# Patient Record
Sex: Female | Born: 1970
Health system: Southern US, Community
[De-identification: ages and names within clinical notes are randomized; demographics above are authoritative.]

## PROBLEM LIST (undated history)

## (undated) DIAGNOSIS — T4145XA Adverse effect of unspecified anesthetic, initial encounter: Secondary | ICD-10-CM

## (undated) DIAGNOSIS — R112 Nausea with vomiting, unspecified: Secondary | ICD-10-CM

## (undated) DIAGNOSIS — T8859XA Other complications of anesthesia, initial encounter: Secondary | ICD-10-CM

## (undated) DIAGNOSIS — Z9889 Other specified postprocedural states: Secondary | ICD-10-CM

## (undated) DIAGNOSIS — E559 Vitamin D deficiency, unspecified: Secondary | ICD-10-CM

## (undated) DIAGNOSIS — G43909 Migraine, unspecified, not intractable, without status migrainosus: Secondary | ICD-10-CM

## (undated) DIAGNOSIS — D329 Benign neoplasm of meninges, unspecified: Secondary | ICD-10-CM

## (undated) DIAGNOSIS — Z8489 Family history of other specified conditions: Secondary | ICD-10-CM

## (undated) DIAGNOSIS — S83289A Other tear of lateral meniscus, current injury, unspecified knee, initial encounter: Secondary | ICD-10-CM

## (undated) DIAGNOSIS — G35 Multiple sclerosis: Secondary | ICD-10-CM

## (undated) DIAGNOSIS — I493 Ventricular premature depolarization: Secondary | ICD-10-CM

## (undated) DIAGNOSIS — M2242 Chondromalacia patellae, left knee: Secondary | ICD-10-CM

## (undated) HISTORY — PX: WISDOM TOOTH EXTRACTION: SHX21

## (undated) HISTORY — PX: AUGMENTATION MAMMAPLASTY: SUR837

## (undated) HISTORY — DX: Benign neoplasm of meninges, unspecified: D32.9

## (undated) HISTORY — PX: BREAST ENHANCEMENT SURGERY: SHX7

## (undated) HISTORY — DX: Multiple sclerosis: G35

## (undated) HISTORY — DX: Vitamin D deficiency, unspecified: E55.9

## (undated) HISTORY — DX: Ventricular premature depolarization: I49.3

---

## 1997-07-19 ENCOUNTER — Ambulatory Visit (HOSPITAL_COMMUNITY): Admission: RE | Admit: 1997-07-19 | Discharge: 1997-07-19 | Payer: Self-pay

## 1997-11-03 ENCOUNTER — Ambulatory Visit (HOSPITAL_COMMUNITY): Admission: RE | Admit: 1997-11-03 | Discharge: 1997-11-03 | Payer: Self-pay | Admitting: Family Medicine

## 1998-01-05 ENCOUNTER — Ambulatory Visit (HOSPITAL_COMMUNITY): Admission: RE | Admit: 1998-01-05 | Discharge: 1998-01-05 | Payer: Self-pay

## 1999-03-09 ENCOUNTER — Encounter: Payer: Self-pay | Admitting: *Deleted

## 1999-03-09 ENCOUNTER — Ambulatory Visit (HOSPITAL_COMMUNITY): Admission: RE | Admit: 1999-03-09 | Discharge: 1999-03-09 | Payer: Self-pay | Admitting: *Deleted

## 1999-09-03 ENCOUNTER — Ambulatory Visit (HOSPITAL_COMMUNITY): Admission: RE | Admit: 1999-09-03 | Discharge: 1999-09-03 | Payer: Self-pay | Admitting: Neurosurgery

## 1999-09-03 ENCOUNTER — Encounter: Payer: Self-pay | Admitting: Neurosurgery

## 1999-11-08 ENCOUNTER — Ambulatory Visit (HOSPITAL_COMMUNITY): Admission: RE | Admit: 1999-11-08 | Discharge: 1999-11-08 | Payer: Self-pay

## 1999-11-14 ENCOUNTER — Other Ambulatory Visit: Admission: RE | Admit: 1999-11-14 | Discharge: 1999-11-14 | Payer: Self-pay | Admitting: Obstetrics & Gynecology

## 1999-12-01 ENCOUNTER — Ambulatory Visit (HOSPITAL_COMMUNITY): Admission: RE | Admit: 1999-12-01 | Discharge: 1999-12-01 | Payer: Self-pay | Admitting: Obstetrics & Gynecology

## 1999-12-01 ENCOUNTER — Encounter (INDEPENDENT_AMBULATORY_CARE_PROVIDER_SITE_OTHER): Payer: Self-pay

## 2000-02-01 ENCOUNTER — Ambulatory Visit (HOSPITAL_COMMUNITY): Admission: RE | Admit: 2000-02-01 | Discharge: 2000-02-01 | Payer: Self-pay | Admitting: *Deleted

## 2000-05-22 ENCOUNTER — Encounter (INDEPENDENT_AMBULATORY_CARE_PROVIDER_SITE_OTHER): Payer: Self-pay

## 2000-05-22 ENCOUNTER — Inpatient Hospital Stay (HOSPITAL_COMMUNITY): Admission: RE | Admit: 2000-05-22 | Discharge: 2000-05-25 | Payer: Self-pay | Admitting: Obstetrics & Gynecology

## 2000-05-22 HISTORY — PX: TOTAL VAGINAL HYSTERECTOMY: SHX2548

## 2000-06-26 ENCOUNTER — Ambulatory Visit (HOSPITAL_COMMUNITY): Admission: RE | Admit: 2000-06-26 | Discharge: 2000-06-26 | Payer: Self-pay | Admitting: Psychiatry

## 2000-06-26 ENCOUNTER — Encounter: Payer: Self-pay | Admitting: Psychiatry

## 2001-01-15 ENCOUNTER — Encounter: Payer: Self-pay | Admitting: Psychiatry

## 2001-01-15 ENCOUNTER — Ambulatory Visit (HOSPITAL_COMMUNITY): Admission: RE | Admit: 2001-01-15 | Discharge: 2001-01-15 | Payer: Self-pay | Admitting: Psychiatry

## 2002-06-28 ENCOUNTER — Ambulatory Visit (HOSPITAL_COMMUNITY): Admission: RE | Admit: 2002-06-28 | Discharge: 2002-06-28 | Payer: Self-pay | Admitting: Psychiatry

## 2002-06-28 ENCOUNTER — Encounter: Payer: Self-pay | Admitting: Psychiatry

## 2002-09-13 ENCOUNTER — Encounter: Payer: Self-pay | Admitting: Gastroenterology

## 2002-09-13 ENCOUNTER — Ambulatory Visit (HOSPITAL_COMMUNITY): Admission: RE | Admit: 2002-09-13 | Discharge: 2002-09-13 | Payer: Self-pay | Admitting: Gastroenterology

## 2002-09-22 ENCOUNTER — Encounter: Payer: Self-pay | Admitting: Gastroenterology

## 2002-09-22 ENCOUNTER — Ambulatory Visit (HOSPITAL_COMMUNITY): Admission: RE | Admit: 2002-09-22 | Discharge: 2002-09-22 | Payer: Self-pay | Admitting: Gastroenterology

## 2003-07-14 ENCOUNTER — Ambulatory Visit (HOSPITAL_COMMUNITY): Admission: RE | Admit: 2003-07-14 | Discharge: 2003-07-14 | Payer: Self-pay | Admitting: Psychiatry

## 2004-04-11 ENCOUNTER — Ambulatory Visit (HOSPITAL_COMMUNITY): Admission: RE | Admit: 2004-04-11 | Discharge: 2004-04-11 | Payer: Self-pay

## 2004-04-16 ENCOUNTER — Emergency Department (HOSPITAL_COMMUNITY): Admission: EM | Admit: 2004-04-16 | Discharge: 2004-04-16 | Payer: Self-pay | Admitting: Emergency Medicine

## 2004-05-25 ENCOUNTER — Ambulatory Visit (HOSPITAL_COMMUNITY): Admission: RE | Admit: 2004-05-25 | Discharge: 2004-05-25 | Payer: Self-pay | Admitting: Gastroenterology

## 2004-06-07 ENCOUNTER — Ambulatory Visit (HOSPITAL_COMMUNITY): Admission: RE | Admit: 2004-06-07 | Discharge: 2004-06-07 | Payer: Self-pay | Admitting: Internal Medicine

## 2004-10-09 ENCOUNTER — Ambulatory Visit (HOSPITAL_COMMUNITY): Admission: RE | Admit: 2004-10-09 | Discharge: 2004-10-09 | Payer: Self-pay | Admitting: Gastroenterology

## 2004-12-07 ENCOUNTER — Encounter: Payer: Self-pay | Admitting: Family Medicine

## 2005-03-11 ENCOUNTER — Ambulatory Visit (HOSPITAL_COMMUNITY): Admission: RE | Admit: 2005-03-11 | Discharge: 2005-03-11 | Payer: Self-pay | Admitting: Psychiatry

## 2005-03-21 ENCOUNTER — Ambulatory Visit (HOSPITAL_COMMUNITY): Admission: RE | Admit: 2005-03-21 | Discharge: 2005-03-21 | Payer: Self-pay | Admitting: Psychiatry

## 2006-03-24 ENCOUNTER — Ambulatory Visit (HOSPITAL_COMMUNITY): Admission: RE | Admit: 2006-03-24 | Discharge: 2006-03-24 | Payer: Self-pay | Admitting: Psychiatry

## 2006-05-08 ENCOUNTER — Emergency Department (HOSPITAL_COMMUNITY): Admission: EM | Admit: 2006-05-08 | Discharge: 2006-05-08 | Payer: Self-pay | Admitting: Emergency Medicine

## 2006-06-04 ENCOUNTER — Encounter: Admission: RE | Admit: 2006-06-04 | Discharge: 2006-06-17 | Payer: Self-pay | Admitting: Orthopedic Surgery

## 2006-12-01 ENCOUNTER — Ambulatory Visit (HOSPITAL_COMMUNITY): Admission: RE | Admit: 2006-12-01 | Discharge: 2006-12-01 | Payer: Self-pay

## 2007-01-06 ENCOUNTER — Ambulatory Visit (HOSPITAL_COMMUNITY): Admission: RE | Admit: 2007-01-06 | Discharge: 2007-01-06 | Payer: Self-pay | Admitting: Pediatrics

## 2007-01-08 ENCOUNTER — Ambulatory Visit (HOSPITAL_COMMUNITY): Admission: RE | Admit: 2007-01-08 | Discharge: 2007-01-08 | Payer: Self-pay | Admitting: Internal Medicine

## 2007-02-05 ENCOUNTER — Ambulatory Visit (HOSPITAL_COMMUNITY): Admission: RE | Admit: 2007-02-05 | Discharge: 2007-02-05 | Payer: Self-pay | Admitting: Internal Medicine

## 2007-03-25 ENCOUNTER — Ambulatory Visit (HOSPITAL_COMMUNITY): Admission: RE | Admit: 2007-03-25 | Discharge: 2007-03-25 | Payer: Self-pay | Admitting: Internal Medicine

## 2008-12-12 ENCOUNTER — Encounter: Admission: RE | Admit: 2008-12-12 | Discharge: 2008-12-12 | Payer: Self-pay | Admitting: Internal Medicine

## 2009-05-25 ENCOUNTER — Ambulatory Visit (HOSPITAL_COMMUNITY): Admission: RE | Admit: 2009-05-25 | Discharge: 2009-05-25 | Payer: Self-pay | Admitting: Gastroenterology

## 2009-05-30 ENCOUNTER — Ambulatory Visit (HOSPITAL_COMMUNITY): Admission: RE | Admit: 2009-05-30 | Discharge: 2009-05-30 | Payer: Self-pay | Admitting: Orthopedic Surgery

## 2009-06-01 ENCOUNTER — Ambulatory Visit (HOSPITAL_COMMUNITY): Admission: RE | Admit: 2009-06-01 | Discharge: 2009-06-02 | Payer: Self-pay | Admitting: Orthopedic Surgery

## 2009-06-01 HISTORY — PX: ORIF PROXIMAL TIBIAL PLATEAU FRACTURE: SUR953

## 2009-06-22 ENCOUNTER — Encounter: Admission: RE | Admit: 2009-06-22 | Discharge: 2009-09-13 | Payer: Self-pay | Admitting: Orthopedic Surgery

## 2009-09-19 ENCOUNTER — Encounter: Admission: RE | Admit: 2009-09-19 | Discharge: 2009-09-19 | Payer: Self-pay | Admitting: Obstetrics and Gynecology

## 2009-11-30 ENCOUNTER — Ambulatory Visit (HOSPITAL_BASED_OUTPATIENT_CLINIC_OR_DEPARTMENT_OTHER): Admission: RE | Admit: 2009-11-30 | Discharge: 2009-11-30 | Payer: Self-pay | Admitting: Orthopedic Surgery

## 2009-11-30 HISTORY — PX: KNEE HARDWARE REMOVAL: SUR1128

## 2010-02-13 ENCOUNTER — Ambulatory Visit: Payer: Self-pay | Admitting: Cardiology

## 2010-02-13 ENCOUNTER — Emergency Department (HOSPITAL_COMMUNITY): Admission: EM | Admit: 2010-02-13 | Discharge: 2010-02-13 | Payer: Self-pay | Admitting: Emergency Medicine

## 2010-02-14 ENCOUNTER — Encounter: Payer: Self-pay | Admitting: Cardiology

## 2010-02-14 ENCOUNTER — Telehealth: Payer: Self-pay | Admitting: Cardiology

## 2010-02-14 ENCOUNTER — Ambulatory Visit: Admission: RE | Admit: 2010-02-14 | Discharge: 2010-02-14 | Payer: Self-pay | Admitting: Cardiology

## 2010-02-20 DIAGNOSIS — D329 Benign neoplasm of meninges, unspecified: Secondary | ICD-10-CM

## 2010-02-20 DIAGNOSIS — R002 Palpitations: Secondary | ICD-10-CM | POA: Insufficient documentation

## 2010-02-20 DIAGNOSIS — Z8679 Personal history of other diseases of the circulatory system: Secondary | ICD-10-CM | POA: Insufficient documentation

## 2010-02-20 DIAGNOSIS — G35 Multiple sclerosis: Secondary | ICD-10-CM

## 2010-02-21 ENCOUNTER — Encounter: Payer: Self-pay | Admitting: Cardiology

## 2010-02-21 ENCOUNTER — Ambulatory Visit: Payer: Self-pay | Admitting: Cardiology

## 2010-02-21 DIAGNOSIS — E876 Hypokalemia: Secondary | ICD-10-CM

## 2010-02-23 ENCOUNTER — Ambulatory Visit: Payer: Self-pay | Admitting: Cardiology

## 2010-02-26 ENCOUNTER — Encounter: Payer: Self-pay | Admitting: Cardiology

## 2010-03-01 ENCOUNTER — Telehealth: Payer: Self-pay | Admitting: Cardiology

## 2010-03-01 LAB — CONVERTED CEMR LAB
BUN: 12 mg/dL
CO2: 32 meq/L
Calcium: 9.5 mg/dL
Chloride: 101 meq/L
Creatinine, Ser: 0.6 mg/dL
GFR calc non Af Amer: 109.73 mL/min
Glucose, Bld: 90 mg/dL
Potassium: 3.9 meq/L
Sodium: 140 meq/L

## 2010-04-14 ENCOUNTER — Encounter: Payer: Self-pay | Admitting: Diagnostic Radiology

## 2010-04-15 ENCOUNTER — Encounter: Payer: Self-pay | Admitting: Gastroenterology

## 2010-04-16 ENCOUNTER — Ambulatory Visit (HOSPITAL_COMMUNITY)
Admission: RE | Admit: 2010-04-16 | Discharge: 2010-04-16 | Payer: Self-pay | Source: Home / Self Care | Attending: Orthopedic Surgery | Admitting: Orthopedic Surgery

## 2010-04-24 NOTE — Progress Notes (Signed)
Summary: needs echo order faxed to [facility] appt today at 10a  Phone Note Call from Patient   Caller: Patient Reason for Call: Talk to Nurse Summary of Call: pt cxl echo here, will be out of town, works at [employer] and scheduled it there, can we fax an order to [phone] att :rich appt today at 10a Initial call taken by: Glynda Jaeger,  February 14, 2010 9:08 AM  Follow-up for Phone Call        Faxed to [facility]. Mylo Red RN

## 2010-04-24 NOTE — Progress Notes (Signed)
Summary: returning your call re lab  Phone Note Call from Patient Call back at Home Phone [phone]   Caller: Patient Reason for Call: Talk to Nurse, Lab or Test Results Summary of Call: pt returning you call re lab Initial call taken by: Roe Coombs,  March 01, 2010 9:11 AM  Follow-up for Phone Call        Pt aware of lab results.  She will await monitor results and is aware Dr. Daleen Squibb is out of the office.  Reassurance given regarding monitor. Mylo Red RN

## 2010-04-24 NOTE — Assessment & Plan Note (Signed)
Summary: eph/presyncope   Visit Type:  EPH Primary Provider:  [facility]   CC:  presyncope episode w/heart pounding rate about 120.Marland KitchenMarland KitchenMarland Kitchenpt states when she stands up she feels her heart rate increasing.....more headaches in the last month....denies any other complaints today.  History of Present Illness: Mrs Kwiecinski comes in today for followup after being seen in the emergency room for dizziness and palpitations.  At that time was felt that she was dehydrated and her monitor showed some PVCs. Her potassium was low at 3.3. Her history was consistent with poor eating habits trying to lose weight. She was also drinking 2 caffeinated beverages a day.  She was orthostatic as well.  Since discharge, she has focused on drinking more good fluids, cut her caffeine down to one a day and her palpitations have improved. She notices them when she stands up too quickly or if she is lying still particularly at night. She is not start an exercise program.  An echocardiogram as an outpatient was essentially normal. She had been called with these results.  She denies any chest discomfort, presyncope or syncope.  Current Medications (verified): 1)  Zofran 8 Mg Tabs (Ondansetron Hcl) .Marland Kitchen.. 1 Tab Q 6 Hour As Needed  Allergies (verified): 1)  ! Compazine 2)  ! Morphine  Past History:  Past Medical History: Last updated: 02/20/2010 SUPRAVENTRICULAR TACHYCARDIA, HX OF (ICD-V12.59) PALPITATIONS (ICD-785.1) BRAIN CANCER (ICD-191.9) MULTIPLE SCLEROSIS (ICD-340)  Past Surgical History: Last updated: 02/20/2010  1. Removal of deep implant right proximal tibia.  A 6.5-mm DePuy       cannulated screw with debridement of scar tissue.   2. Incisional debridement scar tissue and tendinous tissue, right       knee.  SURGEON:  Madlyn Frankel. Charlann Boxer, M.D.     Total vaginal hysterectomy. SURGEON: Freddy Finner, M.D.. Hysteroscopy; breast cyst removal Knee Arthroscopy  Family History: Last  updated: 02/20/2010 Her mother is 33.  Her father is 18.  Her father has a   heart murmur but neither her mother nor her father nor any siblings have any other cardiac issues.     Social History: Last updated: 02/20/2010 [place]   [place].  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  She has no   history of alcohol, tobacco or drug abuse.  She has recently gone off   caffeine and does not drink energy drinks.  She frequently uses meal   bars for breakfast or lunch.  She feels that she eats fairly healthy but   admits to trying to lose weight and also admits that she does not drink   very much water.  Since an injury in March of this year requiring two   different surgeries, she has not been exercising but is now trying to   increase her activity.      Review of Systems       negative other than history of present illness  Vital Signs:  Patient profile:   40 year old female Height:      69 inches Weight:      147.50 pounds BMI:     21.86 Pulse rate:   83 / minute Pulse (ortho):   90 / minute Pulse rhythm:   irregular BP sitting:   114 / 62  (left arm) BP standing:   110 / 78 Cuff size:   large  Vitals Entered By: Danielle Rankin, CMA (February 21, 2010 12:30 PM)  Serial Vital Signs/Assessments:  Time      Position  BP       Pulse  Resp  Temp     By 12:40 PM  Lying [place]  110/74   70                    Danielle Rankin, CMA 12:41 PM  Sitting   115/81   [address] [place] 12:42 PM  Standing  110/78   90                    Danielle Rankin, [place] 12:44 PM  Standing  115/82   [address], [place] 12:45 PM  Standing  11/81    94                    Danielle Rankin, [place]  Comments: 12:40 PM no sxms By: Danielle Rankin, CMA  12:41 PM no sxms By: Danielle Rankin, CMA  12:42 PM little dizzy/heart pounding By: Danielle Rankin, CMA  12:44 PM little dizzy/heart pounding By: Danielle Rankin, CMA  12:45 PM little dizzy/heart  pounding By: Danielle Rankin, CMA    Physical Exam  General:  Well developed, well nourished, in no acute distress. Head:  normocephalic and atraumatic Eyes:  PERRLA/EOM intact; conjunctiva and lids normal. Neck:  Neck supple, no JVD. No masses, thyromegaly or abnormal cervical nodes. Chest Wall:  no deformities or breast masses noted Lungs:  Clear bilaterally to auscultation and percussion. Heart:  MI nondisplaced, normal S1-S2, no murmur or gallop or click, carotid strokes equal bilaterally without bruits Abdomen:  Bowel sounds positive; abdomen soft and non-tender without masses, organomegaly, or hernias noted. No hepatosplenomegaly. Msk:  Back normal, normal gait. Muscle strength and tone normal. Pulses:  pulses normal in all 4 extremities Extremities:  No clubbing or cyanosis. Neurologic:  Alert and oriented x 3. Skin:  Intact without lesions or rashes. Psych:  Normal affect.   Impression & Recommendations:  Problem # 1:  HYPOKALEMIA (ICD-276.8) Will recheck her potassium today. She's been eating more balanced diet and eating some potassium rich food each day. Orders: TLB-BMP (Basic Metabolic Panel-BMET) (80048-METABOL)  Problem # 2:  PALPITATIONS (ICD-785.1) Will obtain Holter monitor to correlate symptoms with rhythm. Orders: EKG w/ Interpretation (93000) Holter Monitor (Holter Monitor)  Problem # 3:  SUPRAVENTRICULAR TACHYCARDIA, HX OF (ICD-V12.59) Assessment: Unchanged  Patient Instructions: 1)  Your physician recommends that you schedule a follow-up appointment in:  2)  Your physician recommends that you continue on your current medications as directed. Please refer to the Current Medication list given to you today. 3)  Your physician has recommended that you wear a holter monitor.  Holter monitors are medical devices that record the heart's electrical activity. Doctors most often use these monitors to diagnose arrhythmias. Arrhythmias are problems with the speed or  rhythm of the heartbeat. The monitor is a small, portable device. You can wear one while you do your normal daily activities. This is usually used to diagnose what is causing palpitations/syncope (passing out). 4)  Your physician recommends that you have  lab work today [employer]

## 2010-04-26 NOTE — Procedures (Signed)
Summary: summary report  summary report   Imported By: Mirna Mires 03/09/2010 09:45:49  _____________________________________________________________________  External Attachment:    Type:   Image     Comment:   External Document

## 2010-05-07 ENCOUNTER — Inpatient Hospital Stay (INDEPENDENT_AMBULATORY_CARE_PROVIDER_SITE_OTHER)
Admission: RE | Admit: 2010-05-07 | Discharge: 2010-05-07 | Disposition: A | Discharge status: Home / Self Care | Payer: Commercial Managed Care - PPO | Source: Ambulatory Visit | Attending: Family Medicine | Admitting: Family Medicine

## 2010-05-07 DIAGNOSIS — R05 Cough: Secondary | ICD-10-CM

## 2010-05-07 DIAGNOSIS — J019 Acute sinusitis, unspecified: Secondary | ICD-10-CM

## 2010-05-07 DIAGNOSIS — R059 Cough, unspecified: Secondary | ICD-10-CM

## 2010-06-05 LAB — POCT I-STAT, CHEM 8
BUN: 13 mg/dL (ref 6–23)
Chloride: 105 mEq/L (ref 96–112)
Creatinine, Ser: 0.6 mg/dL (ref 0.4–1.2)
HCT: 44 % (ref 36.0–46.0)
Hemoglobin: 15 g/dL (ref 12.0–15.0)
Sodium: 142 mEq/L (ref 135–145)

## 2010-06-05 LAB — DIFFERENTIAL
Basophils Relative: 0 % (ref 0–1)
Eosinophils Absolute: 0.1 10*3/uL (ref 0.0–0.7)
Lymphs Abs: 2 10*3/uL (ref 0.7–4.0)
Monocytes Absolute: 0.7 10*3/uL (ref 0.1–1.0)
Neutro Abs: 5.4 10*3/uL (ref 1.7–7.7)
Neutrophils Relative %: 66 % (ref 43–77)

## 2010-06-05 LAB — URINALYSIS, ROUTINE W REFLEX MICROSCOPIC
Bilirubin Urine: NEGATIVE
Nitrite: NEGATIVE
Protein, ur: NEGATIVE mg/dL
Urobilinogen, UA: 0.2 mg/dL (ref 0.0–1.0)

## 2010-06-05 LAB — CBC
Hemoglobin: 14 g/dL (ref 12.0–15.0)
MCHC: 34.2 g/dL (ref 30.0–36.0)

## 2010-06-05 LAB — POCT CARDIAC MARKERS

## 2010-06-17 LAB — URINALYSIS, ROUTINE W REFLEX MICROSCOPIC
Bilirubin Urine: NEGATIVE
Hgb urine dipstick: NEGATIVE
Ketones, ur: NEGATIVE mg/dL
Nitrite: NEGATIVE
Urobilinogen, UA: 0.2 mg/dL (ref 0.0–1.0)
pH: 5.5 (ref 5.0–8.0)

## 2010-06-17 LAB — DIFFERENTIAL
Basophils Absolute: 0 10*3/uL (ref 0.0–0.1)
Basophils Relative: 0 % (ref 0–1)
Neutro Abs: 5.5 10*3/uL (ref 1.7–7.7)
Neutrophils Relative %: 67 % (ref 43–77)

## 2010-06-17 LAB — BASIC METABOLIC PANEL
Calcium: 9.2 mg/dL (ref 8.4–10.5)
Chloride: 107 mEq/L (ref 96–112)
GFR calc non Af Amer: >60 mL/min (ref >60)
Glucose, Bld: 72 mg/dL (ref 70–99)
Potassium: 4.3 mEq/L (ref 3.5–5.1)
Sodium: 141 mEq/L (ref 135–145)

## 2010-06-17 LAB — APTT: aPTT: 31 seconds (ref 24–37)

## 2010-06-17 LAB — PROTIME-INR: INR: 1.03 (ref 0.00–1.49)

## 2010-06-17 LAB — CBC
HCT: 40.6 % (ref 36.0–46.0)
Platelets: 342 10*3/uL (ref 150–400)
RDW: 12.7 % (ref 11.5–15.5)

## 2010-07-31 ENCOUNTER — Other Ambulatory Visit: Payer: Self-pay | Admitting: Radiation Oncology

## 2010-07-31 DIAGNOSIS — R52 Pain, unspecified: Secondary | ICD-10-CM

## 2010-08-01 ENCOUNTER — Other Ambulatory Visit (HOSPITAL_COMMUNITY): Payer: Commercial Managed Care - PPO

## 2010-08-10 NOTE — Discharge Summary (Signed)
[facility]  Patient:    Ashley May, Ashley May                     MRN: [id number] Adm. Date:  [id number] Disc. Date: [id number] Attending:  Minette Headland                           Discharge Summary  ADMISSION DIAGNOSIS:          Suspected adenomyosis because of persistent menorrhea, dysfunction uterine bleeding, postcoital bleeding, and abdominal and pelvic pain, not confirmed histologically.  SECONDARY DIAGNOSIS:          Multiple sclerosis.  OPERATIONS:                   Total vaginal hysterectomy.  POSTOPERATIVE COMPLICATIONS:  Dizziness of uncertain etiology, entirely possibly secondary to MS since her Betaseron injection was delayed.  Other postoperative complications:  None.  CONDITION ON DISCHARGE:       At the time of her discharge she was in satisfactory and improved condition.  Her vertigo-like symptoms had improved on Transderm scopolamine and antihistamine p.o.  DISCHARGE INSTRUCTIONS:       She was to have progressively increasing physical activity.  To restart her Betaseron.  She was to take a regular diet.  MEDICATIONS:                  She was given Percocet to be taken as needed for pain.  FOLLOW-UP:                    She is to return to the office in 7-10 days for her first postoperative visit.  Details of the present illness, past history, family history, review of systems, and physical exam are recorded in the admission note and in the operative summary.  The patient had clinical symptoms which had not improved with conservative treatment.  She had requested definitive intervention and was admitted at this time for a hysterectomy.  LABORATORY DATA:              Preoperative hemoglobin of 13.4, postoperative hemoglobin of 12.7.  HOSPITAL COURSE:              The patient was admitted on the morning of surgery.  She was placed in ______ hose.  She was taken to the operating room after receiving a 1-gram bolus of Cefotan  IV, and the above-described procedure was accomplished without difficulty.  Her postoperative course was uncomplicated except for the dizziness noted above.  This did respond slowly to antihistamines by mouth and to Transderm scopolamine skin patches.  I was uncertain of whether this was a medication reaction, a problem with her MS due to her delayed routine Betaseron, or a simple vertigo kind of problem.  She did, however, respond to fairly conservative measures and, by the second postoperative day, was in satisfactory condition for discharge. DD:  06/12/00 TD:  06/13/00 Job: [id number]

## 2010-08-10 NOTE — Op Note (Signed)
[facility]  Patient:    Ashley May, Ashley May                     MRN: [id number] Proc. Date: 05/22/00 Adm. Date:  [id number] Attending:  Minette Headland                           Operative Report  PREOPERATIVE DIAGNOSIS:       Uterine enlargement, probable adenomyosis. Clinical symptoms of dysfunctional uterine bleeding, plus postcoital bleeding and abdominal and pelvic pain.  POSTOPERATIVE DIAGNOSIS:      Uterine enlargement, probable adenomyosis. Clinical symptoms of dysfunctional uterine bleeding, plus postcoital bleeding and abdominal and pelvic pain.  OPERATION:                    Total vaginal hysterectomy.  SURGEON:                      Freddy Finner, M.D..  ASSISTANT:                    ______ .  ANESTHESIA:                   General.  INTRAOPERATIVE BLOOD LOSS:    Less than or equal to 100 cc.  INTRAOPERATIVE COMPLICATIONS: None.  DESCRIPTION OF PROCEDURE:     Details of the present illness recorded in the admission note.  The patient was admitted on the morning of surgery.  She was given an antibiotic bolus preoperatively.  She was taken to the operating room after being placed in compression hose.  She was placed under adequate general endotracheal anesthesia, placed in the dorsolithotomy position, and prep of the perineum, vagina, and lower abdomen was carried out in the usual fashion with scrub-in solution.  The sterile drapes were applied.  Posterior weighted vaginal retractor was placed.  The cervix was grasped with a Christella Hartigan tenaculum. Colpotomy incision was made posterior to the cervix while tilting the mucosa with an Allis.  This entered the cul-de-sac.  There was a small amount of clear fluid in that location.  The cervix was circumscribed with a scalpel to free the mucosa.  Using the ligature sutureless Haney clamp the following pedicles were developed and sealed the uterosacrals: the bladder pillars, the cardinal  ligament pedicles, one on each side.  Anterior peritoneum was entered.  Vessel pedicles were taken with the sutureless clamp and divided.  Additional pack was taken by the vessels on each side.  This, too, was sharply divided after sealing the pedicle with the ligature system.  The uterus was delivered through the vaginal introitus.  The left utero-ovarian ligament was cross-clamped with the ligature, sealed, and then divided.  Free tie was placed around this pedicle before releasing it. The right side was then done in an identical fashion and the uterus completely removed.  Some bleeding was encountered on the left infundibulopelvic just inferior to the utero-ovarian pedicle, and this was controlled with the Bovie and/or the ligature system.  Angles of vagina were then anchored to uterosacrals.  Uterosacrals were plicated with a 0-Monocryl suture, and the posterior perineum closed with this also.  Please note that 0-Monocryl was used for all the suturing.  The cuff was then closed vertically with ______ of 0-Monocryl.  Foley catheter was placed.  The patient was awakened, taken to recovery in good condition. DD:  05/22/00 TD:  05/22/00 Job: [id number]

## 2010-08-10 NOTE — H&P (Signed)
[facility]  Patient:    Ashley May, Ashley May                       MRN: [id number] Adm. Date:  05/22/00 Attending:  Freddy Finner, M.D.                         History and Physical  ADMITTING DIAGNOSES:          Uterine enlargement most consistent with adenomyosis, persistent menorrhagia, dysfunctional uterine bleeding, postcoital bleeding, and abdominal and pelvic pain.  HISTORY OF PRESENT ILLNESS:   The patient is a 40 year old, gravida 4, para 2, white marred female who has had a history of dysfunctional bleeding and had a pelvic MRI with unusual cystic pelvic mass which was not confirmed by ultrasound or by laparoscopy in the very recent past. She does, however, have a history of dysfunctional uterine bleeding, postcoital bleeding and dysmenorrhea and has slight enlargement of the uterus on bimanual exam with slight irregularity of the uterus and uterine tenderness. Based on her continued clinical symptoms and the fact that her husband has had a vasectomy and she does not plan any further pregnancies, she has requested definitive surgical intervention and is admitted now for a vaginal hysterectomy. She has reviewed the video in the office describing the operative procedure including the potential risks of the procedure and is prepared to proceed.  REVIEW OF SYSTEMS:            Her current review of systems is otherwise negative. she has no other coronary, pulmonary, GI, or GU complaints.  PAST MEDICAL HISTORY:         The patient is noted to have MS with diagnosis made in 1994. She had a brain tumor with diagnosis in December of 2000.  She has no other known significant medical illnesses. She has had a therapeutic abortion in 1995. She has given birth to two children. She has had the laparoscopy noted above. She has had no other surgical procedures.   CURRENT MEDICATIONS:          Prozac 20 mg a day. Betaseron 8,000,000 units every other day and  Provigil 200 mg a day.  FAMILY HISTORY:               Noncontributory.  PHYSICAL EXAMINATION:  HEENT:                        Grossly within normal limits.  NECK:                         Thyroid gland is not palpably enlarged.  VITAL SIGNS:                  Blood pressure in the office was 110/64.  BREASTS:                      Normal. No palpable masses, no nipple discharge.  CHEST:                        Clear to auscultation.  HEART:                        Normal sinus rhythm without murmurs, rubs or gallops.  ABDOMEN:  Soft and nontender without appreciable organomegaly or palpable masses.  EXTREMITIES:                  Without cyanosis, clubbing or edema.  PELVIC:                       External genitalia, vagina, and cervix are normal. Bimanual reveals the uterus to be slightly enlarged, slightly irregular and mildly tender.  Adnexa are not palpably enlarged. There are no palpable masses. The rectum and rectovaginal exam confirm these findings.  ASSESSMENT:                   Uterine enlargement and suspected uterine adenomyosis.  PLAN:                         Total vaginal hysterectomy. DD:  05/21/00 TD:  05/21/00 Job: [id number]

## 2010-09-24 ENCOUNTER — Inpatient Hospital Stay (INDEPENDENT_AMBULATORY_CARE_PROVIDER_SITE_OTHER)
Admission: RE | Admit: 2010-09-24 | Discharge: 2010-09-24 | Disposition: A | Discharge status: Home / Self Care | Payer: Commercial Managed Care - PPO | Source: Ambulatory Visit | Attending: Family Medicine | Admitting: Family Medicine

## 2010-09-24 DIAGNOSIS — R05 Cough: Secondary | ICD-10-CM

## 2010-09-24 DIAGNOSIS — J309 Allergic rhinitis, unspecified: Secondary | ICD-10-CM

## 2010-10-08 ENCOUNTER — Inpatient Hospital Stay (INDEPENDENT_AMBULATORY_CARE_PROVIDER_SITE_OTHER)
Admission: RE | Admit: 2010-10-08 | Discharge: 2010-10-08 | Disposition: A | Discharge status: Home / Self Care | Payer: Commercial Managed Care - PPO | Source: Ambulatory Visit | Attending: Emergency Medicine | Admitting: Emergency Medicine

## 2010-10-08 ENCOUNTER — Ambulatory Visit (INDEPENDENT_AMBULATORY_CARE_PROVIDER_SITE_OTHER): Payer: Commercial Managed Care - PPO

## 2010-10-08 DIAGNOSIS — R05 Cough: Secondary | ICD-10-CM

## 2010-10-08 DIAGNOSIS — J3489 Other specified disorders of nose and nasal sinuses: Secondary | ICD-10-CM

## 2010-11-05 ENCOUNTER — Encounter: Payer: Self-pay | Admitting: Pulmonary Disease

## 2010-11-05 ENCOUNTER — Ambulatory Visit (INDEPENDENT_AMBULATORY_CARE_PROVIDER_SITE_OTHER): Payer: Commercial Managed Care - PPO | Admitting: Pulmonary Disease

## 2010-11-05 VITALS — BP 122/74 | HR 86 | Temp 98.0°F | Ht 69.0 in | Wt 143.4 lb

## 2010-11-05 DIAGNOSIS — R053 Chronic cough: Secondary | ICD-10-CM

## 2010-11-05 DIAGNOSIS — R05 Cough: Secondary | ICD-10-CM

## 2010-11-05 MED ORDER — BENZONATATE 100 MG PO CAPS: 200.0000 mg | ORAL_CAPSULE | Freq: Four times a day (QID) | ORAL | Status: AC | PRN

## 2010-11-05 MED ORDER — HYDROCOD POLST-CPM POLST ER 10-8 MG PO CP12: 1.0000 | ORAL_CAPSULE | Freq: Two times a day (BID) | ORAL | Status: DC | PRN

## 2010-11-05 NOTE — Assessment & Plan Note (Addendum)
The patient has a chronic cough of 2 months duration that is more upper airway than lower in origin.  The patient has no history of asthma, has clear lung fields today, has had a clear chest x-ray last month.  There is nothing to suggest overtly cough variant asthma.  At this point, I would like to treat this as an upper airway cough that I suspect is due to a cyclical cough mechanism.  We'll also treat aggressively for postnasal drip and also possible laryngopharyngeal reflux as contributors.  The patient has had an exam by ENT that showed significant upper airway inflammation.  I think she would benefit from aggressive cough suppression, as well as the behavioral therapies that will limit throat clearing and a cyclical cough.

## 2010-11-05 NOTE — Progress Notes (Signed)
  Subjective:    Patient ID: Ashley May, female    DOB: 1970-09-11, 40 y.o.   MRN: [id number]  HPI The patient is a 40 year old female who comes in today as a self-referral for evaluation of chronic cough.  Patient states her cough started in mid June, and because of sinus pressure thought it was related to possible sinusitis.  She took amoxicillin that was on hand from other family members, but ultimately went to urgent care where she was treated with Tussionex, Allegra, and another round of amoxicillin.  She did not have any change in her cough, and in fact she states it worsened.  She had a chest x-ray which was unremarkable.  She was ultimately seen by ENT, where she tells me that an upper airway exam showed inflammatory changes in her larynx with vocal cord edema.  She states he also mentioned a possible cellulitis in her nasal cavity.  She was treated with Septra, prednisone, as well as dulera.  She was also placed on omeprazole at 20 mg a day.  The patient states with this that she is about 20% better, and then started losing her voice, developing hoarseness, and had worsening of her cough that was primarily dry with occasional nonpurulent mucus.  She describes a classic globus sensation which leads to continuous throat clearing.  She tells me that her cough is currently a 6-7 out of a possible 10.  She denies any significant shortness of breath, and has no history of prior asthma.   Review of Systems  Constitutional: Positive for appetite change and unexpected weight change. Negative for fever.  HENT: Positive for sore throat and trouble swallowing. Negative for ear pain, nosebleeds, congestion, rhinorrhea, sneezing, dental problem, postnasal drip and sinus pressure.   Eyes: Negative for redness and itching.  Respiratory: Positive for cough and shortness of breath. Negative for chest tightness and wheezing.   Cardiovascular: Positive for palpitations. Negative for leg swelling.    Gastrointestinal: Negative for nausea and vomiting.  Genitourinary: Negative for dysuria.  Musculoskeletal: Negative for joint swelling.  Skin: Negative for rash.  Neurological: Negative for headaches.  Hematological: Does not bruise/bleed easily.  Psychiatric/Behavioral: Negative for dysphoric mood. The patient is not nervous/anxious.        Objective:   Physical Exam Constitutional:  Well developed, no acute distress  HENT:  Nares patent without discharge  Oropharynx without exudate, palate and uvula are normal  Eyes:  Perrla, eomi, no scleral icterus  Neck:  No JVD, no TMG  Cardiovascular:  Normal rate, regular rhythm, no rubs or gallops.  No murmurs        Intact distal pulses  Pulmonary :  Normal breath sounds, no stridor or respiratory distress   No rales, rhonchi, or wheezing  Abdominal:  Soft, nondistended, bowel sounds present.  No tenderness noted.   Musculoskeletal:  No lower extremity edema noted.  Lymph Nodes:  No cervical lymphadenopathy noted  Skin:  No cyanosis noted  Neurologic:  Alert, appropriate, moves all 4 extremities without obvious deficit.         Assessment & Plan:

## 2010-11-05 NOTE — Patient Instructions (Signed)
Stop omeprazole, and in its place take dexilant 60mg  one each am before breakfast Start cyclical cough protocol when you have 3 days to do the completed protocol.  In the meantime, work on the behavioral therapies we discussed:  No throat clearing, limit voice use, hard candy all day, tessalon pearls as needed. Please call me one week after finishing protocol to let me know how things are going.

## 2010-11-06 ENCOUNTER — Encounter: Payer: Self-pay | Admitting: Pulmonary Disease

## 2011-12-23 ENCOUNTER — Other Ambulatory Visit: Payer: Self-pay | Admitting: Internal Medicine

## 2011-12-23 DIAGNOSIS — Z1231 Encounter for screening mammogram for malignant neoplasm of breast: Secondary | ICD-10-CM

## 2012-01-17 ENCOUNTER — Ambulatory Visit
Admission: RE | Admit: 2012-01-17 | Discharge: 2012-01-17 | Disposition: A | Discharge status: Home / Self Care | Payer: 59 | Source: Ambulatory Visit | Attending: Internal Medicine | Admitting: Internal Medicine

## 2012-01-17 DIAGNOSIS — Z1231 Encounter for screening mammogram for malignant neoplasm of breast: Secondary | ICD-10-CM

## 2012-01-20 ENCOUNTER — Encounter: Payer: Self-pay | Admitting: *Deleted

## 2012-05-09 ENCOUNTER — Other Ambulatory Visit: Payer: Self-pay

## 2013-01-04 ENCOUNTER — Ambulatory Visit: Payer: 59 | Admitting: Internal Medicine

## 2013-01-14 ENCOUNTER — Ambulatory Visit: Payer: 59 | Admitting: Internal Medicine

## 2013-01-28 ENCOUNTER — Other Ambulatory Visit: Payer: Self-pay

## 2013-11-17 ENCOUNTER — Telehealth: Payer: Self-pay | Admitting: *Deleted

## 2013-11-17 ENCOUNTER — Ambulatory Visit (INDEPENDENT_AMBULATORY_CARE_PROVIDER_SITE_OTHER): Payer: Commercial Managed Care - PPO | Admitting: Family Medicine

## 2013-11-17 VITALS — BP 112/80 | HR 83 | Temp 98.3°F | Resp 16 | Ht 67.5 in | Wt 135.2 lb

## 2013-11-17 DIAGNOSIS — R05 Cough: Secondary | ICD-10-CM

## 2013-11-17 DIAGNOSIS — R059 Cough, unspecified: Secondary | ICD-10-CM

## 2013-11-17 DIAGNOSIS — H9203 Otalgia, bilateral: Secondary | ICD-10-CM

## 2013-11-17 DIAGNOSIS — R0789 Other chest pain: Secondary | ICD-10-CM

## 2013-11-17 DIAGNOSIS — R5381 Other malaise: Secondary | ICD-10-CM

## 2013-11-17 DIAGNOSIS — R5383 Other fatigue: Secondary | ICD-10-CM

## 2013-11-17 DIAGNOSIS — H9209 Otalgia, unspecified ear: Secondary | ICD-10-CM

## 2013-11-17 MED ORDER — AZITHROMYCIN 250 MG PO TABS: ORAL_TABLET | ORAL | Status: DC

## 2013-11-17 MED ORDER — HYDROCODONE-HOMATROPINE 5-1.5 MG/5ML PO SYRP: 5.0000 mL | ORAL_SOLUTION | Freq: Three times a day (TID) | ORAL | Status: DC | PRN

## 2013-11-17 NOTE — Telephone Encounter (Signed)
Spoke with Dr Nicki Reaper.  Ok to schedule as New Patient.

## 2013-11-17 NOTE — Progress Notes (Signed)
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 It began last Friday with sore throat and progressive involving sinus congestion and now a deep cough which is keeping her awake. She's had a mild low-grade temperature during this time. She's not been vomiting.  Patient has a history of multiple sclerosis for 21 years that she's done every well with this. She's had some headaches in the past for which he occasionally takes Topamax. Patient currently has an occipital headache for which she's taking Excedrin migraine and seemed to be helping somewhat. He's had no nausea or vomiting.  Objective: Attractive young adult in no acute distress with intermittent coughing. HEENT: Mild erythema of the throat, normal TMs, swollen nasal passages which are erythematous Neck: Supple no adenopathy, no thyromegaly Chest: Few rales at the bases otherwise no wheezes or rhonchi Heart: Regular no murmur Skin: Warm and dry without rash  Assessment: Ear pain, bilateral - Plan: azithromycin (ZITHROMAX Z-PAK) 250 MG tablet, HYDROcodone-homatropine (HYCODAN) 5-1.5 MG/5ML syrup  Cough - Plan: azithromycin (ZITHROMAX Z-PAK) 250 MG tablet, HYDROcodone-homatropine (HYCODAN) 5-1.5 MG/5ML syrup  Other malaise and fatigue  Other chest pain  Signed, Robyn Haber, MD    .

## 2013-11-17 NOTE — Telephone Encounter (Signed)
Pt called requesting ACUTE appoint.  Review of chart it appears pt has not been seen since 2008 by Dr Nicki Reaper.  Pt advised she is now considered a new patient.  She requests to be scheduled with Dr Nicki Reaper as a new patient again.  Please advise

## 2013-11-17 NOTE — Patient Instructions (Signed)
Sinusitis Sinusitis is redness, soreness, and inflammation of the paranasal sinuses. Paranasal sinuses are air pockets within the bones of your face (beneath the eyes, the middle of the forehead, or above the eyes). In healthy paranasal sinuses, mucus is able to drain out, and air is able to circulate through them by way of your nose. However, when your paranasal sinuses are inflamed, mucus and air can become trapped. This can allow bacteria and other germs to grow and cause infection. Sinusitis can develop quickly and last only a short time (acute) or continue over a long period (chronic). Sinusitis that lasts for more than 12 weeks is considered chronic.  CAUSES  Causes of sinusitis include:  Allergies.  Structural abnormalities, such as displacement of the cartilage that separates your nostrils (deviated septum), which can decrease the air flow through your nose and sinuses and affect sinus drainage.  Functional abnormalities, such as when the small hairs (cilia) that line your sinuses and help remove mucus do not work properly or are not present. SIGNS AND SYMPTOMS  Symptoms of acute and chronic sinusitis are the same. The primary symptoms are pain and pressure around the affected sinuses. Other symptoms include:  Upper toothache.  Earache.  Headache.  Bad breath.  Decreased sense of smell and taste.  A cough, which worsens when you are lying flat.  Fatigue.  Fever.  Thick drainage from your nose, which often is green and may contain pus (purulent).  Swelling and warmth over the affected sinuses. DIAGNOSIS  Your health care provider will perform a physical exam. During the exam, your health care provider may:  Look in your nose for signs of abnormal growths in your nostrils (nasal polyps).  Tap over the affected sinus to check for signs of infection.  View the inside of your sinuses (endoscopy) using an imaging device that has a light attached (endoscope). If your health  care provider suspects that you have chronic sinusitis, one or more of the following tests may be recommended:  Allergy tests.  Nasal culture. A sample of mucus is taken from your nose, sent to a lab, and screened for bacteria.  Nasal cytology. A sample of mucus is taken from your nose and examined by your health care provider to determine if your sinusitis is related to an allergy. TREATMENT  Most cases of acute sinusitis are related to a viral infection and will resolve on their own within 10 days. Sometimes medicines are prescribed to help relieve symptoms (pain medicine, decongestants, nasal steroid sprays, or saline sprays).  However, for sinusitis related to a bacterial infection, your health care provider will prescribe antibiotic medicines. These are medicines that will help kill the bacteria causing the infection.  Rarely, sinusitis is caused by a fungal infection. In theses cases, your health care provider will prescribe antifungal medicine. For some cases of chronic sinusitis, surgery is needed. Generally, these are cases in which sinusitis recurs more than 3 times per year, despite other treatments. HOME CARE INSTRUCTIONS   Drink plenty of water. Water helps thin the mucus so your sinuses can drain more easily.  Use a humidifier.  Inhale steam 3 to 4 times a day (for example, sit in the bathroom with the shower running).  Apply a warm, moist washcloth to your face 3 to 4 times a day, or as directed by your health care provider.  Use saline nasal sprays to help moisten and clean your sinuses.  Take medicines only as directed by your health care provider.    If you were prescribed either an antibiotic or antifungal medicine, finish it all even if you start to feel better. °SEEK IMMEDIATE MEDICAL CARE IF: °· You have increasing pain or severe headaches. °· You have nausea, vomiting, or drowsiness. °· You have swelling around your face. °· You have vision problems. °· You have a stiff  neck. °· You have difficulty breathing. °MAKE SURE YOU:  °· Understand these instructions. °· Will watch your condition. °· Will get help right away if you are not doing well or get worse. °Document Released: 03/11/2005 Document Revised: 07/26/2013 Document Reviewed: 03/26/2011 °ExitCare® Patient Information ©2015 [employer]. This information is not intended to replace advice given to you by your health care provider. Make sure you discuss any questions you have with your health care provider. °Acute Bronchitis °Bronchitis is inflammation of the airways that extend from the windpipe into the lungs (bronchi). The inflammation often causes mucus to develop. This leads to a cough, which is the most common symptom of bronchitis.  °In acute bronchitis, the condition usually develops suddenly and goes away over time, usually in a couple weeks. Smoking, allergies, and asthma can make bronchitis worse. Repeated episodes of bronchitis may cause further lung problems.  °CAUSES °Acute bronchitis is most often caused by the same virus that causes a cold. The virus can spread from person to person (contagious) through coughing, sneezing, and touching contaminated objects. °SIGNS AND SYMPTOMS  °· Cough.   °· Fever.   °· Coughing up mucus.   °· Body aches.   °· Chest congestion.   °· Chills.   °· Shortness of breath.   °· Sore throat.   °DIAGNOSIS  °Acute bronchitis is usually diagnosed through a physical exam. Your health care provider will also ask you questions about your medical history. Tests, such as chest X-rays, are sometimes done to rule out other conditions.  °TREATMENT  °Acute bronchitis usually goes away in a couple weeks. Oftentimes, no medical treatment is necessary. Medicines are sometimes given for relief of fever or cough. Antibiotic medicines are usually not needed but may be prescribed in certain situations. In some cases, an inhaler may be recommended to help reduce shortness of breath and control the cough.  A cool mist vaporizer may also be used to help thin bronchial secretions and make it easier to clear the chest.  °HOME CARE INSTRUCTIONS °· Get plenty of rest.   °· Drink enough fluids to keep your urine clear or pale yellow (unless you have a medical condition that requires fluid restriction). Increasing fluids may help thin your respiratory secretions (sputum) and reduce chest congestion, and it will prevent dehydration.   °· Take medicines only as directed by your health care provider. °· If you were prescribed an antibiotic medicine, finish it all even if you start to feel better. °· Avoid smoking and secondhand smoke. Exposure to cigarette smoke or irritating chemicals will make bronchitis worse. If you are a smoker, consider using nicotine gum or skin patches to help control withdrawal symptoms. Quitting smoking will help your lungs heal faster.   °· Reduce the chances of another bout of acute bronchitis by washing your hands frequently, avoiding people with cold symptoms, and trying not to touch your hands to your mouth, nose, or eyes.   °· Keep all follow-up visits as directed by your health care provider.   °SEEK MEDICAL CARE IF: °Your symptoms do not improve after 1 week of treatment.  °SEEK IMMEDIATE MEDICAL CARE IF: °· You develop an increased fever or chills.   °· You have chest pain.   °·   You have severe shortness of breath. °· You have bloody sputum.   °· You develop dehydration. °· You faint or repeatedly feel like you are going to pass out. °· You develop repeated vomiting. °· You develop a severe headache. °MAKE SURE YOU:  °· Understand these instructions. °· Will watch your condition. °· Will get help right away if you are not doing well or get worse. °Document Released: 04/18/2004 Document Revised: 07/26/2013 Document Reviewed: 09/01/2012 °ExitCare® Patient Information ©2015 [employer]. This information is not intended to replace advice given to you by your health care provider. Make sure you  discuss any questions you have with your health care provider. ° °

## 2013-11-17 NOTE — Telephone Encounter (Signed)
Who has she been seeing (who is her pcp now).  If having acute issue, will need to be seen by current physician or acute care, etc.  If wanting to establish care, this will be a good way out - in scheduling.  Need more information.

## 2013-11-20 ENCOUNTER — Telehealth: Payer: Self-pay

## 2013-11-20 DIAGNOSIS — R05 Cough: Secondary | ICD-10-CM

## 2013-11-20 DIAGNOSIS — R059 Cough, unspecified: Secondary | ICD-10-CM

## 2013-11-20 NOTE — Telephone Encounter (Signed)
Pt is not feeling any better and would like to talk with someone about what else should be done with out coming back in

## 2013-11-21 MED ORDER — PREDNISONE 20 MG PO TABS: 40.0000 mg | ORAL_TABLET | Freq: Every day | ORAL | Status: DC

## 2013-11-21 NOTE — Telephone Encounter (Signed)
Pt has taken all of the zpak. Still feels bad. Cough syrup not helping, still coughing and still has a HA. Please advise.

## 2013-11-21 NOTE — Telephone Encounter (Signed)
Prednisone sent in by Dr. Carlean Jews. Pt notified.

## 2013-11-25 ENCOUNTER — Telehealth: Payer: Self-pay

## 2013-11-25 ENCOUNTER — Ambulatory Visit (INDEPENDENT_AMBULATORY_CARE_PROVIDER_SITE_OTHER): Payer: Commercial Managed Care - PPO

## 2013-11-25 ENCOUNTER — Ambulatory Visit (INDEPENDENT_AMBULATORY_CARE_PROVIDER_SITE_OTHER): Payer: Commercial Managed Care - PPO | Admitting: Emergency Medicine

## 2013-11-25 VITALS — BP 118/60 | HR 82 | Temp 97.5°F | Resp 16 | Ht 67.25 in | Wt 135.0 lb

## 2013-11-25 DIAGNOSIS — J209 Acute bronchitis, unspecified: Secondary | ICD-10-CM

## 2013-11-25 DIAGNOSIS — R05 Cough: Secondary | ICD-10-CM

## 2013-11-25 DIAGNOSIS — R059 Cough, unspecified: Secondary | ICD-10-CM

## 2013-11-25 MED ORDER — PSEUDOEPHEDRINE-GUAIFENESIN ER 60-600 MG PO TB12: 1.0000 | ORAL_TABLET | Freq: Two times a day (BID) | ORAL | Status: DC

## 2013-11-25 MED ORDER — LEVOFLOXACIN 500 MG PO TABS: 500.0000 mg | ORAL_TABLET | Freq: Every day | ORAL | Status: AC

## 2013-11-25 MED ORDER — HYDROCOD POLST-CHLORPHEN POLST 10-8 MG/5ML PO LQCR: 5.0000 mL | Freq: Two times a day (BID) | ORAL | Status: DC | PRN

## 2013-11-25 NOTE — Patient Instructions (Signed)

## 2013-11-25 NOTE — Telephone Encounter (Signed)
Patient seen recently by Dr. Joseph Art. She has taken all of her steroid medication (Prednisone?) and she is still coughing, has drainage and she has upper back pain. Does she need more medication or a CXR? She is back at work but is not feeling 100%. Please call back at [phone] (work # at [employer]) or cell at [phone]. Will  Be at work until [employer] today.

## 2013-11-25 NOTE — Progress Notes (Signed)
[facility] [address], [place] [address] [phone]  Date:  11/25/2013   Name:  Ashley May   DOB:  31-Jul-1970   MRN:  [id number]  PCP:  Mendel Ryder, MD    Chief Complaint: Follow-up   History of Present Illness:  Ashley May is a 43 y.o. very pleasant female patient who presents with the following:  Ill for several weeks with a cough and post nasal drainage.  Cough is persistent.  No wheezing or shortness of breath Has been treated with zpak and prednisone. No fever or chills. Now has pain in left posterior shoulder. No nausea or vomiting.   No stool change No improvement with over the counter medications or other home remedies. Denies other complaint or health concern today.   Patient Active Problem List   Diagnosis Date Noted  . Chronic cough 11/05/2010  . HYPOKALEMIA 02/21/2010  . Meningioma 02/20/2010  . MULTIPLE SCLEROSIS 02/20/2010  . PALPITATIONS 02/20/2010  . SUPRAVENTRICULAR TACHYCARDIA, HX OF 02/20/2010    Past Medical History  Diagnosis Date  . Chronic headache   . Multiple sclerosis   . Allergic rhinitis     Past Surgical History  Procedure Laterality Date  . Total abdominal hysterectomy  2001  . Breast surgery  1994  . Meningioma/pineal cyst    . Orif proximal tibial plateau fracture    . Knee arthroscopy w/ meniscal repair      bilateral    History  Substance Use Topics  . Smoking status: Never Smoker   . Smokeless tobacco: Not on file  . Alcohol Use: Not on file    Family History  Problem Relation Age of Onset  . Heart disease Paternal Grandfather   . Stomach cancer Paternal Grandmother   . Colon cancer Paternal Grandmother     Allergies  Allergen Reactions  . Morphine     REACTION: itching  . Prochlorperazine Edisylate     Compazine REACTION: anaphylactic shock    Medication list has been reviewed and updated.  Current Outpatient Prescriptions on File Prior to Visit  Medication Sig  Dispense Refill  . HYDROcodone-homatropine (HYCODAN) 5-1.5 MG/5ML syrup Take 5 mLs by mouth every 8 (eight) hours as needed for cough.  120 mL  0  . topiramate (TOPAMAX) 100 MG tablet Take 50 mg by mouth 2 (two) times daily.        Marland Kitchen azithromycin (ZITHROMAX Z-PAK) 250 MG tablet Take as directed on pack  6 tablet  0  . omeprazole (PRILOSEC) 20 MG capsule Take 20 mg by mouth at bedtime.        . predniSONE (DELTASONE) 20 MG tablet Take 2 tablets (40 mg total) by mouth daily.  10 tablet  0   No current facility-administered medications on file prior to visit.    Review of Systems:  As per HPI, otherwise negative.    Physical Examination: Filed Vitals:   11/25/13 1536  BP: 118/60  Pulse: 82  Temp: 97.5 F (36.4 C)  Resp: 16   Filed Vitals:   11/25/13 1536  Height: 5' 7.25" (1.708 m)  Weight: 135 lb (61.236 kg)   Body mass index is 20.99 kg/(m^2). Ideal Body Weight: Weight in (lb) to have BMI = 25: 160.5  GEN: WDWN, NAD, Non-toxic, A & O x 3 HEENT: Atraumatic, Normocephalic. Neck supple. No masses, No LAD. Ears and Nose: No external deformity. CV: RRR, No M/G/R. No JVD. No thrill. No extra heart sounds.  PULM: CTA B, no wheezes, crackles, rhonchi. No retractions. No resp. distress. No accessory muscle use. ABD: S, NT, ND, +BS. No rebound. No HSM. EXTR: No c/c/e NEURO Normal gait.  PSYCH: Normally interactive. Conversant. Not depressed or anxious appearing.  Calm demeanor.    Assessment and Plan: Bronchitis levaquin mucinex d tussionex   Signed,  Ellison Carwin, MD   [facility] reading (PRIMARY) by  Dr. Ouida Sills.  negative.

## 2013-11-25 NOTE — Telephone Encounter (Signed)
Spoke to pt- she is going to [facility].

## 2014-01-11 ENCOUNTER — Ambulatory Visit (INDEPENDENT_AMBULATORY_CARE_PROVIDER_SITE_OTHER): Payer: 59 | Admitting: Internal Medicine

## 2014-01-11 ENCOUNTER — Encounter: Payer: Self-pay | Admitting: Internal Medicine

## 2014-01-11 VITALS — BP 90/50 | HR 73 | Temp 98.1°F | Ht 67.25 in | Wt 138.5 lb

## 2014-01-11 DIAGNOSIS — D329 Benign neoplasm of meninges, unspecified: Secondary | ICD-10-CM

## 2014-01-11 DIAGNOSIS — I493 Ventricular premature depolarization: Secondary | ICD-10-CM

## 2014-01-11 DIAGNOSIS — Z13220 Encounter for screening for lipoid disorders: Secondary | ICD-10-CM

## 2014-01-11 DIAGNOSIS — G35 Multiple sclerosis: Secondary | ICD-10-CM

## 2014-01-11 DIAGNOSIS — R002 Palpitations: Secondary | ICD-10-CM

## 2014-01-11 DIAGNOSIS — R42 Dizziness and giddiness: Secondary | ICD-10-CM

## 2014-01-11 NOTE — Progress Notes (Signed)
Pre visit review using our clinic review tool, if applicable. No additional management support is needed unless otherwise documented below in the visit note. 

## 2014-01-16 ENCOUNTER — Encounter: Payer: Self-pay | Admitting: Internal Medicine

## 2014-01-16 NOTE — Assessment & Plan Note (Signed)
Stable.  Has been seeing Dr Dellis Filbert.  On no medication.  Wants to get established with a neurologist in [place].

## 2014-01-16 NOTE — Progress Notes (Signed)
Subjective:    Patient ID: Ashley May, female    DOB: 01-24-1971, 43 y.o.   MRN: [id number]  HPI 43 year old female with past history of multiple sclerosis, meningioma and PVC's.  She comes in today to follow up on these issues as well as to establish care here at [facility].  Has been seeing Dr Ronita Hipps for gyn care.  Is s/p hysterectomy - adenomyosis.  Her MS is stable.  Has been seeing Dr Dellis Filbert.  On no medications.  No relapse in 13 years.  Has a meningioma (1.2 cm per pt report).  Has been stable and has been followed yearly.  Stays active.  Still has an issue with PVCs.   May occur 3-4x/day.  Occasionally notices some associated light headedness.  No chest pain or tightness.  No sob.  Taking topamax.  Controls migraines.     Past Medical History  Diagnosis Date  . Chronic headache   . Multiple sclerosis   . Allergic rhinitis   . Meningioma   . PVC's (premature ventricular contractions)     Outpatient Encounter Prescriptions as of 01/11/2014  Medication Sig  . topiramate (TOPAMAX) 100 MG tablet Take 50 mg by mouth 2 (two) times daily.    . [DISCONTINUED] azithromycin (ZITHROMAX Z-PAK) 250 MG tablet Take as directed on pack  . [DISCONTINUED] chlorpheniramine-HYDROcodone (TUSSIONEX PENNKINETIC ER) 10-8 MG/5ML LQCR Take 5 mLs by mouth every 12 (twelve) hours as needed.  . [DISCONTINUED] HYDROcodone-homatropine (HYCODAN) 5-1.5 MG/5ML syrup Take 5 mLs by mouth every 8 (eight) hours as needed for cough.  . [DISCONTINUED] omeprazole (PRILOSEC) 20 MG capsule Take 20 mg by mouth at bedtime.    . [DISCONTINUED] predniSONE (DELTASONE) 20 MG tablet Take 2 tablets (40 mg total) by mouth daily.  . [DISCONTINUED] pseudoephedrine-guaifenesin (MUCINEX D) 60-600 MG per tablet Take 1 tablet by mouth every 12 (twelve) hours.    Review of Systems Headaches controlled on topamax.  Light headedness with PVCs   No chest pain, tightness or palpatations.  No increased shortness of breath, cough or  congestion.  No nausea or vomiting.  No abdominal pain or cramping.  No bowel change, such as diarrhea, constipation, BRBPR or melana.  No urine change.        Objective:   Physical Exam Filed Vitals:   01/11/14 1352  BP: 90/50  Pulse: 73  Temp: 98.1 F (36.7 C)   Blood pressure recheck:  15/83  43 year old female in no acute distress.   HEENT:  Nares- clear.  Oropharynx - without lesions. NECK:  Supple.  Nontender.  No audible bruit.  HEART:  Appears to be regular. LUNGS:  No crackles or wheezing audible.  Respirations even and unlabored.  RADIAL PULSE:  Equal bilaterally.  ABDOMEN:  Soft, nontender.  Bowel sounds present and normal.  No audible abdominal bruit. EXTREMITIES:  No increased edema present.  DP pulses palpable and equal bilaterally.          Assessment & Plan:  HEALTH MAINTENANCE.  Schedule her for a physical.  She will schedule her mammogram.  Check routine labs.    Problem List Items Addressed This Visit   Meningioma     States has been stable.  Has been followed yearly by neurology.      MULTIPLE SCLEROSIS     Stable.  Has been seeing Dr Dellis Filbert.  On no medication.  Wants to get established with a neurologist in [place].       PALPITATIONS  Increased palpitations, etc.  Has been told had PVCs previously.  Increased recently with some associated light headedness.  Refer to cardiology for further evaluation and question of need for further w/up - including holter, etc.        Other Visit Diagnoses   PVC's (premature ventricular contractions)    -  Primary    Relevant Orders       EKG 12-Lead (Completed)    Lightheadedness        Relevant Orders       EKG 12-Lead (Completed)

## 2014-01-16 NOTE — Assessment & Plan Note (Signed)
States has been stable.  Has been followed yearly by neurology.

## 2014-01-16 NOTE — Assessment & Plan Note (Signed)
Increased palpitations, etc.  Has been told had PVCs previously.  Increased recently with some associated light headedness.  Refer to cardiology for further evaluation and question of need for further w/up - including holter, etc.

## 2014-01-18 ENCOUNTER — Other Ambulatory Visit (INDEPENDENT_AMBULATORY_CARE_PROVIDER_SITE_OTHER): Payer: 59

## 2014-01-18 DIAGNOSIS — I493 Ventricular premature depolarization: Secondary | ICD-10-CM

## 2014-01-18 DIAGNOSIS — Z13220 Encounter for screening for lipoid disorders: Secondary | ICD-10-CM

## 2014-01-18 LAB — LIPID PANEL
CHOL/HDL RATIO: 3
Cholesterol: 125 mg/dL (ref 0–200)
HDL: 37.3 mg/dL — ABNORMAL LOW (ref >39.00)
LDL Cholesterol: 80 mg/dL (ref 0–99)
NONHDL: 87.7
Triglycerides: 39 mg/dL (ref 0.0–149.0)
VLDL: 7.8 mg/dL (ref 0.0–40.0)

## 2014-01-18 LAB — CBC WITH DIFFERENTIAL/PLATELET
BASOS ABS: 0.1 10*3/uL (ref 0.0–0.1)
Basophils Relative: 0.7 % (ref 0.0–3.0)
EOS PCT: 6.3 % — AB (ref 0.0–5.0)
Eosinophils Absolute: 0.6 10*3/uL (ref 0.0–0.7)
HCT: 40.6 % (ref 36.0–46.0)
HEMOGLOBIN: 13.6 g/dL (ref 12.0–15.0)
LYMPHS PCT: 27.9 % (ref 12.0–46.0)
Lymphs Abs: 2.5 10*3/uL (ref 0.7–4.0)
MCHC: 33.6 g/dL (ref 30.0–36.0)
MCV: 87.1 fl (ref 78.0–100.0)
MONOS PCT: 7.3 % (ref 3.0–12.0)
Monocytes Absolute: 0.6 10*3/uL (ref 0.1–1.0)
NEUTROS ABS: 5.1 10*3/uL (ref 1.4–7.7)
Neutrophils Relative %: 57.8 % (ref 43.0–77.0)
PLATELETS: 330 10*3/uL (ref 150.0–400.0)
RBC: 4.66 Mil/uL (ref 3.87–5.11)
RDW: 13.9 % (ref 11.5–15.5)
WBC: 8.8 10*3/uL (ref 4.0–10.5)

## 2014-01-18 LAB — COMPREHENSIVE METABOLIC PANEL
ALT: 10 U/L (ref 0–35)
AST: 13 U/L (ref 0–37)
Albumin: 3.5 g/dL (ref 3.5–5.2)
Alkaline Phosphatase: 45 U/L (ref 39–117)
BUN: 18 mg/dL (ref 6–23)
CALCIUM: 8.9 mg/dL (ref 8.4–10.5)
CHLORIDE: 111 meq/L (ref 96–112)
CO2: 21 meq/L (ref 19–32)
Creatinine, Ser: 0.8 mg/dL (ref 0.4–1.2)
GFR: 83.19 mL/min (ref >60.00)
Glucose, Bld: 100 mg/dL — ABNORMAL HIGH (ref 70–99)
Potassium: 4.1 mEq/L (ref 3.5–5.1)
SODIUM: 138 meq/L (ref 135–145)
TOTAL PROTEIN: 7.3 g/dL (ref 6.0–8.3)
Total Bilirubin: 0.6 mg/dL (ref 0.2–1.2)

## 2014-01-18 LAB — TSH: TSH: 1.52 u[IU]/mL (ref 0.35–4.50)

## 2014-01-19 ENCOUNTER — Encounter: Payer: Self-pay | Admitting: Internal Medicine

## 2014-01-24 ENCOUNTER — Ambulatory Visit (INDEPENDENT_AMBULATORY_CARE_PROVIDER_SITE_OTHER): Payer: 59 | Admitting: Cardiovascular Disease

## 2014-01-24 ENCOUNTER — Encounter: Payer: Self-pay | Admitting: Cardiovascular Disease

## 2014-01-24 VITALS — BP 106/74 | HR 70 | Ht 68.0 in | Wt 138.0 lb

## 2014-01-24 DIAGNOSIS — I493 Ventricular premature depolarization: Secondary | ICD-10-CM

## 2014-01-24 NOTE — Telephone Encounter (Signed)
Unread mychart message mailed to patient 

## 2014-01-24 NOTE — Progress Notes (Signed)
Primary care physician: Dr. Einar Pheasant  HPI  This is a 43 year old female with past history of multiple sclerosis, meningioma and PVC's. She was referred for evaluation of increased palpitations. Her MS is stable. Has been seeing Dr Dellis Filbert. On no medications. No relapse in 13 years. Has a meningioma (1.2 cm per pt report). Has been stable and has been followed yearly. Stays active and exercises in a regular basis. she denies any chest pain or shortness of breath. She does have overall mild palpitations which have increased recently. She does not consume any caffeinated products and has no thyroid disease. She reports having PVCs for years.    Allergies  Allergen Reactions  . Morphine     REACTION: itching  . Prochlorperazine Edisylate     Compazine REACTION: anaphylactic shock     Current Outpatient Prescriptions on File Prior to Visit  Medication Sig Dispense Refill  . topiramate (TOPAMAX) 100 MG tablet Take 50 mg by mouth 2 (two) times daily.       No current facility-administered medications on file prior to visit.     Past Medical History  Diagnosis Date  . Chronic headache   . Multiple sclerosis   . Allergic rhinitis   . Meningioma   . PVC's (premature ventricular contractions)      Past Surgical History  Procedure Laterality Date  . Total abdominal hysterectomy  2001  . Breast surgery  1994  . Meningioma/pineal cyst    . Orif proximal tibial plateau fracture    . Knee arthroscopy w/ meniscal repair      bilateral     Family History  Problem Relation Age of Onset  . Heart disease Paternal Grandfather   . Stomach cancer Paternal Grandmother   . Colon cancer Paternal Grandmother   . Heart murmur Father   . Heart disease Father   . Heart failure Father      History   Social History  . Marital Status: Married    Spouse Name: N/A    Number of Children: 2  . Years of Education: N/A   Occupational History  . 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 Smoking status: Never Smoker   . Smokeless tobacco: Never Used  . Alcohol Use: No  . Drug Use: No  . Sexual Activity: Not on file   Other Topics Concern  . Not on file   Social History Narrative     ROS A 10 point review of system was performed. It is negative other than that mentioned in the history of present illness.   PHYSICAL EXAM   BP 106/74 mmHg  Pulse 70  Ht 5\' 8"  (1.727 m)  Wt 138 lb (62.596 kg)  BMI 20.99 kg/m2 Constitutional: She is oriented to person, place, and time. She appears well-developed and well-nourished. No distress.  HENT: No nasal discharge.  Head: Normocephalic and atraumatic.  Eyes: Pupils are equal and round. No discharge.  Neck: Normal range of motion. Neck supple. No JVD present. No thyromegaly present.  Cardiovascular: Normal rate, regular rhythm, normal heart sounds. Exam reveals no gallop and no friction rub. No murmur heard.  Pulmonary/Chest: Effort normal and breath sounds normal. No stridor. No respiratory distress. She has no wheezes. She has no rales. She exhibits no tenderness.  Abdominal: Soft. Bowel sounds are normal. She exhibits no distension. There is no tenderness. There is no rebound and no guarding.  Musculoskeletal: Normal range of motion. She  exhibits no edema and no tenderness.  Neurological: She is alert and oriented to person, place, and time. Coordination normal.  Skin: Skin is warm and dry. No rash noted. She is not diaphoretic. No erythema. No pallor.  Psychiatric: She has a normal mood and affect. Her behavior is normal. Judgment and thought content normal.     UVJ:DYNXGZ sinus rhythm with no significant ST or T wave changes.   ASSESSMENT AND PLAN

## 2014-01-24 NOTE — Patient Instructions (Signed)
Your physician has requested that you have an echocardiogram. Echocardiography is a painless test that uses sound waves to create images of your heart. It provides your doctor with information about the size and shape of your heart and how well your heart's chambers and valves are working. This procedure takes approximately one hour. There are no restrictions for this procedure.   Your physician has recommended that you wear a 24 holter monitor. Holter monitors are medical devices that record the heart's electrical activity. Doctors most often use these monitors to diagnose arrhythmias. Arrhythmias are problems with the speed or rhythm of the heartbeat. The monitor is a small, portable device. You can wear one while you do your normal daily activities. This is usually used to diagnose what is causing palpitations/syncope (passing out).  Your physician recommends that you schedule a follow-up appointment in:  As needed   Your next appointment will be scheduled in our new office located at :  [place]  [address], [place]  [place], [place] [address]

## 2014-01-25 ENCOUNTER — Other Ambulatory Visit: Payer: 59

## 2014-01-26 DIAGNOSIS — I493 Ventricular premature depolarization: Secondary | ICD-10-CM | POA: Insufficient documentation

## 2014-01-26 NOTE — Assessment & Plan Note (Signed)
The patient reports chronic history of PVCs with recent worsening of symptoms. She does not consume caffeinated products. I think we need to quantify the PVCs and insure no evidence of cardiomyopathy or structural heart disease. Thus, I requested a Holter monitor and an echocardiogram. If the PVC burden is low with no significant abnormalities on echocardiogram, then no treatment would be indicated given that symptoms are overall mild.

## 2014-02-10 ENCOUNTER — Other Ambulatory Visit (INDEPENDENT_AMBULATORY_CARE_PROVIDER_SITE_OTHER): Payer: 59

## 2014-02-10 ENCOUNTER — Other Ambulatory Visit: Payer: Self-pay

## 2014-02-10 DIAGNOSIS — I493 Ventricular premature depolarization: Secondary | ICD-10-CM

## 2014-02-10 DIAGNOSIS — R002 Palpitations: Secondary | ICD-10-CM

## 2014-02-10 DIAGNOSIS — R42 Dizziness and giddiness: Secondary | ICD-10-CM

## 2014-04-18 ENCOUNTER — Encounter: Payer: 59 | Admitting: Internal Medicine

## 2014-04-27 ENCOUNTER — Encounter: Payer: Self-pay | Admitting: *Deleted

## 2014-05-02 ENCOUNTER — Telehealth: Payer: Self-pay | Admitting: Internal Medicine

## 2014-05-02 NOTE — Telephone Encounter (Signed)
Left message for pt to call the office to reschedule missed physical appt/msn

## 2014-05-11 ENCOUNTER — Telehealth: Payer: Self-pay | Admitting: Internal Medicine

## 2014-05-11 DIAGNOSIS — G35 Multiple sclerosis: Secondary | ICD-10-CM

## 2014-05-11 NOTE — Telephone Encounter (Signed)
I had seen pt previously and we discussed her history of multiple sclerosis.  I informed her that I would like to get her set up with a neurologist to follow her MS.  She works in [employer].  I thought she was going to call me back and let me know if there was a specific neurologist she preferred to see.  I have not heard back.  I can refer her to Dr Jannifer Franklin with [facility] if agreeable.  Just let me know.   Thanks.

## 2014-05-13 NOTE — Telephone Encounter (Signed)
Left message for pt to return my call.

## 2014-05-17 ENCOUNTER — Encounter: Payer: Self-pay | Admitting: *Deleted

## 2014-05-17 NOTE — Telephone Encounter (Signed)
Letter mailed

## 2014-07-26 NOTE — Telephone Encounter (Signed)
Order placed for referral to neurology - Dr Felecia Shelling

## 2014-07-26 NOTE — Telephone Encounter (Signed)
Pt called today.  She is wanting to see Dr Felecia Shelling at [phone] phone.  Please advise

## 2014-07-26 NOTE — Telephone Encounter (Signed)
I have placed the order for the referral.  Someone should be contacting her with an appt date and time.  Thanks.

## 2014-07-27 NOTE — Telephone Encounter (Signed)
Pt aware.

## 2014-08-04 ENCOUNTER — Ambulatory Visit (INDEPENDENT_AMBULATORY_CARE_PROVIDER_SITE_OTHER): Payer: 59 | Admitting: Neurology

## 2014-08-04 ENCOUNTER — Encounter: Payer: Self-pay | Admitting: Neurology

## 2014-08-04 VITALS — BP 104/58 | HR 74 | Resp 14 | Ht 68.0 in | Wt 139.6 lb

## 2014-08-04 DIAGNOSIS — G43009 Migraine without aura, not intractable, without status migrainosus: Secondary | ICD-10-CM | POA: Insufficient documentation

## 2014-08-04 DIAGNOSIS — R202 Paresthesia of skin: Secondary | ICD-10-CM | POA: Insufficient documentation

## 2014-08-04 DIAGNOSIS — G35 Multiple sclerosis: Secondary | ICD-10-CM | POA: Diagnosis not present

## 2014-08-04 DIAGNOSIS — D329 Benign neoplasm of meninges, unspecified: Secondary | ICD-10-CM | POA: Diagnosis not present

## 2014-08-04 DIAGNOSIS — J329 Chronic sinusitis, unspecified: Secondary | ICD-10-CM | POA: Insufficient documentation

## 2014-08-04 MED ORDER — TOPIRAMATE 25 MG PO TABS: ORAL_TABLET | ORAL | Status: DC

## 2014-08-04 MED ORDER — LORAZEPAM 1 MG PO TABS: ORAL_TABLET | ORAL | Status: DC

## 2014-08-04 NOTE — Progress Notes (Signed)
[facility]  PATIENT: Ashley May DOB: 1971-03-03  REFERRING DOCTOR OR PCP:  Einar Pheasant SOURCE: patient, records in EMR and MRI images on PACS/CD  _________________________________   HISTORICAL  CHIEF COMPLAINT:  Chief Complaint  Patient presents with  . Multiple Sclerosis    Sts. first dx. in 2004.  Presenting sx. was numbness in hands/fingers.  Dx. confirmed with mri and lp.  She iniitially saw Dr. Erling Cruz and Dr. Ellene Route, and most recently saw Dr. Dellis Filbert.  She  first tried Betaseron, but stopped this in 2000 due to flu-like sx.  She has not been on any MS med since then and denies progression of sx.  Last mri brain/c-spine was in 2007 and she has that cd with her.  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 Numbness    was never read by anyone./fim  . Migraines    She has been on Topamax $RemoveBe'150mg'ldsEfyuUn$  bid for h/a's--has been out of med for several days and would like refill.  Sts. on the Topamax she had 1-2 h/a's weekly/fim    HISTORY OF PRESENT ILLNESS:  Ashley May is a 44 year old woman with multiple sclerosis who was diagnosed in 1994 after presenting with a letter might sign. She also had tingling in her hands. An MRI of the cervical spine revealed a cervical plaque. She also had an MRI of the brain and a lumbar puncture at that time. The studies were consistent with multiple sclerosis.   She was placed on Betaseron but had a lot of difficulty tolerating the medication. Specifically she would get severe myalgias and fatigue. 4, she used the medication intermittently over the next 7 years or so. In 2000, she delivered a healthy daughter. 44 A few months later, she became pregnant again but this pregnancy was complicated by adenomyosis and she required a hysterectomy. Afterwards, she had an exacerbation. Her symptoms at that time included a poor gait with a Romberg sign. She was placed on several  days of IV steroids and improved. Around 2001, she stopped Betaseron and has not been on any therapy since. Most of this time, she was seeing Dr. Jacqulynn Cadet. She last saw him about 2 or 3 years ago.  I personally reviewed the MRI of the brain dated 02/13/2010. It shows white matter lesions in a pattern and configuration consistent with multiple sclerosis. They are all located in the hemispheres and there are no brainstem or cerebellar lesions. There are no acute findings. Additionally, there is a 1.2 cm right frontal meningioma.   MRI of the cervical spine12/31/2007 shows midline posterior plaque at C2 and left posterior plaque at C3.    She had another MRI of the brain 06/08/2012 that I personally reviewed and compared to the previous MRI. It shows a similar pattern and configuration of white matter foci. In the interval, there has only been the development of only 1 new small plaque in the right cerebral hemisphere. As before, there were no posterior fossa lesions. That study was done without contrast.  The meningioma looks about the same size. Chronic left maxillary and ethmoid sinusitis is noted.  Gait/strength/sensation: She feels that her gait is normal. She is able to walk long distances without difficulty. She can take steps with her eyes closed and not fall. The significant weakness in her arms or legs. She has no fixed numbness but will get some tingling in the  right arm and the left face when she gets hot.  Bladder: She denies any significant problems with her bladder. She has no urgency, frequency or hesitancy. She has no nocturia.  Vision:   She denies any MS related vision problem.  Fatigue/sleep: She does not note too much difficulty with fatigue. However, she can tell when the temperature is harder and she will try to stay call. She falls asleep and stays asleep well most nights. She'll have nocturia once some nights.  Mood/cognition: She denies any depression or anxiety. She denies any  difficulty with cognitive function.  Headache: She gets headaches that are located around the nose and eye region bilaterally. These usually occur while she is at work and are much more rare at her house. On rare occasion she will wake up with a headache but usually they occur during the day. The quality of the pain is pressure-like. Moving her head will make her dizzy. This will happen whether she has a headache or not. She denies nausea or vomiting with the headache.  Her brother has primary progressive MS.  REVIEW OF SYSTEMS: Constitutional: No fevers, chills, sweats, or change in appetite Eyes: No visual changes, double vision, eye pain Ear, nose and throat: No hearing loss, ear pain, nasal congestion, sore throat Cardiovascular: No chest pain, palpitations Respiratory: No shortness of breath at rest or with exertion.   No wheezes GastrointestinaI: No nausea, vomiting, diarrhea, abdominal pain, fecal incontinence Genitourinary: No dysuria, urinary retention or frequency.  No nocturia. Musculoskeletal: No neck pain, back pain Integumentary: No rash, pruritus, skin lesions Neurological: as above Psychiatric: No depression at this time.  No anxiety Endocrine: No palpitations, diaphoresis, change in appetite, change in weigh or increased thirst Hematologic/Lymphatic: No anemia, purpura, petechiae. Allergic/Immunologic: No itchy/runny eyes, nasal congestion, recent allergic reactions, rashes  ALLERGIES: Allergies  Allergen Reactions  . Morphine     REACTION: itching  . Prochlorperazine Edisylate     Compazine REACTION: anaphylactic shock    HOME MEDICATIONS:  Current outpatient prescriptions:  Marland Kitchen  Multiple Vitamin (MULTIVITAMIN) tablet, Take 1 tablet by mouth daily., Disp: , Rfl:  .  topiramate (TOPAMAX) 100 MG tablet, Take 50 mg by mouth 2 (two) times daily.  , Disp: , Rfl:   PAST MEDICAL HISTORY: Past Medical History  Diagnosis Date  . Chronic headache   . Multiple  sclerosis   . Allergic rhinitis   . Meningioma   . PVC's (premature ventricular contractions)     PAST SURGICAL HISTORY: Past Surgical History  Procedure Laterality Date  . Total abdominal hysterectomy  2001  . Breast surgery  1994  . Meningioma/pineal cyst    . Orif proximal tibial plateau fracture    . Knee arthroscopy w/ meniscal repair      bilateral    FAMILY HISTORY: Family History  Problem Relation Age of Onset  . Heart disease Paternal Grandfather   . Stomach cancer Paternal Grandmother   . Colon cancer Paternal Grandmother   . Heart murmur Father   . Heart disease Father   . Heart failure Father     SOCIAL HISTORY:  History   Social History  . Marital Status: Married    Spouse Name: N/A  . Number of Children: 2  . Years of Education: N/A   Occupational History  . 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 Smoking status: Never Smoker   . Smokeless tobacco: Never Used  . Alcohol Use: No  .  Drug Use: No  . Sexual Activity: Not on file   Other Topics Concern  . Not on file   Social History Narrative     PHYSICAL EXAM  Filed Vitals:   08/04/14 0838  BP: 104/58  Pulse: 74  Resp: 14  Height: $Remove'5\' 8"'XgrgwSp$  (1.727 m)  Weight: 139 lb 9.6 oz (63.322 kg)    Body mass index is 21.23 kg/(m^2).   General: The patient is well-developed and well-nourished and in no acute distress  Eyes:  Funduscopic exam shows normal optic discs and retinal vessels.  Neck: The neck is supple, no carotid bruits are noted.  The neck is nontender.  Cardiovascular: The heart has a regular rate and rhythm with a normal S1 and S2. There were no murmurs, gallops or rubs. Lungs are clear to auscultation.  Skin: Extremities are without significant edema.  Musculoskeletal:  Back is nontender  Neurologic Exam  Mental status: The patient is alert and oriented x 3 at the time of the examination. The patient has apparent normal recent and remote memory, with  an apparently normal attention span and concentration ability.   Speech is normal.  Cranial nerves: Extraocular movements are full. Pupils are equal, round, and reactive to light and accomodation.  Color vision is symmetric.  Visual fields are full.  Facial symmetry is present. There is good facial sensation to soft touch bilaterally.Facial strength is normal.  Trapezius and sternocleidomastoid strength is normal. No dysarthria is noted.  The tongue is midline, and the patient has symmetric elevation of the soft palate. No obvious hearing deficits are noted.  Motor:  Muscle bulk is normal.   Tone is normal. Strength is  5 / 5 in all 4 extremities.   Sensory: Sensory testing is intact to pinprick, soft touch and vibration sensation in all 4 extremities.  Coordination: Cerebellar testing reveals good finger-nose-finger and heel-to-shin bilaterally.  Gait and station: Station is normal.   Gait is normal. Tandem gait is very minimally wide. Romberg is negative.   Reflexes: Deep tendon reflexes are symmetric and normal bilaterally.       DIAGNOSTIC DATA (LABS, IMAGING, TESTING) - I reviewed patient records, labs, notes, testing and imaging myself where available.  Lab Results  Component Value Date   WBC 8.8 01/18/2014   HGB 13.6 01/18/2014   HCT 40.6 01/18/2014   MCV 87.1 01/18/2014   PLT 330.0 01/18/2014      Component Value Date/Time   NA 138 01/18/2014 0802   K 4.1 01/18/2014 0802   CL 111 01/18/2014 0802   CO2 21 01/18/2014 0802   GLUCOSE 100* 01/18/2014 0802   BUN 18 01/18/2014 0802   CREATININE 0.8 01/18/2014 0802   CALCIUM 8.9 01/18/2014 0802   PROT 7.3 01/18/2014 0802   ALBUMIN 3.5 01/18/2014 0802   AST 13 01/18/2014 0802   ALT 10 01/18/2014 0802   ALKPHOS 45 01/18/2014 0802   BILITOT 0.6 01/18/2014 0802   GFRNONAA 109.73 02/21/2010 1302   GFRAA  05/31/2009 1000    >60        The eGFR has been calculated using the MDRD equation. This calculation has not  been validated in all clinical situations. eGFR's persistently <60 mL/min signify possible Chronic Kidney Disease.   Lab Results  Component Value Date   CHOL 125 01/18/2014   HDL 37.30* 01/18/2014   LDLCALC 80 01/18/2014   TRIG 39.0 01/18/2014   CHOLHDL 3 01/18/2014   No results found for: HGBA1C No results found for: [id number]  Lab Results  Component Value Date   TSH 1.52 01/18/2014       ASSESSMENT AND PLAN  Multiple sclerosis - Plan: MR Brain W Wo Contrast  Meningioma - Plan: MR Brain W Wo Contrast  Paresthesia - Plan: MR Brain W Wo Contrast  Common migraine without intractability   In summary, Jhoana Upham is a 44 year old woman with relapsing remitting multiple sclerosis who has done very well off of any medication for the past 5 years. Her MRI in 2014 showed no significant progression with at most one new lesion compared to the previous MRI. She also has a small meningioma that appears chronic and some chronic sinusitis. She just gets some paresthesias now and then and no medical treatment for these are needed. She does have headaches that are somewhat controlled by Topamax. I started her to take over-the-counter NSAIDs when one occurs to try to knock him out sooner. We will set up an MRI of the brain with and without contrast to make sure that she is not having subclinical progression of the MS. If this recurs, I will strongly recommend that she reconsider going back onto a disease modifying medication for the MS. Her meningioma appears to be stable and has been so for many years. This will be followed as she will be requiring MRIs to rule out breakthrough MS disease every year or 2.  She will return to see me in one year or call sooner if he has new or worsening neurologic symptoms.  Dezirae Service A. Felecia Shelling, MD, PhD 9/54/2481, 4:43 AM Certified in Neurology, Clinical Neurophysiology, Sleep Medicine, Pain Medicine and Neuroimaging  [facility] [address], [place], [place] [address] [phone]

## 2014-08-18 ENCOUNTER — Telehealth: Payer: Self-pay

## 2014-08-18 ENCOUNTER — Ambulatory Visit (HOSPITAL_COMMUNITY)
Admission: RE | Admit: 2014-08-18 | Discharge: 2014-08-18 | Disposition: A | Discharge status: Home / Self Care | Payer: 59 | Source: Ambulatory Visit | Attending: Neurology | Admitting: Neurology

## 2014-08-18 DIAGNOSIS — G35 Multiple sclerosis: Secondary | ICD-10-CM | POA: Diagnosis not present

## 2014-08-18 DIAGNOSIS — R51 Headache: Secondary | ICD-10-CM | POA: Insufficient documentation

## 2014-08-18 DIAGNOSIS — D329 Benign neoplasm of meninges, unspecified: Secondary | ICD-10-CM | POA: Diagnosis not present

## 2014-08-18 DIAGNOSIS — R202 Paresthesia of skin: Secondary | ICD-10-CM

## 2014-08-18 MED ORDER — GADOBENATE DIMEGLUMINE 529 MG/ML IV SOLN
15.0000 mL | Freq: Once | INTRAVENOUS | Status: AC | PRN
  Administered 2014-08-18: 12 mL via INTRAVENOUS

## 2014-08-18 NOTE — Telephone Encounter (Signed)
Patient was informed that  the MRI of the brain looks good. Compared to 2011, there is only one spot and it is small. Only one new plaque in 5 years is excellent and we can continue therapy per physician

## 2014-10-12 ENCOUNTER — Telehealth: Payer: Self-pay | Admitting: Internal Medicine

## 2014-12-21 ENCOUNTER — Encounter: Payer: Self-pay | Admitting: *Deleted

## 2014-12-21 ENCOUNTER — Telehealth: Payer: Self-pay | Admitting: Neurology

## 2014-12-21 NOTE — Telephone Encounter (Signed)
Per RAS, ok for DTAP vaccine.  I have printed letters stating ok for vaccine, and letter stating exercise is med. nec. for maintenance and improvement of function due to MS. and will fax them once RAS has signed them.  I have spoken with Morayo and advised letters will be faxed, and ok to get vaccine.  She verbalized understanding of same/fim

## 2014-12-21 NOTE — Telephone Encounter (Signed)
Pt called stating she works at [employer] in [employer] and should get a DTAP shot. Employee health is apprehensive about her getting it as she has MS. She needs a letter stating she can or can not have the shot. Please fax to pt at [phone]. Also insurance will pay for her to be on exercise program because she has MS. She needs RX for this. Please mail this RX. If you have any questions pt can be reached at [phone].

## 2014-12-22 NOTE — Telephone Encounter (Signed)
Both letters faxed to [facility] at fax # [phone], with fax confirmation received/fim

## 2015-01-10 ENCOUNTER — Other Ambulatory Visit: Payer: Self-pay

## 2015-01-10 DIAGNOSIS — Z1231 Encounter for screening mammogram for malignant neoplasm of breast: Secondary | ICD-10-CM

## 2015-02-02 ENCOUNTER — Ambulatory Visit
Admission: RE | Admit: 2015-02-02 | Discharge: 2015-02-02 | Disposition: A | Discharge status: Home / Self Care | Payer: 59 | Source: Ambulatory Visit

## 2015-02-02 DIAGNOSIS — Z1231 Encounter for screening mammogram for malignant neoplasm of breast: Secondary | ICD-10-CM

## 2015-02-03 ENCOUNTER — Encounter: Payer: Self-pay | Admitting: Internal Medicine

## 2015-02-03 DIAGNOSIS — Z0000 Encounter for general adult medical examination without abnormal findings: Secondary | ICD-10-CM | POA: Insufficient documentation

## 2015-03-28 ENCOUNTER — Encounter: Payer: Self-pay | Admitting: Internal Medicine

## 2015-03-28 ENCOUNTER — Ambulatory Visit (INDEPENDENT_AMBULATORY_CARE_PROVIDER_SITE_OTHER): Payer: 59 | Admitting: Internal Medicine

## 2015-03-28 ENCOUNTER — Other Ambulatory Visit (HOSPITAL_COMMUNITY)
Admission: RE | Admit: 2015-03-28 | Discharge: 2015-03-28 | Disposition: A | Discharge status: Home / Self Care | Payer: 59 | Source: Ambulatory Visit | Attending: Internal Medicine | Admitting: Internal Medicine

## 2015-03-28 VITALS — BP 100/60 | HR 65 | Temp 98.0°F | Resp 18 | Ht 67.0 in | Wt 138.5 lb

## 2015-03-28 DIAGNOSIS — G43009 Migraine without aura, not intractable, without status migrainosus: Secondary | ICD-10-CM

## 2015-03-28 DIAGNOSIS — Z124 Encounter for screening for malignant neoplasm of cervix: Secondary | ICD-10-CM

## 2015-03-28 DIAGNOSIS — G35 Multiple sclerosis: Secondary | ICD-10-CM

## 2015-03-28 DIAGNOSIS — Z13220 Encounter for screening for lipoid disorders: Secondary | ICD-10-CM

## 2015-03-28 DIAGNOSIS — Z0000 Encounter for general adult medical examination without abnormal findings: Secondary | ICD-10-CM

## 2015-03-28 DIAGNOSIS — I493 Ventricular premature depolarization: Secondary | ICD-10-CM | POA: Diagnosis not present

## 2015-03-28 DIAGNOSIS — D329 Benign neoplasm of meninges, unspecified: Secondary | ICD-10-CM

## 2015-03-28 DIAGNOSIS — Z01419 Encounter for gynecological examination (general) (routine) without abnormal findings: Secondary | ICD-10-CM | POA: Diagnosis not present

## 2015-03-28 DIAGNOSIS — Z1151 Encounter for screening for human papillomavirus (HPV): Secondary | ICD-10-CM | POA: Insufficient documentation

## 2015-03-28 LAB — CBC WITH DIFFERENTIAL/PLATELET
Basophils Absolute: 0.1 10*3/uL (ref 0.0–0.1)
Basophils Relative: 1.3 % (ref 0.0–3.0)
EOS PCT: 2.8 % (ref 0.0–5.0)
Eosinophils Absolute: 0.2 10*3/uL (ref 0.0–0.7)
HEMATOCRIT: 41.5 % (ref 36.0–46.0)
HEMOGLOBIN: 13.7 g/dL (ref 12.0–15.0)
Lymphocytes Relative: 26.1 % (ref 12.0–46.0)
Lymphs Abs: 2.3 10*3/uL (ref 0.7–4.0)
MCHC: 32.9 g/dL (ref 30.0–36.0)
MCV: 87.7 fl (ref 78.0–100.0)
MONOS PCT: 8.2 % (ref 3.0–12.0)
Monocytes Absolute: 0.7 10*3/uL (ref 0.1–1.0)
Neutro Abs: 5.3 10*3/uL (ref 1.4–7.7)
Neutrophils Relative %: 61.6 % (ref 43.0–77.0)
Platelets: 389 10*3/uL (ref 150.0–400.0)
RBC: 4.74 Mil/uL (ref 3.87–5.11)
RDW: 13.1 % (ref 11.5–15.5)
WBC: 8.7 10*3/uL (ref 4.0–10.5)

## 2015-03-28 LAB — COMPREHENSIVE METABOLIC PANEL
ALBUMIN: 4 g/dL (ref 3.5–5.2)
ALT: 9 U/L (ref 0–35)
AST: 13 U/L (ref 0–37)
Alkaline Phosphatase: 65 U/L (ref 39–117)
BUN: 21 mg/dL (ref 6–23)
CO2: 27 mEq/L (ref 19–32)
Calcium: 9.3 mg/dL (ref 8.4–10.5)
Chloride: 107 mEq/L (ref 96–112)
Creatinine, Ser: 0.7 mg/dL (ref 0.40–1.20)
GFR: 96.52 mL/min (ref >60.00)
Glucose, Bld: 90 mg/dL (ref 70–99)
POTASSIUM: 4 meq/L (ref 3.5–5.1)
Sodium: 140 mEq/L (ref 135–145)
TOTAL PROTEIN: 7.3 g/dL (ref 6.0–8.3)
Total Bilirubin: 0.4 mg/dL (ref 0.2–1.2)

## 2015-03-28 LAB — LIPID PANEL
CHOLESTEROL: 126 mg/dL (ref 0–200)
HDL: 34.2 mg/dL — AB (ref >39.00)
LDL Cholesterol: 82 mg/dL (ref 0–99)
NONHDL: 91.67
Total CHOL/HDL Ratio: 4
Triglycerides: 48 mg/dL (ref 0.0–149.0)
VLDL: 9.6 mg/dL (ref 0.0–40.0)

## 2015-03-28 LAB — TSH: TSH: 1.2 u[IU]/mL (ref 0.35–4.50)

## 2015-03-28 NOTE — Progress Notes (Signed)
Pre-visit discussion using our clinic review tool. No additional management support is needed unless otherwise documented below in the visit note.  

## 2015-03-28 NOTE — Assessment & Plan Note (Signed)
Physical today 03/28/15.  Mammogram 02/03/15 - birads I.  Given family history and bowel issues, refer to GI for question of need for colonoscopy.

## 2015-03-28 NOTE — Assessment & Plan Note (Signed)
Has been followed by neurology.  Seeing Dr Felecia Shelling now.

## 2015-03-28 NOTE — Assessment & Plan Note (Signed)
On no medication.  Saw Dr Felecia Shelling.  MRI stable.  No recent flares.  Follow.

## 2015-03-28 NOTE — Progress Notes (Signed)
Patient ID: Ashley May, female   DOB: [birth year], 45 y.o.   MRN: [id number]   Subjective:    Patient ID: Ashley May, female    DOB: 27-Dec-1970, 45 y.o.   MRN: [id number]  HPI  Patient with past history of MS and chronic headaches.  She comes in today to follow up on these issues as well as for a complete physical exam.  She saw dr Felecia Shelling for evaluation of her MS.  Had MRI brain.  Stable.  See his note for details.  No recent flares.  She does report having headaches.  Has a history of headaches.  Is more intense and more frequent now.  She is not taking her topamax bid.  No other triggers.  We discussed taking the topamax regularly.  She is eating and drinking well.  No abdominal pain or cramping.  She reports that at times she feels she has to strain to have a bowel movement.  Reports feels like something (mass) "pushes out when strains".  No bleeding.  Father has a history of UC.  Grandmother - colon cancer.     Past Medical History  Diagnosis Date  . Chronic headache   . Multiple sclerosis ([place])   . Allergic rhinitis   . Meningioma ([place])   . PVC's (premature ventricular contractions)    Past Surgical History  Procedure Laterality Date  . Total abdominal hysterectomy  2001  . Breast surgery  1994  . Meningioma/pineal cyst    . Orif proximal tibial plateau fracture    . Knee arthroscopy w/ meniscal repair      bilateral   Family History  Problem Relation Age of Onset  . Heart disease Paternal Grandfather   . Stomach cancer Paternal Grandmother   . Colon cancer Paternal Grandmother   . Heart murmur Father   . Heart disease Father   . Heart failure Father    Social History   Social History  . Marital Status: Married    Spouse Name: N/A  . Number of Children: 2  . Years of Education: N/A   Occupational History  . 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 Smoking status: Never Smoker   . Smokeless tobacco: Never Used  . Alcohol Use: No    . Drug Use: No  . Sexual Activity: Not Asked   Other Topics Concern  . None   Social History Narrative    Outpatient Encounter Prescriptions as of 03/28/2015  Medication Sig  . Multiple Vitamin (MULTIVITAMIN) tablet Take 1 tablet by mouth daily.  Marland Kitchen topiramate (TOPAMAX) 25 MG tablet Take 3 po bid  . [DISCONTINUED] LORazepam (ATIVAN) 1 MG tablet Take as needed before MRI (Patient not taking: Reported on 03/28/2015)   No facility-administered encounter medications on file as of 03/28/2015.    Review of Systems  Constitutional: Negative for appetite change and unexpected weight change.  HENT: Negative for congestion and sinus pressure.   Eyes: Negative for discharge and redness.  Respiratory: Negative for cough, chest tightness and shortness of breath.   Cardiovascular: Negative for chest pain, palpitations and leg swelling.  Gastrointestinal: Negative for nausea, vomiting, abdominal pain and diarrhea.  Genitourinary: Negative for dysuria and difficulty urinating.  Musculoskeletal: Negative for back pain and joint swelling.  Skin: Negative for color change and rash.  Neurological: Positive for headaches. Negative for dizziness and light-headedness.  Hematological: Negative for adenopathy. Does not bruise/bleed easily.  Psychiatric/Behavioral: Negative for dysphoric mood and  agitation.       Objective:    Physical Exam  Constitutional: She is oriented to person, place, and time. She appears well-developed and well-nourished. No distress.  HENT:  Nose: Nose normal.  Mouth/Throat: Oropharynx is clear and moist.  Eyes: Right eye exhibits no discharge. Left eye exhibits no discharge. No scleral icterus.  Neck: Neck supple. No thyromegaly present.  Cardiovascular: Normal rate and regular rhythm.   Pulmonary/Chest: Breath sounds normal. No accessory muscle usage. No tachypnea. No respiratory distress. She has no decreased breath sounds. She has no wheezes. She has no rhonchi. Right  breast exhibits no inverted nipple, no mass, no nipple discharge and no tenderness (no axillary adenopathy). Left breast exhibits no inverted nipple, no mass, no nipple discharge and no tenderness (no axilarry adenopathy).  Abdominal: Soft. Bowel sounds are normal. There is no tenderness.  Genitourinary:  Normal external genitalia.  Vaginal vault without lesions.  Cervix identified.  Pap smear performed.  Could not appreciate any adnexal masses or tenderness.   Rectal exam - no masses palpated.  Heme negative.    Musculoskeletal: She exhibits no edema or tenderness.  Lymphadenopathy:    She has no cervical adenopathy.  Neurological: She is alert and oriented to person, place, and time.  Skin: Skin is warm. No rash noted. No erythema.  Psychiatric: She has a normal mood and affect. Her behavior is normal.    BP 100/60 mmHg  Pulse 65  Temp(Src) 98 F (36.7 C) (Oral)  Resp 18  Ht 5\' 7"  (1.702 m)  Wt 138 lb 8 oz (62.823 kg)  BMI 21.69 kg/m2  SpO2 99% Wt Readings from Last 3 Encounters:  03/28/15 138 lb 8 oz (62.823 kg)  08/04/14 139 lb 9.6 oz (63.322 kg)  01/24/14 138 lb (62.596 kg)     Lab Results  Component Value Date   WBC 8.7 03/28/2015   HGB 13.7 03/28/2015   HCT 41.5 03/28/2015   PLT 389.0 03/28/2015   GLUCOSE 90 03/28/2015   CHOL 126 03/28/2015   TRIG 48.0 03/28/2015   HDL 34.20* 03/28/2015   LDLCALC 82 03/28/2015   ALT 9 03/28/2015   AST 13 03/28/2015   NA 140 03/28/2015   K 4.0 03/28/2015   CL 107 03/28/2015   CREATININE 0.70 03/28/2015   BUN 21 03/28/2015   CO2 27 03/28/2015   TSH 1.20 03/28/2015   INR 1.03 05/31/2009    Mm Screening Breast W/implant Tomo Bilateral  02/03/2015  CLINICAL DATA:  Screening. EXAM: DIGITAL SCREENING BILATERAL MAMMOGRAM WITH IMPLANTS, 3D TOMO WITH CAD The patient has retropectoral implants. Standard and implant displaced views were performed. COMPARISON:  Previous exam(s). ACR Breast Density Category c: The breast tissue is  heterogeneously dense, which may obscure small masses. FINDINGS: There are no findings suspicious for malignancy. Images were processed with CAD. IMPRESSION: No mammographic evidence of malignancy. A result letter of this screening mammogram will be mailed directly to the patient. RECOMMENDATION: Screening mammogram in one year. (Code:SM-B-01Y) BI-RADS CATEGORY  1:  Negative. Electronically Signed   By: Curlene Dolphin M.D.   On: 02/03/2015 11:47       Assessment & Plan:   Problem List Items Addressed This Visit    Common migraine without intractability    Headaches have increased some in intensity and frequency.  Discussed at length with her today.  She saw neurology.  Had MRI. Stable.  Not taking topamax bid as prescribed.  Discussed taking on a regular basis.  Call with  update.  If persistent symptoms, will get neurology's input.        Health care maintenance    Physical today 03/28/15.  Mammogram 02/03/15 - birads I.  Given family history and bowel issues, refer to GI for question of need for colonoscopy.       Meningioma [facility])    Has been followed by neurology.  Seeing Dr Felecia Shelling now.        MULTIPLE SCLEROSIS    On no medication.  Saw Dr Felecia Shelling.  MRI stable.  No recent flares.  Follow.       Relevant Orders   CBC with Differential/Platelet (Completed)   Comprehensive metabolic panel (Completed)   PVCs (premature ventricular contractions)    Saw Dr Fletcher Anon.  Refer to his note for details.  Avoids decongestants and stimulants.  Stable.  Follow.        Relevant Orders   TSH (Completed)    Other Visit Diagnoses    Pap smear for cervical cancer screening    -  Primary    Relevant Orders    Cytology - PAP    Screening cholesterol level        Relevant Orders    Lipid panel (Completed)        Einar Pheasant, MD

## 2015-03-28 NOTE — Assessment & Plan Note (Signed)
Saw Dr Fletcher Anon.  Refer to his note for details.  Avoids decongestants and stimulants.  Stable.  Follow.

## 2015-03-28 NOTE — Assessment & Plan Note (Signed)
Headaches have increased some in intensity and frequency.  Discussed at length with her today.  She saw neurology.  Had MRI. Stable.  Not taking topamax bid as prescribed.  Discussed taking on a regular basis.  Call with update.  If persistent symptoms, will get neurology's input.

## 2015-03-30 ENCOUNTER — Other Ambulatory Visit: Payer: Self-pay | Admitting: Internal Medicine

## 2015-03-30 DIAGNOSIS — R198 Other specified symptoms and signs involving the digestive system and abdomen: Secondary | ICD-10-CM

## 2015-03-30 NOTE — Progress Notes (Signed)
Order placed for GI referral to Dr Arta Silence.

## 2015-03-31 LAB — CYTOLOGY - PAP

## 2015-04-03 ENCOUNTER — Encounter: Payer: Self-pay | Admitting: *Deleted

## 2015-04-05 MED FILL — TOPIRAMATE 25 MG TABLET: 25 | 30 days supply | Qty: 180 | Fill #5

## 2015-04-06 DIAGNOSIS — Z8371 Family history of colonic polyps: Secondary | ICD-10-CM | POA: Diagnosis not present

## 2015-04-06 DIAGNOSIS — K5900 Constipation, unspecified: Secondary | ICD-10-CM | POA: Diagnosis not present

## 2015-04-06 DIAGNOSIS — R110 Nausea: Secondary | ICD-10-CM | POA: Diagnosis not present

## 2015-05-19 MED FILL — TOPIRAMATE 25 MG TABLET: 25 | 30 days supply | Qty: 180 | Fill #6

## 2015-06-19 MED FILL — TOPIRAMATE 25 MG TABLET: 25 | 30 days supply | Qty: 180 | Fill #7

## 2015-06-29 ENCOUNTER — Other Ambulatory Visit: Payer: Self-pay | Admitting: Gastroenterology

## 2015-06-29 DIAGNOSIS — Z8371 Family history of colonic polyps: Secondary | ICD-10-CM | POA: Diagnosis not present

## 2015-06-29 DIAGNOSIS — R110 Nausea: Secondary | ICD-10-CM | POA: Diagnosis not present

## 2015-06-29 DIAGNOSIS — R1013 Epigastric pain: Secondary | ICD-10-CM | POA: Diagnosis not present

## 2015-06-30 ENCOUNTER — Encounter (HOSPITAL_COMMUNITY): Payer: Self-pay | Admitting: *Deleted

## 2015-07-04 ENCOUNTER — Other Ambulatory Visit: Payer: Self-pay | Admitting: Gastroenterology

## 2015-07-05 ENCOUNTER — Ambulatory Visit (HOSPITAL_COMMUNITY): Payer: 59 | Admitting: Anesthesiology

## 2015-07-05 ENCOUNTER — Ambulatory Visit (HOSPITAL_COMMUNITY)
Admission: RE | Admit: 2015-07-05 | Discharge: 2015-07-05 | Disposition: A | Discharge status: Home / Self Care | Payer: 59 | Source: Ambulatory Visit | Attending: Gastroenterology | Admitting: Gastroenterology

## 2015-07-05 ENCOUNTER — Encounter (HOSPITAL_COMMUNITY): Payer: Self-pay

## 2015-07-05 ENCOUNTER — Encounter (HOSPITAL_COMMUNITY)
Admission: RE | Disposition: A | Discharge status: Home / Self Care | Payer: Self-pay | Source: Ambulatory Visit | Attending: Gastroenterology

## 2015-07-05 DIAGNOSIS — R110 Nausea: Secondary | ICD-10-CM | POA: Diagnosis not present

## 2015-07-05 DIAGNOSIS — K449 Diaphragmatic hernia without obstruction or gangrene: Secondary | ICD-10-CM | POA: Insufficient documentation

## 2015-07-05 DIAGNOSIS — R1013 Epigastric pain: Secondary | ICD-10-CM | POA: Insufficient documentation

## 2015-07-05 DIAGNOSIS — G35 Multiple sclerosis: Secondary | ICD-10-CM | POA: Insufficient documentation

## 2015-07-05 HISTORY — DX: Other complications of anesthesia, initial encounter: T88.59XA

## 2015-07-05 HISTORY — DX: Adverse effect of unspecified anesthetic, initial encounter: T41.45XA

## 2015-07-05 HISTORY — PX: ESOPHAGOGASTRODUODENOSCOPY (EGD) WITH PROPOFOL: SHX5813

## 2015-07-05 SURGERY — ESOPHAGOGASTRODUODENOSCOPY (EGD) WITH PROPOFOL
Anesthesia: Monitor Anesthesia Care

## 2015-07-05 MED ORDER — OMEPRAZOLE-SODIUM BICARBONATE 40-1100 MG PO CAPS: 1.0000 | ORAL_CAPSULE | Freq: Two times a day (BID) | ORAL | Status: DC

## 2015-07-05 MED ORDER — PROPOFOL 500 MG/50ML IV EMUL
INTRAVENOUS | Status: DC | PRN
  Administered 2015-07-05: 120 ug/kg/min via INTRAVENOUS

## 2015-07-05 MED ORDER — PROPOFOL 10 MG/ML IV BOLUS
INTRAVENOUS | Status: DC | PRN
  Administered 2015-07-05: 20 mg via INTRAVENOUS
  Administered 2015-07-05: 30 mg via INTRAVENOUS

## 2015-07-05 MED ORDER — SODIUM CHLORIDE 0.9 % IV SOLN
INTRAVENOUS | Status: DC
Start: 2015-07-05 — End: 2015-07-05

## 2015-07-05 MED ORDER — LIDOCAINE HCL (CARDIAC) 20 MG/ML IV SOLN
INTRAVENOUS | Status: DC | PRN
  Administered 2015-07-05: 20 mg via INTRATRACHEAL
  Administered 2015-07-05: 30 mg via INTRATRACHEAL

## 2015-07-05 MED ORDER — LACTATED RINGERS IV SOLN
INTRAVENOUS | Status: DC
  Administered 2015-07-05: 10:00:00 via INTRAVENOUS

## 2015-07-05 MED ORDER — PROPOFOL 10 MG/ML IV BOLUS
INTRAVENOUS | Status: AC
  Filled 2015-07-05: qty 60

## 2015-07-05 MED ORDER — ONDANSETRON HCL 4 MG/2ML IJ SOLN
INTRAMUSCULAR | Status: DC | PRN
  Administered 2015-07-05: 4 mg via INTRAVENOUS

## 2015-07-05 MED ORDER — ONDANSETRON HCL 4 MG/2ML IJ SOLN
INTRAMUSCULAR | Status: AC
  Filled 2015-07-05: qty 2

## 2015-07-05 SURGICAL SUPPLY — 15 items

## 2015-07-05 NOTE — Discharge Instructions (Signed)
Endoscopy °Care After °Please read the instructions outlined below and refer to this sheet in the next few weeks. These discharge instructions provide you with general information on caring for yourself after you leave the hospital. Your doctor may also give you specific instructions. While your treatment has been planned according to the most current medical practices available, unavoidable complications occasionally occur. If you have any problems or questions after discharge, please call Dr. Devanta Daniel ([facility]) at [phone]. ° °HOME CARE INSTRUCTIONS °Activity °· You may resume your regular activity but move at a slower pace for the next 24 hours.  °· Take frequent rest periods for the next 24 hours.  °· Walking will help expel (get rid of) the air and reduce the bloated feeling in your abdomen.  °· No driving for 24 hours (because of the anesthesia (medicine) used during the test).  °· You may shower.  °· Do not sign any important legal documents or operate any machinery for 24 hours (because of the anesthesia used during the test).  °Nutrition °· Drink plenty of fluids.  °· You may resume your normal diet.  °· Begin with a light meal and progress to your normal diet.  °· Avoid alcoholic beverages for 24 hours or as instructed by your caregiver.  °Medications °You may resume your normal medications unless your caregiver tells you otherwise. °What you can expect today °· You may experience abdominal discomfort such as a feeling of fullness or "gas" pains.  °· You may experience a sore throat for 2 to 3 days. This is normal. Gargling with salt water may help this.  °·  °SEEK IMMEDIATE MEDICAL CARE IF: °· You have excessive nausea (feeling sick to your stomach) and/or vomiting.  °· You have severe abdominal pain and distention (swelling).  °· You have trouble swallowing.  °· You have a temperature over 100° F (37.8° C).  °· You have rectal bleeding or vomiting of blood.  °Document Released:  10/24/2003 Document Revised: 11/21/2010 Document Reviewed: 05/06/2007 °Exit[employer]® Patient Information ©2012 [employer]. °

## 2015-07-05 NOTE — H&P (Signed)
Patient interval history reviewed.  Patient examined again.  There has been no change from documented H/P dated 06/29/15 (scanned into chart from our office) except as documented above.  Assessment:  1.  Nausea. 2.  Epigastric discomfort.  Plan:  1.  Endoscopy. 2.  Risks (bleeding, infection, bowel perforation that could require surgery, sedation-related changes in cardiopulmonary systems), benefits (identification and possible treatment of source of symptoms, exclusion of certain causes of symptoms), and alternatives (watchful waiting, radiographic imaging studies, empiric medical treatment) of upper endoscopy (EGD) were explained to patient/family in detail and patient wishes to proceed.

## 2015-07-05 NOTE — Anesthesia Preprocedure Evaluation (Addendum)
Anesthesia Evaluation  Patient identified by MRN, date of birth, ID band Patient awake    Reviewed: Allergy & Precautions, NPO status , Patient's Chart, lab work & pertinent test results  History of Anesthesia Complications (+) PROLONGED EMERGENCE and history of anesthetic complications (prolonged emergence with wisdom teeth extraction)  Airway Mallampati: I  TM Distance: >3 FB Neck ROM: Full    Dental  (+) Teeth Intact, Dental Advisory Given   Pulmonary neg pulmonary ROS,    Pulmonary exam normal breath sounds clear to auscultation       Cardiovascular Exercise Tolerance: Good Normal cardiovascular exam+ dysrhythmias (PVCs)  Rhythm:Regular Rate:Normal     Neuro/Psych  Headaches, Multiple sclerosis--last flare 16 yrs ago; denies any weakness currently  Right frontal meningioma negative psych ROS   GI/Hepatic Neg liver ROS, Nausea; denies vomiting   Endo/Other  negative endocrine ROS  Renal/GU negative Renal ROS     Musculoskeletal negative musculoskeletal ROS (+)   Abdominal   Peds  Hematology negative hematology ROS (+)   Anesthesia Other Findings Day of surgery medications reviewed with the patient.  Reproductive/Obstetrics negative OB ROS                            Anesthesia Physical Anesthesia Plan  ASA: III  Anesthesia Plan: MAC   Post-op Pain Management:    Induction: Intravenous  Airway Management Planned: Nasal Cannula  Additional Equipment:   Intra-op Plan:   Post-operative Plan:   Informed Consent: I have reviewed the patients History and Physical, chart, labs and discussed the procedure including the risks, benefits and alternatives for the proposed anesthesia with the patient or authorized representative who has indicated his/her understanding and acceptance.   Dental advisory given  Plan Discussed with: CRNA and Anesthesiologist  Anesthesia Plan Comments:  (Discussed risks/benefits/alternatives to MAC sedation including need for ventilatory support, hypotension, need for conversion to general anesthesia.  All patient questions answered.  Patient/guardian wishes to proceed.)        Anesthesia Quick Evaluation

## 2015-07-05 NOTE — Transfer of Care (Signed)
Immediate Anesthesia Transfer of Care Note  Patient: Ashley May  Procedure(s) Performed: Procedure(s): ESOPHAGOGASTRODUODENOSCOPY (EGD) WITH PROPOFOL (N/A)  Patient Location: PACU and Endoscopy Unit  Anesthesia Type:MAC  Level of Consciousness: awake, alert , oriented and patient cooperative  Airway & Oxygen Therapy: Patient Spontanous Breathing and Patient connected to nasal cannula oxygen  Post-op Assessment: Report given to RN and Post -op Vital signs reviewed and stable  Post vital signs: Reviewed and stable  Last Vitals:  Filed Vitals:   07/05/15 1019 07/05/15 1157  BP: 119/78 119/92  Pulse: 67 78  Temp: 36.4 C   Resp: 10 15    Complications: No apparent anesthesia complications

## 2015-07-05 NOTE — Anesthesia Postprocedure Evaluation (Signed)
Anesthesia Post Note  Patient: Ashley May  Procedure(s) Performed: Procedure(s) (LRB): ESOPHAGOGASTRODUODENOSCOPY (EGD) WITH PROPOFOL (N/A)  Patient location during evaluation: Endoscopy Anesthesia Type: MAC Level of consciousness: awake and alert Pain management: pain level controlled Vital Signs Assessment: post-procedure vital signs reviewed and stable Respiratory status: spontaneous breathing, nonlabored ventilation, respiratory function stable and patient connected to nasal cannula oxygen Cardiovascular status: stable and blood pressure returned to baseline Anesthetic complications: no    Last Vitals:  Filed Vitals:   07/05/15 1220 07/05/15 1225  BP: 108/66   Pulse: 66 62  Temp:    Resp: 15 12    Last Pain: There were no vitals filed for this visit.               Catalina Gravel

## 2015-07-05 NOTE — Op Note (Signed)
[facility] Patient Name: Ashley May Procedure Date: 07/05/2015 MRN: [id number] Attending MD: Arta Silence , MD Date of Birth: Nov 22, 1970 CSN:  Age: 45 Admit Type: Outpatient Procedure:                Upper GI endoscopy Indications:              Epigastric abdominal pain, Nausea Providers:                Arta Silence, MD, Cleda Daub, RN, Alfonso Patten,                            Technician, [facility], CRNA Referring MD:              Medicines:                Propofol per Anesthesia, Ondansetron 4 mg IV Complications:            No immediate complications. Estimated Blood Loss:     Estimated blood loss: none. Procedure:                Pre-Anesthesia Assessment:                           - Prior to the procedure, a History and Physical                            was performed, and patient medications and                            allergies were reviewed. The patient's tolerance of                            previous anesthesia was also reviewed. The risks                            and benefits of the procedure and the sedation                            options and risks were discussed with the patient.                            All questions were answered, and informed consent                            was obtained. Prior Anticoagulants: The patient has                            taken no previous anticoagulant or antiplatelet                            agents. ASA Grade Assessment: III - A patient with                            severe systemic disease. After reviewing the risks  and benefits, the patient was deemed in                            satisfactory condition to undergo the procedure.                           After obtaining informed consent, the endoscope was                            passed under direct vision. Throughout the                            procedure, the patient's blood pressure, pulse, and                           oxygen saturations were monitored continuously. The                            [id number]) scope was introduced through the                            mouth, and advanced to the second part of duodenum.                            The upper GI endoscopy was accomplished without                            difficulty. The patient tolerated the procedure                            well. Scope In: Scope Out: Findings:      A small hiatal hernia was present.      The exam of the esophagus was otherwise normal.      The entire examined stomach was normal.      The duodenal bulb, first portion of the duodenum and second portion of       the duodenum were normal. Impression:               - Small hiatal hernia.                           - Normal stomach.                           - Normal duodenal bulb, first portion of the                            duodenum and second portion of the duodenum.                           - The examination was otherwise normal. Moderate Sedation:      None Recommendation:           - Patient has a contact number available for  emergencies. The signs and symptoms of potential                            delayed complications were discussed with the                            patient. Return to normal activities tomorrow.                            Written discharge instructions were provided to the                            patient.                           - Discharge patient to home (ambulatory).                           - Resume previous diet today.                           - Follow an antireflux regimen until further notice.                           - Stop Ranitidine.                           - Use Zegerid (omeprazole+bicarb) 20 mg tab by                            mouth BID until further notice.                           - If symptoms persist after a 7-day trial of                             Zegerid, would obtain CT scan abdomen/pelvis with                            contrast as next step in management.                           - Return to GI clinic at appointment to be                            scheduled.                           - Return to referring physician as previously                            scheduled.                           - Continue present medications. Procedure Code(s):        --- Professional ---  [id number], Esophagogastroduodenoscopy, flexible,                            transoral; diagnostic, including collection of                            specimen(s) by brushing or washing, when performed                            (separate procedure) Diagnosis Code(s):        --- Professional ---                           K44.9, Diaphragmatic hernia without obstruction or                            gangrene                           R10.13, Epigastric pain                           R11.0, Nausea CPT copyright 2016 American Medical Association. All rights reserved. The codes documented in this report are preliminary and upon coder review may  be revised to meet current compliance requirements. Arta Silence, MD Arta Silence, MD 07/05/2015 12:02:12 PM This report has been signed electronically. Number of Addenda: 0

## 2015-07-06 ENCOUNTER — Encounter (HOSPITAL_COMMUNITY): Payer: Self-pay | Admitting: Gastroenterology

## 2015-07-26 DIAGNOSIS — M25562 Pain in left knee: Secondary | ICD-10-CM | POA: Diagnosis not present

## 2015-08-04 ENCOUNTER — Ambulatory Visit: Payer: 59 | Admitting: Neurology

## 2015-08-09 ENCOUNTER — Other Ambulatory Visit: Payer: Self-pay | Admitting: Neurology

## 2015-08-09 MED FILL — TOPIRAMATE 25 MG TABLET: 25 | 30 days supply | Qty: 180 | Fill #0

## 2015-08-14 MED FILL — ONDANSETRON ODT 8 MG TABLET: 8 | 15 days supply | Qty: 45 | Fill #0

## 2015-08-16 ENCOUNTER — Other Ambulatory Visit (HOSPITAL_COMMUNITY): Payer: Self-pay | Admitting: Gastroenterology

## 2015-08-16 DIAGNOSIS — R1013 Epigastric pain: Secondary | ICD-10-CM

## 2015-08-16 DIAGNOSIS — R110 Nausea: Secondary | ICD-10-CM

## 2015-10-02 ENCOUNTER — Ambulatory Visit: Payer: 59 | Admitting: Internal Medicine

## 2015-10-02 ENCOUNTER — Encounter: Payer: Self-pay | Admitting: *Deleted

## 2015-10-02 DIAGNOSIS — Z0289 Encounter for other administrative examinations: Secondary | ICD-10-CM

## 2015-10-05 ENCOUNTER — Encounter: Payer: Self-pay | Admitting: Neurology

## 2015-10-05 ENCOUNTER — Ambulatory Visit (INDEPENDENT_AMBULATORY_CARE_PROVIDER_SITE_OTHER): Payer: 59 | Admitting: Neurology

## 2015-10-05 VITALS — BP 114/66 | HR 68 | Resp 14 | Ht 67.0 in | Wt 140.0 lb

## 2015-10-05 DIAGNOSIS — G35 Multiple sclerosis: Secondary | ICD-10-CM

## 2015-10-05 DIAGNOSIS — G43009 Migraine without aura, not intractable, without status migrainosus: Secondary | ICD-10-CM | POA: Diagnosis not present

## 2015-10-05 DIAGNOSIS — R202 Paresthesia of skin: Secondary | ICD-10-CM | POA: Diagnosis not present

## 2015-10-05 MED ORDER — TOPIRAMATE 50 MG PO TABS: ORAL_TABLET | ORAL | Status: DC

## 2015-10-05 MED FILL — TOPIRAMATE 50 MG TABLET: 50 | 30 days supply | Qty: 60 | Fill #0

## 2015-10-05 NOTE — Progress Notes (Signed)
[facility]  PATIENT: Ashley May DOB: [birth year]  REFERRING DOCTOR OR PCP:  Einar Pheasant SOURCE: patient, records in EMR and MRI images on PACS/CD  _________________________________   HISTORICAL  CHIEF COMPLAINT:  Chief Complaint  Patient presents with  . Multiple Sclerosis    She continues off of any MS med.  Last MRI brain showed only one small new spot.  Sts. h/a's are about once a week.  She is under more stress; caring for her father who is sick.  Sts. she is still doing well on Topamax/fim    HISTORY OF PRESENT ILLNESS:  Ashley May is a 45 year old woman with multiple sclerosis who was diagnosed in 1994 after presenting with a Lhermite sign.    I personally reviewed the MRI of the brain dated 08/18/2015 and compared it to the MRI dated 02/13/2010. There are white matter lesions in a pattern and configuration consistent with multiple sclerosis. They are all located in the hemispheres and there are no brainstem or cerebellar lesions. Only one small new hemispheric focus in the interim,    Additionally, there is a 1.4 cm right frontal meningioma, slightly increased in size from 1.2 cm.  By her choice, she continues on any disease modifying therapy for her multiple sclerosis.  Gait/strength/sensation: She feels that her gait is normal. She is able to walk long distances without difficulty. She can take steps with her eyes closed and not fall. The significant weakness in her arms or legs. She will rarely get the Lhermite sign and/or tingling in the chest.  Bladder: She denies any significant problems with her bladder. She has no urgency, frequency or hesitancy. She has no nocturia.  Vision:   She denies any MS related vision problem.  Fatigue/sleep: She has only mild fatigue. If she gets too hot, her fingers will get numb. She has had more insomnia with her Father's health issues bothering hr.   Also some sleep maintenance issues.     She has not tried  any sleep aids.  She'll have nocturia once some nights.  Mood/cognition: She denies any depression or anxiety. She denies any difficulty with cognitive function.  Headache: She has a HA 16-20/30 days for more than 4 hours each day.    HA is located around the nose and eye region bilaterally. These usually occur while she is at work and are much more rare at her house. On rare occasion she will wake up with a headache but usually they occur during the day. The quality of the pain is pressure-like. Moving her head will make her dizzy and increase the pain.   She gets nausea but not vomiting.   She does not note much photo- or phonophobia. Excedrin Migraine will help to not a headache out but it sually will come back.   Her brother has primary progressive MS.  MS History:   She presented in 1994 with a Lhermite sign and tingling in her hands. An MRI of the cervical spine revealed a cervical plaque. She also had an MRI of the brain and a lumbar puncture at that time. The studies were consistent with multiple sclerosis.   She was placed on Betaseron but had a lot of difficulty tolerating the medication. Specifically she would get severe myalgias and fatigue. Therefore, she used the medication intermittently over the next 7 years or so. In 2000, she delivered a healthy daughter. A few months later, she became pregnant again but this pregnancy was complicated by adenomyosis and  she required a hysterectomy. Afterwards, she had an exacerbation. Her symptoms at that time included a poor gait with a Romberg sign. She was placed on several days of IV steroids and improved. Around 2001, she stopped Betaseron and has not been on any therapy since. Most of this time, she was seeing Dr. Jacqulynn Cadet. She last saw him in 2014.   I first saw her May 2016.      Review of studies:   MRI of the brain dated 08/18/2015 and compared it to the prior MRI. There are white matter lesions in a pattern and configuration consistent with  multiple sclerosis. They are all located in the hemispheres and there are no brainstem or cerebellar lesions.   Additionally, there is a 1.2 cm right frontal meningioma that was unchanged.   MRI of the cervical spine12/31/2007 shows midline posterior plaque at C2 and left posterior plaque at C3.    She had another MRI of the brain 06/08/2012 compared to 2011 MRI shows a similar pattern and configuration of white matter foci. In the interval, there has only been the development of only 1 new small plaque in the right cerebral hemisphere. As before, there were no posterior fossa lesions. That study was done without contrast.  The meningioma looks about the same size. Chronic left maxillary and ethmoid sinusitis is noted.    REVIEW OF SYSTEMS: Constitutional: No fevers, chills, sweats, or change in appetite Eyes: No visual changes, double vision, eye pain Ear, nose and throat: No hearing loss, ear pain, nasal congestion, sore throat Cardiovascular: No chest pain, palpitations Respiratory: No shortness of breath at rest or with exertion.   No wheezes GastrointestinaI: No nausea, vomiting, diarrhea, abdominal pain, fecal incontinence Genitourinary: No dysuria, urinary retention or frequency.  No nocturia. Musculoskeletal: No neck pain, back pain Integumentary: No rash, pruritus, skin lesions Neurological: as above Psychiatric: No depression at this time.  No anxiety Endocrine: No palpitations, diaphoresis, change in appetite, change in weigh or increased thirst Hematologic/Lymphatic: No anemia, purpura, petechiae. Allergic/Immunologic: No itchy/runny eyes, nasal congestion, recent allergic reactions, rashes  ALLERGIES: Allergies  Allergen Reactions  . Gadolinium Derivatives Nausea And Vomiting    Pt began vomiting immed post gad inj of Multihance. 08/18/2014.  cap  . Eggs Or Egg-Derived Products Nausea And Vomiting  . Morphine Itching  . Prochlorperazine Edisylate Other (See Comments)     Compazine REACTION: anaphylactic shock    HOME MEDICATIONS:  Current outpatient prescriptions:  .  aspirin-acetaminophen-caffeine (EXCEDRIN MIGRAINE) 250-250-65 MG tablet, Take by mouth every 6 (six) hours as needed for headache., Disp: , Rfl:  .  topiramate (TOPAMAX) 50 MG tablet, Take 1 or 2 tablets at bedtime., Disp: 60 tablet, Rfl: 11  PAST MEDICAL HISTORY: Past Medical History  Diagnosis Date  . Multiple sclerosis ([place])   . Allergic rhinitis     not a problem at this time.  . Meningioma ([place])     "frontal"  . PVC's (premature ventricular contractions)   . Complication of anesthesia     slow to awaken after anesthesia with wisdom teeth  . Nausea     persistent nausea x 2 weeks  . Chronic headache     migraines    PAST SURGICAL HISTORY: Past Surgical History  Procedure Laterality Date  . Total abdominal hysterectomy  2001  . Breast surgery  1994  . Meningioma/pineal cyst    . Orif proximal tibial plateau fracture Right   . Knee arthroscopy w/ meniscal repair  bilateral  . Wisdom tooth extraction    . Esophagogastroduodenoscopy (egd) with propofol N/A 07/05/2015    Procedure: ESOPHAGOGASTRODUODENOSCOPY (EGD) WITH PROPOFOL;  Surgeon: Arta Silence, MD;  Location: [facility];  Service: Endoscopy;  Laterality: N/A;    FAMILY HISTORY: Family History  Problem Relation Age of Onset  . Heart disease Paternal Grandfather   . Stomach cancer Paternal Grandmother   . Colon cancer Paternal Grandmother   . Heart murmur Father   . Heart disease Father   . Heart failure Father     SOCIAL HISTORY:  Social History   Social History  . Marital Status: Married    Spouse Name: N/A  . Number of Children: 2  . Years of Education: N/A   Occupational History  . MRI Supervisor at [employer] Topics  . Smoking status: Never Smoker   . Smokeless tobacco: Never Used  . Alcohol Use: No  . Drug Use: No  . Sexual Activity: Not on file   Other  Topics Concern  . Not on file   Social History Narrative     PHYSICAL EXAM  Filed Vitals:   10/05/15 0958  BP: 114/66  Pulse: 68  Resp: 14  Height: '5\' 7"'$  (1.702 m)  Weight: 140 lb (63.504 kg)    Body mass index is 21.92 kg/(m^2).   General: The patient is well-developed and well-nourished and in no acute distress  Eyes:  Funduscopic exam shows normal optic discs and retinal vessels.  Neurologic Exam  Mental status: The patient is alert and oriented x 3 at the time of the examination. The patient has apparent normal recent and remote memory, with an apparently normal attention span and concentration ability.   Speech is normal.  Cranial nerves: Extraocular movements are full.   Facial symmetry is present. There is good facial sensation to soft touch bilaterally.Facial strength is normal.  Trapezius and sternocleidomastoid strength is normal. No dysarthria is noted.  The tongue is midline, and the patient has symmetric elevation of the soft palate. No obvious hearing deficits are noted.  Motor:  Muscle bulk is normal.   Tone is normal. Strength is  5 / 5 in all 4 extremities.   Sensory: Sensory testing is intact to pinprick, soft touch and vibration sensation in all 4 extremities.  Coordination: Cerebellar testing reveals good finger-nose-finger and heel-to-shin bilaterally.  Gait and station: Station is normal.   Gait is normal. Tandem gait is very minimally wide. Romberg is negative.   Reflexes: Deep tendon reflexes are brisk with spread at the knees but no ankle clonus.          DIAGNOSTIC DATA (LABS, IMAGING, TESTING) - I reviewed patient records, labs, notes, testing and imaging myself where available.  Lab Results  Component Value Date   WBC 8.7 03/28/2015   HGB 13.7 03/28/2015   HCT 41.5 03/28/2015   MCV 87.7 03/28/2015   PLT 389.0 03/28/2015      Component Value Date/Time   NA 140 03/28/2015 0950   K 4.0 03/28/2015 0950   CL 107 03/28/2015 0950   CO2  27 03/28/2015 0950   GLUCOSE 90 03/28/2015 0950   BUN 21 03/28/2015 0950   CREATININE 0.70 03/28/2015 0950   CALCIUM 9.3 03/28/2015 0950   PROT 7.3 03/28/2015 0950   ALBUMIN 4.0 03/28/2015 0950   AST 13 03/28/2015 0950   ALT 9 03/28/2015 0950   ALKPHOS 65 03/28/2015 0950   BILITOT 0.4 03/28/2015 0950  GFRNONAA 109.73 02/21/2010 1302   GFRAA  05/31/2009 1000    >60        The eGFR has been calculated using the MDRD equation. This calculation has not been validated in all clinical situations. eGFR's persistently <60 mL/min signify possible Chronic Kidney Disease.   Lab Results  Component Value Date   CHOL 126 03/28/2015   HDL 34.20* 03/28/2015   LDLCALC 82 03/28/2015   TRIG 48.0 03/28/2015   CHOLHDL 4 03/28/2015   No results found for: HGBA1C No results found for: VITAMINB12 Lab Results  Component Value Date   TSH 1.20 03/28/2015       ASSESSMENT AND PLAN  MULTIPLE SCLEROSIS  Paresthesia  Common migraine without intractability   1.She wishes to continue off a disease modifying therapy. As she appears to have a relatively mild MS, we can continue to follow an MRI every 2 years or so. This will also allow [place] to keep an eye on the meningioma (very slight enlargment on last MRI). If she has a couple new lesions on MRI, we would reconsider a disease modifying therapy. Additionally, if she has a significant exacerbation she would reconsider. 2.  We will increase the topiramate from 25 mg first to 50 mg and then, if well tolerated we would consider a further increase to 100 mg.  She will return to see me in one year or call sooner if he has new or worsening neurologic symptoms.  Cruz Bong A. Felecia Shelling, MD, PhD 4/94/4967, 59:16 AM Certified in Neurology, Clinical Neurophysiology, Sleep Medicine, Pain Medicine and Neuroimaging  [facility] [address], [place], [place] [address] [phone]

## 2015-10-31 ENCOUNTER — Other Ambulatory Visit (HOSPITAL_COMMUNITY): Payer: 59

## 2015-10-31 ENCOUNTER — Other Ambulatory Visit (HOSPITAL_COMMUNITY): Payer: Self-pay | Admitting: Gastroenterology

## 2015-10-31 DIAGNOSIS — R1013 Epigastric pain: Secondary | ICD-10-CM

## 2015-10-31 DIAGNOSIS — R110 Nausea: Secondary | ICD-10-CM

## 2015-11-22 MED FILL — TOPIRAMATE 50 MG TABLET: 50 | 30 days supply | Qty: 60 | Fill #1

## 2015-12-04 DIAGNOSIS — M79672 Pain in left foot: Secondary | ICD-10-CM | POA: Diagnosis not present

## 2015-12-04 DIAGNOSIS — G5762 Lesion of plantar nerve, left lower limb: Secondary | ICD-10-CM | POA: Diagnosis not present

## 2015-12-31 MED FILL — TOPIRAMATE 50 MG TABLET: 50 | 30 days supply | Qty: 60 | Fill #2

## 2016-01-31 DIAGNOSIS — M9420 Chondromalacia, unspecified site: Secondary | ICD-10-CM | POA: Diagnosis not present

## 2016-01-31 DIAGNOSIS — M25562 Pain in left knee: Secondary | ICD-10-CM | POA: Diagnosis not present

## 2016-02-21 DIAGNOSIS — M94262 Chondromalacia, left knee: Secondary | ICD-10-CM | POA: Diagnosis not present

## 2016-02-21 DIAGNOSIS — M25562 Pain in left knee: Secondary | ICD-10-CM | POA: Diagnosis not present

## 2016-02-21 MED FILL — TOPIRAMATE 50 MG TABLET: 50 | 30 days supply | Qty: 60 | Fill #3

## 2016-02-28 ENCOUNTER — Encounter: Payer: Self-pay | Admitting: Physician Assistant

## 2016-02-28 ENCOUNTER — Ambulatory Visit: Payer: Self-pay | Admitting: Physician Assistant

## 2016-02-28 VITALS — BP 110/78 | HR 60

## 2016-02-28 DIAGNOSIS — L259 Unspecified contact dermatitis, unspecified cause: Secondary | ICD-10-CM

## 2016-02-28 MED ORDER — METHYLPREDNISOLONE 4 MG PO TBPK: ORAL_TABLET | ORAL | 0 refills | Status: DC

## 2016-02-28 MED ORDER — DEXAMETHASONE SODIUM PHOSPHATE 10 MG/ML IJ SOLN
10.0000 mg | Freq: Once | INTRAMUSCULAR | Status: AC
  Administered 2016-02-28: 10 mg via INTRAMUSCULAR

## 2016-02-28 MED FILL — METHYLPREDNISOLONE 4 MG TAB: 4 | 6 days supply | Qty: 21 | Fill #0

## 2016-02-28 NOTE — Progress Notes (Signed)
S: c/o itchy rash on hands, arms, chest and back for 2 weeks,  was outside in woods and then broke out, tried multiple otc meds without relief, denies fever/chills, has bruising from scratching, denies new exposures or recent travel  O: vitals wnl, nad, lungs c t a, cv rrr, skin with diffuse small raised red areas on arms, trunk, none on face, legs, no drainage, n/v intact, some old bruising on upper arm and forearm, ?if from deep scratching  A: acute contact dermatitis  P: decardon 10mg , medrol dose pack, if bruising worsens will need to order labs

## 2016-03-05 ENCOUNTER — Other Ambulatory Visit: Payer: Self-pay | Admitting: Internal Medicine

## 2016-03-05 DIAGNOSIS — R21 Rash and other nonspecific skin eruption: Secondary | ICD-10-CM

## 2016-03-05 NOTE — Progress Notes (Signed)
Pt called in with persistent rash and itching.  Has been going on for a few weeks.  Has received steroids, etc.  No relief.  Order placed for dermatology referral.

## 2016-03-08 DIAGNOSIS — L298 Other pruritus: Secondary | ICD-10-CM | POA: Diagnosis not present

## 2016-03-08 DIAGNOSIS — R21 Rash and other nonspecific skin eruption: Secondary | ICD-10-CM | POA: Diagnosis not present

## 2016-03-11 DIAGNOSIS — L299 Pruritus, unspecified: Secondary | ICD-10-CM | POA: Diagnosis not present

## 2016-05-13 MED FILL — TOPIRAMATE 50 MG TABLET: 50 | 30 days supply | Qty: 60 | Fill #4

## 2016-07-01 MED FILL — TOPIRAMATE 50 MG TABLET: 50 | 30 days supply | Qty: 60 | Fill #5

## 2016-08-02 ENCOUNTER — Telehealth: Payer: Self-pay | Admitting: Internal Medicine

## 2016-08-02 ENCOUNTER — Other Ambulatory Visit: Payer: Self-pay | Admitting: Internal Medicine

## 2016-08-02 DIAGNOSIS — N644 Mastodynia: Secondary | ICD-10-CM

## 2016-08-02 DIAGNOSIS — Z1231 Encounter for screening mammogram for malignant neoplasm of breast: Secondary | ICD-10-CM

## 2016-08-02 NOTE — Telephone Encounter (Signed)
[facility] stating that [facility] told her the order needs to say Diagnostic mammo with [place]. Thank you!

## 2016-08-05 ENCOUNTER — Other Ambulatory Visit: Payer: Self-pay | Admitting: Internal Medicine

## 2016-08-05 DIAGNOSIS — Z1239 Encounter for other screening for malignant neoplasm of breast: Secondary | ICD-10-CM

## 2016-08-05 DIAGNOSIS — N644 Mastodynia: Secondary | ICD-10-CM

## 2016-08-05 NOTE — Progress Notes (Signed)
Order placed for mammogram and ultrasound as requested.

## 2016-08-05 NOTE — Telephone Encounter (Signed)
Order has been changed

## 2016-08-07 ENCOUNTER — Other Ambulatory Visit: Payer: Self-pay | Admitting: Internal Medicine

## 2016-08-07 DIAGNOSIS — N644 Mastodynia: Secondary | ICD-10-CM

## 2016-08-12 ENCOUNTER — Ambulatory Visit
Admission: RE | Admit: 2016-08-12 | Discharge: 2016-08-12 | Disposition: A | Discharge status: Home / Self Care | Payer: 59 | Source: Ambulatory Visit | Attending: Internal Medicine | Admitting: Internal Medicine

## 2016-08-12 ENCOUNTER — Other Ambulatory Visit: Payer: Self-pay | Admitting: Internal Medicine

## 2016-08-12 DIAGNOSIS — N644 Mastodynia: Secondary | ICD-10-CM

## 2016-08-12 DIAGNOSIS — R928 Other abnormal and inconclusive findings on diagnostic imaging of breast: Secondary | ICD-10-CM | POA: Diagnosis not present

## 2016-08-12 DIAGNOSIS — N6489 Other specified disorders of breast: Secondary | ICD-10-CM | POA: Diagnosis not present

## 2016-08-12 DIAGNOSIS — N6310 Unspecified lump in the right breast, unspecified quadrant: Secondary | ICD-10-CM

## 2016-08-12 DIAGNOSIS — Z1239 Encounter for other screening for malignant neoplasm of breast: Secondary | ICD-10-CM

## 2016-08-14 ENCOUNTER — Other Ambulatory Visit: Payer: Self-pay | Admitting: Internal Medicine

## 2016-08-14 ENCOUNTER — Ambulatory Visit
Admission: RE | Admit: 2016-08-14 | Discharge: 2016-08-14 | Disposition: A | Discharge status: Home / Self Care | Payer: 59 | Source: Ambulatory Visit | Attending: Internal Medicine | Admitting: Internal Medicine

## 2016-08-14 DIAGNOSIS — N6310 Unspecified lump in the right breast, unspecified quadrant: Secondary | ICD-10-CM

## 2016-08-14 DIAGNOSIS — N6311 Unspecified lump in the right breast, upper outer quadrant: Secondary | ICD-10-CM | POA: Diagnosis not present

## 2016-08-14 HISTORY — PX: BREAST BIOPSY: SHX20

## 2016-08-16 ENCOUNTER — Other Ambulatory Visit: Payer: Self-pay | Admitting: Internal Medicine

## 2016-08-16 DIAGNOSIS — R928 Other abnormal and inconclusive findings on diagnostic imaging of breast: Secondary | ICD-10-CM

## 2016-08-16 NOTE — Progress Notes (Signed)
Orders placed for f/u right breast diagnostic mammogram and ultrasound.

## 2016-09-16 ENCOUNTER — Other Ambulatory Visit: Payer: Self-pay | Admitting: Internal Medicine

## 2016-09-16 NOTE — Progress Notes (Signed)
Opened in error

## 2016-09-17 ENCOUNTER — Other Ambulatory Visit: Payer: Self-pay | Admitting: Internal Medicine

## 2016-09-17 DIAGNOSIS — N63 Unspecified lump in unspecified breast: Secondary | ICD-10-CM

## 2016-10-07 ENCOUNTER — Telehealth: Payer: Self-pay | Admitting: Neurology

## 2016-10-07 ENCOUNTER — Encounter: Payer: Self-pay | Admitting: Neurology

## 2016-10-07 ENCOUNTER — Ambulatory Visit (INDEPENDENT_AMBULATORY_CARE_PROVIDER_SITE_OTHER): Payer: 59 | Admitting: Neurology

## 2016-10-07 VITALS — BP 96/62 | HR 82 | Resp 16 | Ht 67.0 in | Wt 139.0 lb

## 2016-10-07 DIAGNOSIS — G4700 Insomnia, unspecified: Secondary | ICD-10-CM | POA: Diagnosis not present

## 2016-10-07 DIAGNOSIS — G35 Multiple sclerosis: Secondary | ICD-10-CM | POA: Diagnosis not present

## 2016-10-07 DIAGNOSIS — R5383 Other fatigue: Secondary | ICD-10-CM | POA: Diagnosis not present

## 2016-10-07 DIAGNOSIS — D329 Benign neoplasm of meninges, unspecified: Secondary | ICD-10-CM

## 2016-10-07 DIAGNOSIS — G43009 Migraine without aura, not intractable, without status migrainosus: Secondary | ICD-10-CM | POA: Diagnosis not present

## 2016-10-07 MED ORDER — ALPRAZOLAM 0.5 MG PO TABS: ORAL_TABLET | ORAL | 0 refills | Status: DC

## 2016-10-07 MED ORDER — IMIPRAMINE HCL 10 MG PO TABS: ORAL_TABLET | ORAL | 11 refills | Status: DC

## 2016-10-07 MED ORDER — MODAFINIL 200 MG PO TABS: 200.0000 mg | ORAL_TABLET | Freq: Every day | ORAL | 5 refills | Status: DC

## 2016-10-07 MED ORDER — TOPIRAMATE 50 MG PO TABS: ORAL_TABLET | ORAL | 11 refills | Status: DC

## 2016-10-07 MED FILL — TOPIRAMATE 50 MG TABLET: 50 | 30 days supply | Qty: 60 | Fill #0

## 2016-10-07 MED FILL — ALPRAZolam 0.5 MG TABS: 0.5 | 1 days supply | Qty: 2 | Fill #0

## 2016-10-07 MED FILL — IMIPRAMINE HCL 10 MG TABLET: 10 | 30 days supply | Qty: 60 | Fill #0

## 2016-10-07 NOTE — Telephone Encounter (Signed)
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 But she did inform me she is claustrophic and needs something calm her nerves. She states she like to medicine since to the pharmacy at [facility].

## 2016-10-07 NOTE — Telephone Encounter (Signed)
Rx. awaiting RAS sig/fim 

## 2016-10-07 NOTE — Progress Notes (Signed)
[facility]  PATIENT: Ashley May DOB: 19-Jan-1971  REFERRING DOCTOR OR PCP:  Einar Pheasant SOURCE: patient, records in EMR and MRI images on PACS/CD  _________________________________   HISTORICAL  CHIEF COMPLAINT:  Chief Complaint  Patient presents with  . Multiple May    Not on any dmt.  Sts. she is under more stress at work, related to short staffing, increased work/call hrs, change in shift hours.  Also increased h/a's, constant h/a for the last 2 weeks.  More forgetful, for ex., going in one room and forgetting what she went in there for. More fatigue./fim  . Meningioma  . Headaches    HISTORY OF PRESENT ILLNESS:  Ashley May is a 46 y.o. woman with multiple May.       MS:    She feels her MS has been stable. Her last exacerbation was more than 15 years ago. She had several exacerbations in the first 5 years of her disease.   Her last MRI in 2016 did not show any new lesions.  Headache:   She reports a HA with pain in the occiput and pressure in the forehead and eyes.  She notes more stress at work with more hours.    It has been constant x 10 days.   They were doing better adter starting Topamax at the last visit.    Gait/strength/sensation: She feels that her gait is normal. She is able to walk long distances without difficulty. She can take steps with her eyes closed and not fall. The significant weakness in her arms or legs. She will rarely get the Lhermite sign and/or tingling in the chest.  Bladder: She denies any significant problems with her bladder. She has no urgency, frequency or hesitancy. She has no nocturia.  Vision:   She denies any MS related vision problem.    She had optic neuritis shortly after diagnosis with complete recovery..  Fatigue/sleep: This is been changes in her work schedule with more hours, she is more tired. She also feels a little bit worse than she is in the heat. Sleep has been much worse the last  month.   She is sleeping only 5-6 hours due to changes in her schedule and difficulty with sleep maintenance.     She'll have nocturia once some nights.  Mood/cognition: She denies any depression or anxiety. She notes mild cognitive issues.    As an example, she goes into a room and is not sure why she is there and has mild forgetfulness   She notes this seems worse associated with longer hours at work (55-60 hours).   Meningioma:   MRIs have shown a small meningioma. This has been stable to slightly increased over many years.  MS History:   She presented in 1994 with a Lhermite sign and tingling in her hands. An MRI of the cervical spine revealed a cervical plaque. She also had an MRI of the brain and a lumbar puncture at that time. The studies were consistent with multiple May.   She was placed on Betaseron but had a lot of difficulty tolerating the medication. Specifically she would get severe myalgias and fatigue. Therefore, she used the medication intermittently over the next 7 years or so. In 2000, she delivered a healthy daughter. A few months later, she became pregnant again but this pregnancy was complicated by adenomyosis and she required a hysterectomy. Afterwards, she had an exacerbation. Her symptoms at that time included a poor gait with a Romberg  sign. She was placed on several days of IV steroids and improved. Around 2001, she stopped Betaseron and has not been on any therapy since. Most of this time, she was seeing Dr. Jacqulynn Cadet. She last saw him in 2014.   I first saw her May 2016.      Review of studies:   MRI of the brain dated 08/18/2015 and compared it to the prior MRI. There are white matter lesions in a pattern and configuration consistent with multiple May. They are all located in the hemispheres and there are no brainstem or cerebellar lesions.   Additionally, there is a 1.2 cm right frontal meningioma that was unchanged.   MRI of the cervical spine12/31/2007 shows midline  posterior plaque at C2 and left posterior plaque at C3.    She had another MRI of the brain 06/08/2012 compared to 2011 MRI shows a similar pattern and configuration of white matter foci. In the interval, there has only been the development of only 1 new small plaque in the right cerebral hemisphere. As before, there were no posterior fossa lesions. That study was done without contrast.  The meningioma looks about the same size. Chronic left maxillary and ethmoid sinusitis is noted.  FH:  Her brother (one year older) has primary progressive MS and has right hemiplegia.       REVIEW OF SYSTEMS: Constitutional: No fevers, chills, sweats, or change in appetite Eyes: No visual changes, double vision, eye pain Ear, nose and throat: No hearing loss, ear pain, nasal congestion, sore throat Cardiovascular: No chest pain, palpitations Respiratory: No shortness of breath at rest or with exertion.   No wheezes GastrointestinaI: No nausea, vomiting, diarrhea, abdominal pain, fecal incontinence Genitourinary: No dysuria, urinary retention or frequency.  No nocturia. Musculoskeletal: No neck pain, back pain Integumentary: No rash, pruritus, skin lesions Neurological: as above Psychiatric: No depression at this time.  No anxiety Endocrine: No palpitations, diaphoresis, change in appetite, change in weigh or increased thirst Hematologic/Lymphatic: No anemia, purpura, petechiae. Allergic/Immunologic: No itchy/runny eyes, nasal congestion, recent allergic reactions, rashes  ALLERGIES: Allergies  Allergen Reactions  . Gadolinium Derivatives Nausea And Vomiting    Pt began vomiting immed post gad inj of Multihance. 08/18/2014.  cap  . Eggs Or Egg-Derived Products Nausea And Vomiting  . Morphine Itching  . Prochlorperazine Edisylate Other (See Comments)    Compazine REACTION: anaphylactic shock    HOME MEDICATIONS:  Current Outpatient Prescriptions:  .  aspirin-acetaminophen-caffeine (EXCEDRIN  MIGRAINE) 250-250-65 MG tablet, Take by mouth every 6 (six) hours as needed for headache., Disp: , Rfl:  .  topiramate (TOPAMAX) 50 MG tablet, Take 1 or 2 tablets at bedtime., Disp: 60 tablet, Rfl: 11 .  imipramine (TOFRANIL) 10 MG tablet, Take one or two at bedtime., Disp: 60 tablet, Rfl: 11 .  modafinil (PROVIGIL) 200 MG tablet, Take 1 tablet (200 mg total) by mouth daily., Disp: 30 tablet, Rfl: 5  PAST MEDICAL HISTORY: Past Medical History:  Diagnosis Date  . Allergic rhinitis    not a problem at this time.  . Chronic headache    migraines  . Complication of anesthesia    slow to awaken after anesthesia with wisdom teeth  . Meningioma ([place])    "frontal"  . Multiple May ([place])   . Nausea    persistent nausea x 2 weeks  . PVC's (premature ventricular contractions)     PAST SURGICAL HISTORY: Past Surgical History:  Procedure Laterality Date  . AUGMENTATION MAMMAPLASTY    .  BREAST EXCISIONAL BIOPSY    . BREAST SURGERY  1994  . ESOPHAGOGASTRODUODENOSCOPY (EGD) WITH PROPOFOL N/A 07/05/2015   Procedure: ESOPHAGOGASTRODUODENOSCOPY (EGD) WITH PROPOFOL;  Surgeon: Arta Silence, MD;  Location: [facility];  Service: Endoscopy;  Laterality: N/A;  . KNEE ARTHROSCOPY W/ MENISCAL REPAIR     bilateral  . Meningioma/Pineal Cyst    . ORIF PROXIMAL TIBIAL PLATEAU FRACTURE Right   . TOTAL ABDOMINAL HYSTERECTOMY  2001  . WISDOM TOOTH EXTRACTION      FAMILY HISTORY: Family History  Problem Relation Age of Onset  . Heart disease Paternal Grandfather   . Stomach cancer Paternal Grandmother   . Colon cancer Paternal Grandmother   . Heart murmur Father   . Heart disease Father   . Heart failure Father     SOCIAL HISTORY:  Social History   Social History  . Marital status: Married    Spouse name: N/A  . Number of children: 2  . Years of education: N/A   Occupational History  . MRI Supervisor at [employer]  . Smoking status: Never Smoker    . Smokeless tobacco: Never Used  . Alcohol use No  . Drug use: No  . Sexual activity: Not on file   Other Topics Concern  . Not on file   Social History Narrative  . No narrative on file     PHYSICAL EXAM  Vitals:   10/07/16 0826  BP: 96/62  Pulse: 82  Resp: 16  Weight: 139 lb (63 kg)  Height: _0  (1.702 m)    Body mass index is 21.77 kg/m.   General: The patient is well-developed and well-nourished and in no acute distress  Eyes:  Funduscopic exam shows normal optic discs and retinal vessels.  Neurologic Exam  Mental status: The patient is alert and oriented x 3 at the time of the examination. The patient has apparent normal recent and remote memory, with an apparently normal attention span and concentration ability.   Speech is normal.  Cranial nerves: Extraocular muscles are intact. Facial strength and sensation is normal. Trapezius strength is normal. No dysarthria is noted.  The tongue is midline, and the patient has symmetric elevation of the soft palate. No obvious hearing deficits are noted.  Motor:  Muscle bulk is normal.   Tone is normal. Strength is  5 / 5 in all 4 extremities.   Sensory: Sensation is intact to touch and vibration..  Coordination: Cerebellar testing reveals good finger-nose-finger  Gait and station: Station is normal.   Gait is normal. Tandem gait is very minimally wide. Romberg is negative.   Reflexes: Deep tendon reflexes are brisk with spread at the knees.            DIAGNOSTIC DATA (LABS, IMAGING, TESTING) - I reviewed patient records, labs, notes, testing and imaging myself where available.  Lab Results  Component Value Date   WBC 8.7 03/28/2015   HGB 13.7 03/28/2015   HCT 41.5 03/28/2015   MCV 87.7 03/28/2015   PLT 389.0 03/28/2015      Component Value Date/Time   NA 140 03/28/2015 0950   K 4.0 03/28/2015 0950   CL 107 03/28/2015 0950   CO2 27 03/28/2015 0950   GLUCOSE 90 03/28/2015 0950   BUN 21 03/28/2015 0950    CREATININE 0.70 03/28/2015 0950   CALCIUM 9.3 03/28/2015 0950   PROT 7.3 03/28/2015 0950   ALBUMIN 4.0 03/28/2015 0950   AST 13 03/28/2015  0950   ALT 9 03/28/2015 0950   ALKPHOS 65 03/28/2015 0950   BILITOT 0.4 03/28/2015 0950   GFRNONAA 109.73 02/21/2010 1302   GFRAA  05/31/2009 1000    >60        The eGFR has been calculated using the MDRD equation. This calculation has not been validated in all clinical situations. eGFR's persistently <60 mL/min signify possible Chronic Kidney Disease.   Lab Results  Component Value Date   CHOL 126 03/28/2015   HDL 34.20 (L) 03/28/2015   LDLCALC 82 03/28/2015   TRIG 48.0 03/28/2015   CHOLHDL 4 03/28/2015   No results found for: HGBA1C No results found for: VITAMINB12 Lab Results  Component Value Date   TSH 1.20 03/28/2015       ASSESSMENT AND PLAN  Multiple May ([place]) - Plan: MR BRAIN W WO CONTRAST  Common migraine without intractability  Meningioma ([facility])  Insomnia, unspecified type  Other fatigue  1.   Her MS has been stable and she wishes to stay on a disease modifying therapy. Her last MRI was 2 years ago. We need to check another MRI to make sure that there has not been subclinical progression. If this is present, we will need to consider a disease modifying therapy. Marland Kitchen MRI will also allow [place] to keep an eye on the meningioma (very slight enlargment on last MRI).   Additionally, if she has a significant exacerbation she would reconsider a DMT 2. Continue Topamax 100 mg nightly and add imipramine 10-20 mg nightly. This should help her headache and also help her sleep maintenance. Additionally a prescription for Provigil was provided for her shift work issues and daytime sleepiness    consider a stimulant if this is not effective.  She will return to see me in 6 month or call sooner if he has new or worsening neurologic symptoms.  Bryce Kimble A. Felecia Shelling, MD, PhD 9/48/0165, 5:37 AM Certified in Neurology, Clinical  Neurophysiology, Sleep Medicine, Pain Medicine and Neuroimaging  [facility] [address] [place], [place] [address] [phone]

## 2016-10-07 NOTE — Telephone Encounter (Signed)
Rx. faxed to [facility]/fim

## 2016-10-08 MED FILL — MODAFINIL 200 MG TAB: 200 | 30 days supply | Qty: 30 | Fill #0

## 2016-10-16 DIAGNOSIS — M25562 Pain in left knee: Secondary | ICD-10-CM | POA: Diagnosis not present

## 2016-10-17 ENCOUNTER — Other Ambulatory Visit (HOSPITAL_COMMUNITY): Payer: Self-pay | Admitting: Orthopedic Surgery

## 2016-10-17 ENCOUNTER — Ambulatory Visit (HOSPITAL_COMMUNITY)
Admission: RE | Admit: 2016-10-17 | Discharge: 2016-10-17 | Disposition: A | Discharge status: Home / Self Care | Payer: 59 | Source: Ambulatory Visit | Attending: Orthopedic Surgery | Admitting: Orthopedic Surgery

## 2016-10-17 DIAGNOSIS — M1712 Unilateral primary osteoarthritis, left knee: Secondary | ICD-10-CM | POA: Diagnosis not present

## 2016-10-17 DIAGNOSIS — R609 Edema, unspecified: Secondary | ICD-10-CM | POA: Insufficient documentation

## 2016-10-17 DIAGNOSIS — M25562 Pain in left knee: Secondary | ICD-10-CM

## 2016-10-17 DIAGNOSIS — M25462 Effusion, left knee: Secondary | ICD-10-CM | POA: Insufficient documentation

## 2016-10-17 DIAGNOSIS — M23252 Derangement of posterior horn of lateral meniscus due to old tear or injury, left knee: Secondary | ICD-10-CM | POA: Diagnosis not present

## 2016-10-17 DIAGNOSIS — S8992XA Unspecified injury of left lower leg, initial encounter: Secondary | ICD-10-CM | POA: Diagnosis not present

## 2016-10-17 DIAGNOSIS — S838X2S Sprain of other specified parts of left knee, sequela: Secondary | ICD-10-CM

## 2016-10-18 DIAGNOSIS — M25562 Pain in left knee: Secondary | ICD-10-CM | POA: Diagnosis not present

## 2016-10-22 NOTE — H&P (Signed)
[facility] [address] [place], [place] [address] [phone] A Division of [facility]                                                                    RE: [place], [place]   04-15-2070  10-18-16 Reason for visit: Follow up left knee injury.  Date of injury: October 15, 2016. History of present illness: She had a twisting injury at [facility] on July 24th.  She had a pop at the time.  Has had painful catching in her knee with pain on the lateral side.  MRI demonstrates a lateral complex meniscal tear with a small cyst, as well as some IT band inflammation.  She has some inflammation within the ACL, but no obvious insufficiency.    EXAMINATION: Well appearing female in no apparent distress.  ACL has a firm end point with no increased translation.  Tenderness at her lateral joint line.    X-RAYS: MRI results noted above.  ASSESSMENT/PLAN: We had a long talk about her options.  At this point she has continued to have mechanical symptoms from this meniscus tear.  She has a beach trip coming up.  We will do a 40 mg Depo-Medrol injection into her knee for all of this inflammation and then we will look towards scheduling an arthroscopic chondroplasty, partial lateral meniscectomy.  PROCEDURE NOTE: The patient's clinical condition is marked by substantial pain and/or significant functional disability.  Other conservative therapy has not provided relief, is contraindicated, or not appropriate.  There is a reasonable likelihood that injection will significantly improve the patient's pain and/or functional disability. Patient is seated on the exam table.  The left knee is prepped with Betadine and alcohol and injected with 40 mg of Depo-Medrol and 2 cc of Marcaine.  Patient tolerated the procedure without difficulty.   Ernesta Amble.  Percell Miller, M.D.  Electronically verified by Ernesta Amble. Percell Miller, M.D. TDM:jjh Cc: Dr.  Ann Held, fax: [phone]  D 10-18-16 T 10-22-16

## 2016-10-23 DIAGNOSIS — S83289A Other tear of lateral meniscus, current injury, unspecified knee, initial encounter: Secondary | ICD-10-CM

## 2016-10-23 DIAGNOSIS — M2242 Chondromalacia patellae, left knee: Secondary | ICD-10-CM

## 2016-10-23 HISTORY — DX: Other tear of lateral meniscus, current injury, unspecified knee, initial encounter: S83.289A

## 2016-10-23 HISTORY — DX: Chondromalacia patellae, left knee: M22.42

## 2016-10-24 ENCOUNTER — Encounter (HOSPITAL_BASED_OUTPATIENT_CLINIC_OR_DEPARTMENT_OTHER): Payer: Self-pay | Admitting: *Deleted

## 2016-10-31 ENCOUNTER — Ambulatory Visit (HOSPITAL_BASED_OUTPATIENT_CLINIC_OR_DEPARTMENT_OTHER)
Admission: RE | Admit: 2016-10-31 | Discharge: 2016-10-31 | Disposition: A | Discharge status: Home / Self Care | Payer: 59 | Source: Ambulatory Visit | Attending: Orthopedic Surgery | Admitting: Orthopedic Surgery

## 2016-10-31 ENCOUNTER — Ambulatory Visit (HOSPITAL_BASED_OUTPATIENT_CLINIC_OR_DEPARTMENT_OTHER): Payer: 59 | Admitting: Anesthesiology

## 2016-10-31 ENCOUNTER — Encounter (HOSPITAL_BASED_OUTPATIENT_CLINIC_OR_DEPARTMENT_OTHER): Payer: Self-pay | Admitting: Anesthesiology

## 2016-10-31 ENCOUNTER — Encounter (HOSPITAL_BASED_OUTPATIENT_CLINIC_OR_DEPARTMENT_OTHER)
Admission: RE | Disposition: A | Discharge status: Home / Self Care | Payer: Self-pay | Source: Ambulatory Visit | Attending: Orthopedic Surgery

## 2016-10-31 DIAGNOSIS — X501XXA Overexertion from prolonged static or awkward postures, initial encounter: Secondary | ICD-10-CM | POA: Insufficient documentation

## 2016-10-31 DIAGNOSIS — M2242 Chondromalacia patellae, left knee: Secondary | ICD-10-CM | POA: Insufficient documentation

## 2016-10-31 DIAGNOSIS — Y9239 Other specified sports and athletic area as the place of occurrence of the external cause: Secondary | ICD-10-CM | POA: Insufficient documentation

## 2016-10-31 DIAGNOSIS — J329 Chronic sinusitis, unspecified: Secondary | ICD-10-CM | POA: Diagnosis not present

## 2016-10-31 DIAGNOSIS — S83282A Other tear of lateral meniscus, current injury, left knee, initial encounter: Secondary | ICD-10-CM | POA: Diagnosis not present

## 2016-10-31 DIAGNOSIS — Y93B9 Activity, other involving muscle strengthening exercises: Secondary | ICD-10-CM | POA: Diagnosis not present

## 2016-10-31 DIAGNOSIS — R002 Palpitations: Secondary | ICD-10-CM | POA: Diagnosis not present

## 2016-10-31 DIAGNOSIS — G35 Multiple sclerosis: Secondary | ICD-10-CM | POA: Insufficient documentation

## 2016-10-31 HISTORY — DX: Other specified postprocedural states: Z98.890

## 2016-10-31 HISTORY — DX: Nausea with vomiting, unspecified: R11.2

## 2016-10-31 HISTORY — DX: Other tear of lateral meniscus, current injury, unspecified knee, initial encounter: S83.289A

## 2016-10-31 HISTORY — PX: CHONDROPLASTY: SHX5177

## 2016-10-31 HISTORY — DX: Migraine, unspecified, not intractable, without status migrainosus: G43.909

## 2016-10-31 HISTORY — DX: Chondromalacia patellae, left knee: M22.42

## 2016-10-31 HISTORY — DX: Family history of other specified conditions: Z84.89

## 2016-10-31 HISTORY — PX: KNEE ARTHROSCOPY WITH LATERAL MENISECTOMY: SHX6193

## 2016-10-31 SURGERY — CHONDROPLASTY
Anesthesia: General | Site: Knee | Laterality: Left

## 2016-10-31 MED ORDER — PHENYLEPHRINE 40 MCG/ML (10ML) SYRINGE FOR IV PUSH (FOR BLOOD PRESSURE SUPPORT)
PREFILLED_SYRINGE | INTRAVENOUS | Status: AC
  Filled 2016-10-31: qty 10

## 2016-10-31 MED ORDER — LACTATED RINGERS IV SOLN
INTRAVENOUS | Status: DC
  Administered 2016-10-31: 07:00:00 via INTRAVENOUS

## 2016-10-31 MED ORDER — GABAPENTIN 300 MG PO CAPS
ORAL_CAPSULE | ORAL | Status: AC
  Filled 2016-10-31: qty 1

## 2016-10-31 MED ORDER — HYDROMORPHONE HCL 1 MG/ML IJ SOLN: 0.2500 mg | INTRAMUSCULAR | Status: DC | PRN

## 2016-10-31 MED ORDER — FENTANYL CITRATE (PF) 100 MCG/2ML IJ SOLN
INTRAMUSCULAR | Status: AC
  Filled 2016-10-31: qty 2

## 2016-10-31 MED ORDER — KETOROLAC TROMETHAMINE 30 MG/ML IJ SOLN
INTRAMUSCULAR | Status: DC | PRN
  Administered 2016-10-31: 30 mg via INTRAVENOUS

## 2016-10-31 MED ORDER — ONDANSETRON HCL 4 MG/2ML IJ SOLN
INTRAMUSCULAR | Status: AC
  Filled 2016-10-31: qty 2

## 2016-10-31 MED ORDER — OXYCODONE HCL 5 MG PO TABS
ORAL_TABLET | ORAL | Status: AC
  Filled 2016-10-31: qty 1

## 2016-10-31 MED ORDER — MIDAZOLAM HCL 2 MG/2ML IJ SOLN
INTRAMUSCULAR | Status: AC
  Filled 2016-10-31: qty 2

## 2016-10-31 MED ORDER — OXYCODONE HCL 5 MG/5ML PO SOLN: 5.0000 mg | Freq: Once | ORAL | Status: AC | PRN

## 2016-10-31 MED ORDER — EPHEDRINE 5 MG/ML INJ
INTRAVENOUS | Status: AC
  Filled 2016-10-31: qty 10

## 2016-10-31 MED ORDER — PROPOFOL 500 MG/50ML IV EMUL
INTRAVENOUS | Status: DC | PRN
  Administered 2016-10-31: 200 ug/kg/min via INTRAVENOUS

## 2016-10-31 MED ORDER — ACETAMINOPHEN 500 MG PO TABS
ORAL_TABLET | ORAL | Status: AC
  Filled 2016-10-31: qty 2

## 2016-10-31 MED ORDER — BUPIVACAINE-EPINEPHRINE (PF) 0.5% -1:200000 IJ SOLN
INTRAMUSCULAR | Status: AC
  Filled 2016-10-31: qty 120

## 2016-10-31 MED ORDER — PROPOFOL 500 MG/50ML IV EMUL
INTRAVENOUS | Status: AC
  Filled 2016-10-31: qty 50

## 2016-10-31 MED ORDER — BUPIVACAINE HCL (PF) 0.5 % IJ SOLN
INTRAMUSCULAR | Status: AC
  Filled 2016-10-31: qty 120

## 2016-10-31 MED ORDER — HYDROCODONE-ACETAMINOPHEN 5-325 MG PO TABS: 1.0000 | ORAL_TABLET | ORAL | 0 refills | Status: DC | PRN

## 2016-10-31 MED ORDER — ASPIRIN 81 MG PO TABS: 81.0000 mg | ORAL_TABLET | Freq: Every day | ORAL | 0 refills | Status: DC

## 2016-10-31 MED ORDER — ONDANSETRON HCL 4 MG PO TABS: 4.0000 mg | ORAL_TABLET | Freq: Three times a day (TID) | ORAL | 0 refills | Status: DC | PRN

## 2016-10-31 MED ORDER — FENTANYL CITRATE (PF) 100 MCG/2ML IJ SOLN
25.0000 ug | INTRAMUSCULAR | Status: DC | PRN
  Administered 2016-10-31: 50 ug via INTRAVENOUS

## 2016-10-31 MED ORDER — LIDOCAINE 2% (20 MG/ML) 5 ML SYRINGE
INTRAMUSCULAR | Status: DC | PRN
  Administered 2016-10-31: 60 mg via INTRAVENOUS

## 2016-10-31 MED ORDER — PROPOFOL 10 MG/ML IV BOLUS
INTRAVENOUS | Status: DC | PRN
  Administered 2016-10-31: 200 mg via INTRAVENOUS

## 2016-10-31 MED ORDER — SCOPOLAMINE 1 MG/3DAYS TD PT72
1.0000 | MEDICATED_PATCH | Freq: Once | TRANSDERMAL | Status: DC | PRN
Start: 2016-10-31 — End: 2016-10-31
  Administered 2016-10-31: 1.5 mg via TRANSDERMAL

## 2016-10-31 MED ORDER — FENTANYL CITRATE (PF) 100 MCG/2ML IJ SOLN
50.0000 ug | INTRAMUSCULAR | Status: DC | PRN
  Administered 2016-10-31: 100 ug via INTRAVENOUS
  Administered 2016-10-31: 50 ug via INTRAVENOUS

## 2016-10-31 MED ORDER — DEXAMETHASONE SODIUM PHOSPHATE 10 MG/ML IJ SOLN
INTRAMUSCULAR | Status: AC
  Filled 2016-10-31: qty 1

## 2016-10-31 MED ORDER — METOCLOPRAMIDE HCL 5 MG/ML IJ SOLN: 10.0000 mg | Freq: Once | INTRAMUSCULAR | Status: DC | PRN

## 2016-10-31 MED ORDER — CEFAZOLIN SODIUM-DEXTROSE 2-4 GM/100ML-% IV SOLN
2.0000 g | INTRAVENOUS | Status: AC
  Administered 2016-10-31: 2 g via INTRAVENOUS

## 2016-10-31 MED ORDER — BUPIVACAINE-EPINEPHRINE (PF) 0.25% -1:200000 IJ SOLN
INTRAMUSCULAR | Status: AC
  Filled 2016-10-31: qty 60

## 2016-10-31 MED ORDER — METHYLPREDNISOLONE ACETATE 40 MG/ML IJ SUSP
INTRAMUSCULAR | Status: AC
  Filled 2016-10-31: qty 1

## 2016-10-31 MED ORDER — MIDAZOLAM HCL 2 MG/2ML IJ SOLN
1.0000 mg | INTRAMUSCULAR | Status: DC | PRN
Start: 2016-10-31 — End: 2016-10-31
  Administered 2016-10-31: 2 mg via INTRAVENOUS

## 2016-10-31 MED ORDER — KETOROLAC TROMETHAMINE 30 MG/ML IJ SOLN
INTRAMUSCULAR | Status: AC
  Filled 2016-10-31: qty 1

## 2016-10-31 MED ORDER — MEPERIDINE HCL 25 MG/ML IJ SOLN: 6.2500 mg | INTRAMUSCULAR | Status: DC | PRN

## 2016-10-31 MED ORDER — OXYCODONE HCL 5 MG PO TABS
5.0000 mg | ORAL_TABLET | Freq: Once | ORAL | Status: AC | PRN
  Administered 2016-10-31: 5 mg via ORAL

## 2016-10-31 MED ORDER — LIDOCAINE 2% (20 MG/ML) 5 ML SYRINGE
INTRAMUSCULAR | Status: AC
  Filled 2016-10-31: qty 5

## 2016-10-31 MED ORDER — ACETAMINOPHEN 500 MG PO TABS
1000.0000 mg | ORAL_TABLET | Freq: Once | ORAL | Status: AC
  Administered 2016-10-31: 1000 mg via ORAL

## 2016-10-31 MED ORDER — CHLORHEXIDINE GLUCONATE 4 % EX LIQD: 60.0000 mL | Freq: Once | CUTANEOUS | Status: DC

## 2016-10-31 MED ORDER — GABAPENTIN 300 MG PO CAPS
300.0000 mg | ORAL_CAPSULE | Freq: Once | ORAL | Status: AC
  Administered 2016-10-31: 300 mg via ORAL

## 2016-10-31 MED ORDER — SODIUM CHLORIDE 0.9 % IR SOLN
Status: DC | PRN
  Administered 2016-10-31: 6000 mL

## 2016-10-31 MED ORDER — DEXAMETHASONE SODIUM PHOSPHATE 4 MG/ML IJ SOLN
INTRAMUSCULAR | Status: DC | PRN
  Administered 2016-10-31: 10 mg via INTRAVENOUS

## 2016-10-31 MED FILL — HYDROCODON-APAP 5-325: 5-325 | 5 days supply | Qty: 30 | Fill #0

## 2016-10-31 MED FILL — ONDANSETRON HCL 4 MG TABLET: 4 | 20 days supply | Qty: 60 | Fill #0

## 2016-10-31 MED FILL — ASPIRIN EC 81 MG TABLET: 81 | 30 days supply | Qty: 30 | Fill #0

## 2016-10-31 SURGICAL SUPPLY — 35 items
BANDAGE ACE 6X5 VEL STRL LF (GAUZE/BANDAGES/DRESSINGS) ×3 IMPLANT
BLADE CUDA 5.5 (BLADE) IMPLANT
BLADE CUDA GRT WHITE 3.5 (BLADE) IMPLANT
BLADE CUTTER GATOR 3.5 (BLADE) ×3 IMPLANT
BLADE CUTTER MENIS 5.5 (BLADE) IMPLANT
CHLORAPREP W/TINT 26ML (MISCELLANEOUS) ×3 IMPLANT
CUTTER MENISCUS  4.2MM (BLADE)
CUTTER MENISCUS 4.2MM (BLADE) IMPLANT
DRAPE ARTHROSCOPY W/POUCH 90 (DRAPES) ×3 IMPLANT
DRAPE IMP U-DRAPE 54X76 (DRAPES) ×3 IMPLANT
DRAPE U-SHAPE 47X51 STRL (DRAPES) ×3 IMPLANT
DRSG EMULSION OIL 3X3 NADH (GAUZE/BANDAGES/DRESSINGS) ×3 IMPLANT
GAUZE SPONGE 4X4 12PLY STRL (GAUZE/BANDAGES/DRESSINGS) ×6 IMPLANT
GLOVE BIO SURGEON STRL SZ7.5 (GLOVE) ×6 IMPLANT
GLOVE BIOGEL PI IND STRL 7.0 (GLOVE) IMPLANT
GLOVE BIOGEL PI IND STRL 8 (GLOVE) ×2 IMPLANT
GLOVE BIOGEL PI INDICATOR 7.0 (GLOVE) ×2
GLOVE BIOGEL PI INDICATOR 8 (GLOVE) ×8
GLOVE ECLIPSE 6.0 STRL STRAW (GLOVE) ×2 IMPLANT
GOWN STRL REUS W/ TWL LRG LVL3 (GOWN DISPOSABLE) ×3 IMPLANT
GOWN STRL REUS W/ TWL XL LVL3 (GOWN DISPOSABLE) ×1 IMPLANT
GOWN STRL REUS W/TWL LRG LVL3 (GOWN DISPOSABLE) ×9
GOWN STRL REUS W/TWL XL LVL3 (GOWN DISPOSABLE) ×3
KNEE WRAP E Z 3 GEL PACK (MISCELLANEOUS) ×2 IMPLANT
MANIFOLD NEPTUNE II (INSTRUMENTS) ×3 IMPLANT
PACK ARTHROSCOPY DSU (CUSTOM PROCEDURE TRAY) ×3 IMPLANT
PACK BASIN DAY SURGERY FS (CUSTOM PROCEDURE TRAY) ×3 IMPLANT
PROBE BIPOLAR ATHRO 135MM 90D (MISCELLANEOUS) IMPLANT
SET ARTHROSCOPY TUBING (MISCELLANEOUS) ×3
SET ARTHROSCOPY TUBING LN (MISCELLANEOUS) ×1 IMPLANT
SUT ETHILON 3 0 PS 1 (SUTURE) ×3 IMPLANT
TAPE CLOTH 3X10 TAN LF (GAUZE/BANDAGES/DRESSINGS) ×2 IMPLANT
TOWEL OR 17X24 6PK STRL BLUE (TOWEL DISPOSABLE) ×3 IMPLANT
TOWEL OR NON WOVEN STRL DISP B (DISPOSABLE) ×3 IMPLANT
WATER STERILE IRR 1000ML POUR (IV SOLUTION) ×3 IMPLANT

## 2016-10-31 NOTE — Anesthesia Procedure Notes (Signed)
Procedure Name: LMA Insertion Performed by: Shaqueta Casady W Pre-anesthesia Checklist: Patient identified, Emergency Drugs available, Suction available and Patient being monitored Patient Re-evaluated:Patient Re-evaluated prior to induction Oxygen Delivery Method: Circle system utilized Preoxygenation: Pre-oxygenation with 100% oxygen Induction Type: IV induction Ventilation: Mask ventilation without difficulty LMA: LMA inserted LMA Size: 4.0 Number of attempts: 1 Placement Confirmation: positive ETCO2 Tube secured with: Tape Dental Injury: Teeth and Oropharynx as per pre-operative assessment        

## 2016-10-31 NOTE — Transfer of Care (Signed)
Immediate Anesthesia Transfer of Care Note  Patient: Ashley May  Procedure(s) Performed: Procedure(s): LEFT KNEE CHONDROPLASTY (Left) LEFT KNEE ARTHROSCOPY WITH LATERAL MENISCECTOMY (Left)  Patient Location: PACU  Anesthesia Type:General  Level of Consciousness: awake and sedated  Airway & Oxygen Therapy: Patient Spontanous Breathing and Patient connected to face mask oxygen  Post-op Assessment: Report given to RN and Post -op Vital signs reviewed and stable  Post vital signs: Reviewed and stable  Last Vitals:  Vitals:   10/31/16 0652  BP: 109/70  Pulse: 74  Resp: 18  Temp: (!) 36.4 C    Last Pain:  Vitals:   10/31/16 0652  TempSrc: Oral         Complications: No apparent anesthesia complications

## 2016-10-31 NOTE — Anesthesia Preprocedure Evaluation (Addendum)
Anesthesia Evaluation  Patient identified by MRN, date of birth, ID band Patient awake    Reviewed: Allergy & Precautions, NPO status , Patient's Chart, lab work & pertinent test results  History of Anesthesia Complications (+) PROLONGED EMERGENCE and history of anesthetic complications (prolonged emergence with wisdom teeth extraction)  Airway Mallampati: I  TM Distance: >3 FB Neck ROM: Full    Dental  (+) Teeth Intact, Dental Advisory Given   Pulmonary neg pulmonary ROS,    Pulmonary exam normal breath sounds clear to auscultation       Cardiovascular Exercise Tolerance: Good Normal cardiovascular exam+ dysrhythmias (PVCs)  Rhythm:Regular Rate:Normal     Neuro/Psych  Headaches, Multiple sclerosis--last flare 16 yrs ago; denies any weakness currently  Right frontal meningioma negative psych ROS   GI/Hepatic Neg liver ROS, Nausea; denies vomiting   Endo/Other  negative endocrine ROS  Renal/GU negative Renal ROS     Musculoskeletal negative musculoskeletal ROS (+)   Abdominal   Peds  Hematology negative hematology ROS (+)   Anesthesia Other Findings Day of surgery medications reviewed with the patient.  Multiple Sclerosis  Reproductive/Obstetrics negative OB ROS                            Anesthesia Physical  Anesthesia Plan  ASA: II  Anesthesia Plan: General   Post-op Pain Management:    Induction: Intravenous  PONV Risk Score and Plan: 3 and Ondansetron, Dexamethasone and Midazolam  Airway Management Planned: LMA  Additional Equipment:   Intra-op Plan:   Post-operative Plan:   Informed Consent: I have reviewed the patients History and Physical, chart, labs and discussed the procedure including the risks, benefits and alternatives for the proposed anesthesia with the patient or authorized representative who has indicated his/her understanding and acceptance.   Dental  advisory given  Plan Discussed with: CRNA and Anesthesiologist  Anesthesia Plan Comments:         Anesthesia Quick Evaluation

## 2016-10-31 NOTE — Discharge Instructions (Signed)
Remove dressing on Sunday, ok to shower then and place bandaids on incisions WBAT     Post Anesthesia Home Care Instructions  Activity: Get plenty of rest for the remainder of the day. A responsible individual must stay with you for 24 hours following the procedure.  For the next 24 hours, DO NOT: -Drive a car -Paediatric nurse -Drink alcoholic beverages -Take any medication unless instructed by your physician -Make any legal decisions or sign important papers.  Meals: Start with liquid foods such as gelatin or soup. Progress to regular foods as tolerated. Avoid greasy, spicy, heavy foods. If nausea and/or vomiting occur, drink only clear liquids until the nausea and/or vomiting subsides. Call your physician if vomiting continues.  Special Instructions/Symptoms: Your throat may feel dry or sore from the anesthesia or the breathing tube placed in your throat during surgery. If this causes discomfort, gargle with warm salt water. The discomfort should disappear within 24 hours.  If you had a scopolamine patch placed behind your ear for the management of post- operative nausea and/or vomiting:  1. The medication in the patch is effective for 72 hours, after which it should be removed.  Wrap patch in a tissue and discard in the trash. Wash hands thoroughly with soap and water. 2. You may remove the patch earlier than 72 hours if you experience unpleasant side effects which may include dry mouth, dizziness or visual disturbances. 3. Avoid touching the patch. Wash your hands with soap and water after contact with the patch.   No IBUPROFEN products until after 2:30 on 10/31/16.

## 2016-10-31 NOTE — Op Note (Signed)
10/31/2016  8:19 AM  PATIENT:  Ashley May    PRE-OPERATIVE DIAGNOSIS:  CHONDROMALACIA PATELLAE LEFT KNEE, LATERAL MENISCUS TEAR M22.42 S83.282  POST-OPERATIVE DIAGNOSIS:  Same  PROCEDURE:  LEFT KNEE CHONDROPLASTY, LEFT KNEE ARTHROSCOPY WITH LATERAL MENISCECTOMY  SURGEON:  Bharath Bernstein D, MD  ASSISTANT: none  ANESTHESIA:   General  BLOOD LOSS: min  COMPLICATIONS: None   PREOPERATIVE INDICATIONS:  HELENA SARDO is a  46 y.o. female with a diagnosis of CHONDROMALACIA PATELLAE LEFT KNEE, [place] M22.42 [phone] who failed conservative measures and elected for surgical management.    The risks benefits and alternatives were discussed with the patient preoperatively including but not limited to the risks of infection, bleeding, nerve injury, cardiopulmonary complications, the need for revision surgery, among others, and the patient was willing to proceed.  OPERATIVE IMPLANTS: none  OPERATIVE FINDINGS: Examination under anesthesia: stable Diagnostic Arthroscopy:  articular cartilage:PF grade 1, lat grade 1 Medial meniscus:stable Lateral meniscus:radial tear Anterior cruciate ligament/PCL: stable Loose bodies: none    OPERATIVE PROCEDURE:  Patient was identified in the preoperative holding area and site was marked by me female was transported to the operating theater and placed on the table in supine position taking care to pad all bony prominences. After a preincinduction time out anesthesia was induced.  female received ancef for preoperative antibiotics. The left lower extremity was prepped and draped in normal sterile fashion and a pre-incision timeout was performed.   A small stab incision was made in the anterolateral portal position. The arthroscope was introduced in the joint. A medial portal was then established under direct visualization just above the anterior horn of the medial meniscus. Diagnostic arthroscopy was then carried out with findings as  described above.  I debrided the lateral meniscus with a partial meniscectomy. I was happy with remaining meniscus  I performed a PF chondroplasty of the grade chondromalacia  The arthroscopic equipment was removed from the joint and the portals were closed with 3-0 nylon in an interrupted fashion. The knee was infiltrated with Depo and marcaine.  Sterile dressings were then applied including Xeroform 4 x 4's ABDs an ACE bandage.  The patient was then allowed to awaken from general anesthesia, transferred to the stretcher and taken to the recovery room in stable condition.  POSTOPERATIVE PLAN: The patient will be discharged home today and will followup in one week for suture removal and wound check.  VTE prophylaxis: ambulation and ASA 81mg 

## 2016-10-31 NOTE — Anesthesia Postprocedure Evaluation (Signed)
Anesthesia Post Note  Patient: Ashley May  Procedure(s) Performed: Procedure(s) (LRB): LEFT KNEE CHONDROPLASTY (Left) LEFT KNEE ARTHROSCOPY WITH LATERAL MENISCECTOMY (Left)     Patient location during evaluation: PACU Anesthesia Type: General Level of consciousness: awake and alert Pain management: pain level controlled Vital Signs Assessment: post-procedure vital signs reviewed and stable Respiratory status: spontaneous breathing, nonlabored ventilation and respiratory function stable Cardiovascular status: blood pressure returned to baseline and stable Postop Assessment: no signs of nausea or vomiting Anesthetic complications: no    Last Vitals:  Vitals:   10/31/16 0900 10/31/16 0915  BP: 102/62   Pulse: (!) 55 65  Resp: 12 15  Temp:    SpO2: 100% 100%    Last Pain:  Vitals:   10/31/16 0915  TempSrc:   PainSc: [place]

## 2016-10-31 NOTE — Interval H&P Note (Signed)
History and Physical Interval Note:  10/31/2016 7:33 AM  [name]  has presented today for surgery, with the diagnosis of CHONDROMALACIA PATELLAE LEFT KNEE, LATERAL MENISCUS TEAR M22.42 [phone]  The various methods of treatment have been discussed with the patient and family. After consideration of risks, benefits and other options for treatment, the patient has consented to  Procedure(s): LEFT KNEE CHONDROPLASTY (Left) LEFT KNEE ARTHROSCOPY WITH LATERAL MENISCECTOMY (Left) as a surgical intervention .  The patient's history has been reviewed, patient examined, no change in status, stable for surgery.  I have reviewed the patient's chart and labs.  Questions were answered to the patient's satisfaction.     Ashley May

## 2016-11-01 ENCOUNTER — Encounter (HOSPITAL_BASED_OUTPATIENT_CLINIC_OR_DEPARTMENT_OTHER): Payer: Self-pay | Admitting: Orthopedic Surgery

## 2016-11-05 MED FILL — TOPIRAMATE 50 MG TABLET: 50 | 30 days supply | Qty: 60 | Fill #1

## 2016-11-05 NOTE — Addendum Note (Signed)
Addendum  created 11/05/16 1605 by Marrianne Mood, CRNA   Charge Capture section accepted

## 2016-11-13 DIAGNOSIS — S83282D Other tear of lateral meniscus, current injury, left knee, subsequent encounter: Secondary | ICD-10-CM | POA: Diagnosis not present

## 2016-11-26 MED FILL — IMIPRAMINE HCL 10 MG TABLET: 10 | 30 days supply | Qty: 60 | Fill #1

## 2016-11-29 MED FILL — TOPIRAMATE 50 MG TABLET: 50 | 30 days supply | Qty: 60 | Fill #2

## 2016-12-11 DIAGNOSIS — S83282D Other tear of lateral meniscus, current injury, left knee, subsequent encounter: Secondary | ICD-10-CM | POA: Diagnosis not present

## 2016-12-16 MED FILL — MODAFINIL 200 MG TABLET: 200 | 30 days supply | Qty: 30 | Fill #1

## 2016-12-17 DIAGNOSIS — M25562 Pain in left knee: Secondary | ICD-10-CM | POA: Diagnosis not present

## 2016-12-19 DIAGNOSIS — M25562 Pain in left knee: Secondary | ICD-10-CM | POA: Diagnosis not present

## 2016-12-23 DIAGNOSIS — M25562 Pain in left knee: Secondary | ICD-10-CM | POA: Diagnosis not present

## 2016-12-24 ENCOUNTER — Encounter: Payer: Self-pay | Admitting: *Deleted

## 2016-12-30 MED FILL — TOPIRAMATE 50 MG TABLET: 50 | 30 days supply | Qty: 60 | Fill #3

## 2016-12-31 MED FILL — IMIPRAMINE HCL 10 MG TABLET: 10 | 30 days supply | Qty: 60 | Fill #2

## 2017-01-14 DIAGNOSIS — M23301 Other meniscus derangements, unspecified lateral meniscus, left knee: Secondary | ICD-10-CM | POA: Diagnosis not present

## 2017-01-14 DIAGNOSIS — M25561 Pain in right knee: Secondary | ICD-10-CM | POA: Diagnosis not present

## 2017-01-20 ENCOUNTER — Telehealth: Payer: Self-pay | Admitting: Neurology

## 2017-01-20 ENCOUNTER — Ambulatory Visit (HOSPITAL_COMMUNITY)
Admission: RE | Admit: 2017-01-20 | Discharge: 2017-01-20 | Disposition: A | Discharge status: Home / Self Care | Payer: 59 | Source: Ambulatory Visit | Attending: Orthopedic Surgery | Admitting: Orthopedic Surgery

## 2017-01-20 ENCOUNTER — Other Ambulatory Visit (HOSPITAL_COMMUNITY): Payer: Self-pay | Admitting: Orthopedic Surgery

## 2017-01-20 DIAGNOSIS — M7989 Other specified soft tissue disorders: Secondary | ICD-10-CM | POA: Diagnosis not present

## 2017-01-20 DIAGNOSIS — M25561 Pain in right knee: Secondary | ICD-10-CM | POA: Insufficient documentation

## 2017-01-20 DIAGNOSIS — Z9889 Other specified postprocedural states: Secondary | ICD-10-CM | POA: Insufficient documentation

## 2017-01-20 DIAGNOSIS — M25462 Effusion, left knee: Secondary | ICD-10-CM | POA: Diagnosis not present

## 2017-01-20 DIAGNOSIS — S83282D Other tear of lateral meniscus, current injury, left knee, subsequent encounter: Secondary | ICD-10-CM

## 2017-01-20 MED ORDER — SCOPOLAMINE 1 MG/3DAYS TD PT72: 1.0000 | MEDICATED_PATCH | TRANSDERMAL | 0 refills | Status: DC

## 2017-01-20 MED FILL — TRANSDERM-SCOP 1.5 MG/3 DAY: 1 | 30 days supply | Qty: 10 | Fill #0

## 2017-01-20 NOTE — Telephone Encounter (Signed)
Scopalomine patches escribed to [facility]. Pt. aware/fim

## 2017-01-20 NOTE — Telephone Encounter (Signed)
Pt called said she is going to be flying and is wanting RX for patches for vertigo. She said these go behind the ear. Pharmacy: [facility]. Please call to discuss

## 2017-01-21 DIAGNOSIS — M2242 Chondromalacia patellae, left knee: Secondary | ICD-10-CM | POA: Diagnosis not present

## 2017-01-21 DIAGNOSIS — M659 Synovitis and tenosynovitis, unspecified: Secondary | ICD-10-CM | POA: Diagnosis not present

## 2017-01-21 DIAGNOSIS — M23301 Other meniscus derangements, unspecified lateral meniscus, left knee: Secondary | ICD-10-CM | POA: Diagnosis not present

## 2017-01-27 ENCOUNTER — Other Ambulatory Visit: Payer: Self-pay | Admitting: Internal Medicine

## 2017-01-27 ENCOUNTER — Ambulatory Visit: Payer: Self-pay

## 2017-01-27 ENCOUNTER — Ambulatory Visit
Admission: RE | Admit: 2017-01-27 | Discharge: 2017-01-27 | Disposition: A | Discharge status: Home / Self Care | Payer: 59 | Source: Ambulatory Visit | Attending: Internal Medicine | Admitting: Internal Medicine

## 2017-01-27 DIAGNOSIS — R922 Inconclusive mammogram: Secondary | ICD-10-CM | POA: Diagnosis not present

## 2017-01-27 DIAGNOSIS — N63 Unspecified lump in unspecified breast: Secondary | ICD-10-CM

## 2017-02-10 MED FILL — IMIPRAMINE HCL 10 MG TABLET: 10 | 30 days supply | Qty: 60 | Fill #3

## 2017-02-10 MED FILL — TOPIRAMATE 50 MG TABLET: 50 | 30 days supply | Qty: 60 | Fill #4

## 2017-02-11 MED FILL — MODAFINIL 200 MG TABS: 200 | 30 days supply | Qty: 30 | Fill #2

## 2017-03-04 DIAGNOSIS — M23352 Other meniscus derangements, posterior horn of lateral meniscus, left knee: Secondary | ICD-10-CM | POA: Diagnosis not present

## 2017-03-04 DIAGNOSIS — M659 Synovitis and tenosynovitis, unspecified: Secondary | ICD-10-CM | POA: Diagnosis not present

## 2017-03-13 ENCOUNTER — Encounter: Payer: Self-pay | Admitting: Internal Medicine

## 2017-03-13 ENCOUNTER — Ambulatory Visit (INDEPENDENT_AMBULATORY_CARE_PROVIDER_SITE_OTHER): Payer: 59 | Admitting: Internal Medicine

## 2017-03-13 DIAGNOSIS — J329 Chronic sinusitis, unspecified: Secondary | ICD-10-CM | POA: Diagnosis not present

## 2017-03-13 DIAGNOSIS — G35 Multiple sclerosis: Secondary | ICD-10-CM | POA: Diagnosis not present

## 2017-03-13 MED ORDER — AZITHROMYCIN 250 MG PO TABS: ORAL_TABLET | ORAL | 0 refills | Status: DC

## 2017-03-13 MED ORDER — AMOXICILLIN 875 MG PO TABS: 875.0000 mg | ORAL_TABLET | Freq: Two times a day (BID) | ORAL | 0 refills | Status: DC

## 2017-03-13 MED ORDER — SCOPOLAMINE 1 MG/3DAYS TD PT72: 1.0000 | MEDICATED_PATCH | TRANSDERMAL | 0 refills | Status: DC

## 2017-03-13 MED FILL — TRANSDERM-SCOP 1.5 MG/72HR: 1 | 30 days supply | Qty: 10 | Fill #0

## 2017-03-13 MED FILL — AZITHROMYCIN 250 MG TAB: 250 | 5 days supply | Qty: 6 | Fill #0

## 2017-03-13 MED FILL — AMOXICILLIN 875 MG TABLET: 875 | 10 days supply | Qty: 20 | Fill #0

## 2017-03-13 NOTE — Progress Notes (Signed)
Patient ID: Ashley May, female   DOB: [birth year], 46 y.o.   MRN: [id number]   Subjective:    Patient ID: Ashley May, female    DOB: Feb 06, 1971, 46 y.o.   MRN: [id number]  HPI  Patient here as a work in with concerns regarding increased cough and congestion.  Symptoms started two weeks ago.  Reports increased sinus drainage and thick mucus production (colored mucus).  Increased sinus pressure.  Change in her voice.  No sore throat.  Some increased chest congestion.  Some tightness in her chest.  Also has noticed a nodule - right neck - since this started.  Has been taking mucinex, dayquil and nyquil.  She also reports that she has noticed when she rolls over in bed a paralyzing feeling and a feeling as if someone is pushing down on her.  Resolves quickly and does not occur at any other time or with any other activity.  Noticed some light headedness with certain movements - since these symptoms started.     Past Medical History:  Diagnosis Date  . Chondromalacia of left patella 10/2016  . Complication of anesthesia    slow to awaken after anesthesia for wisdom teeth extraction  . Family history of adverse reaction to anesthesia    pt's father has hx. of being hard to wake up post-op  . Lateral meniscus tear 10/2016   left  . Meningioma ([place])    right frontal  . Migraines   . Multiple sclerosis ([place])   . PONV (postoperative nausea and vomiting)   . PVC's (premature ventricular contractions)    no current med.   Past Surgical History:  Procedure Laterality Date  . AUGMENTATION MAMMAPLASTY Bilateral    1994  . BREAST BIOPSY Right 08/14/2016  . BREAST ENHANCEMENT SURGERY    . CHONDROPLASTY Left 10/31/2016   Procedure: LEFT KNEE CHONDROPLASTY;  Surgeon: Renette Butters, MD;  Location: [place];  Service: Orthopedics;  Laterality: Left;  . ESOPHAGOGASTRODUODENOSCOPY (EGD) WITH PROPOFOL N/A 07/05/2015   Procedure: ESOPHAGOGASTRODUODENOSCOPY (EGD) WITH PROPOFOL;   Surgeon: Arta Silence, MD;  Location[facility];  Service: Endoscopy;  Laterality: N/A;  . KNEE ARTHROSCOPY WITH LATERAL MENISECTOMY Left 10/31/2016   Procedure: LEFT KNEE ARTHROSCOPY WITH LATERAL MENISCECTOMY;  Surgeon: Renette Butters, MD;  Location: [place];  Service: Orthopedics;  Laterality: Left;  . KNEE HARDWARE REMOVAL Right 11/30/2009   with debridement of scar tissue  . ORIF PROXIMAL TIBIAL PLATEAU FRACTURE Right 06/01/2009  . TOTAL VAGINAL HYSTERECTOMY  05/22/2000  . WISDOM TOOTH EXTRACTION     Family History  Problem Relation Age of Onset  . Heart disease Paternal Grandfather   . Stomach cancer Paternal Grandmother   . Colon cancer Paternal Grandmother   . Heart murmur Father   . Heart disease Father   . Heart failure Father   . Anesthesia problems Father        hard to wake up post-op   Social History   Socioeconomic History  . Marital status: Married    Spouse name: None  . Number of children: 2  . Years of education: None  . Highest education level: None  Social Needs  . Financial resource strain: None  . Food insecurity - worry: None  . Food insecurity - inability: None  . Transportation needs - medical: None  . Transportation needs - non-medical: None  Occupational History  . Occupation: MRI Librarian, academic at [employer]: [employer]  Tobacco Use  . Smoking status: Never Smoker  . Smokeless tobacco: Never Used  Substance and Sexual Activity  . Alcohol use: No    Alcohol/week: 0.0 oz  . Drug use: No  . Sexual activity: None  Other Topics Concern  . None  Social History Narrative  . None    Outpatient Encounter Medications as of 03/13/2017  Medication Sig  . cholecalciferol (VITAMIN D) 1000 units tablet Take 1,000 Units by mouth daily.  . Cyanocobalamin (VITAMIN B 12 PO) Take by mouth.  Marland Kitchen imipramine (TOFRANIL) 10 MG tablet Take 10 mg by mouth at bedtime.  . modafinil (PROVIGIL) 200 MG tablet Take 1 tablet (200 mg total) by  mouth daily.  . Multiple Vitamin (MULTIVITAMIN) tablet Take 1 tablet by mouth daily.  Marland Kitchen scopolamine (TRANSDERM-SCOP) 1 MG/3DAYS Place 1 patch (1.5 mg total) onto the skin every 3 (three) days.  Marland Kitchen topiramate (TOPAMAX) 100 MG tablet Take 100 mg by mouth at bedtime.  . [DISCONTINUED] scopolamine (TRANSDERM-SCOP) 1 MG/3DAYS Place 1 patch (1.5 mg total) onto the skin every 3 (three) days.  Marland Kitchen amoxicillin (AMOXIL) 875 MG tablet Take 1 tablet (875 mg total) by mouth 2 (two) times daily.  . [DISCONTINUED] aspirin 81 MG tablet Take 1 tablet (81 mg total) by mouth daily.  . [DISCONTINUED] azithromycin (ZITHROMAX) 250 MG tablet Take two tablets x 1 day and then 1 tablet per day for four more days.  . [DISCONTINUED] HYDROcodone-acetaminophen (NORCO) 5-325 MG tablet Take 1 tablet by mouth every 4 (four) hours as needed for moderate pain.  . [DISCONTINUED] ondansetron (ZOFRAN) 4 MG tablet Take 1 tablet (4 mg total) by mouth every 8 (eight) hours as needed for nausea or vomiting.   No facility-administered encounter medications on file as of 03/13/2017.     Review of Systems  Constitutional: Negative for appetite change and unexpected weight change.  HENT: Positive for congestion, postnasal drip, sinus pressure and voice change. Negative for sore throat.   Respiratory: Positive for cough. Negative for shortness of breath.   Cardiovascular: Negative for chest pain, palpitations and leg swelling.  Gastrointestinal: Negative for abdominal pain, diarrhea, nausea and vomiting.  Musculoskeletal: Negative for joint swelling and myalgias.  Skin: Negative for color change and rash.  Neurological: Positive for light-headedness. Negative for headaches.  Psychiatric/Behavioral: Negative for agitation and dysphoric mood.       Objective:    Physical Exam  Constitutional: She appears well-developed and well-nourished. No distress.  HENT:  Mouth/Throat: Oropharynx is clear and moist.  Minimal tenderness to  palpation over the sinuses.  Nares - slightly erythematous turbinates.  TMs without erythema.    Neck: Neck supple. No thyromegaly present.  Right cervical lymph node palpable.  Non tender.    Cardiovascular: Normal rate and regular rhythm.  Pulmonary/Chest: Breath sounds normal. No respiratory distress. She has no wheezes.  Abdominal: Bowel sounds are normal.  Skin: No rash noted. No erythema.  Psychiatric: She has a normal mood and affect. Her behavior is normal.    BP 116/64 (BP Location: Right Arm, Patient Position: Sitting, Cuff Size: Normal)   Pulse 84   Temp 99.2 F (37.3 C) (Oral)   Ht 5\' 7"  (1.702 m)   Wt 136 lb 12.8 oz (62.1 kg)   SpO2 97%   BMI 21.43 kg/m  Wt Readings from Last 3 Encounters:  03/13/17 136 lb 12.8 oz (62.1 kg)  10/31/16 131 lb 6 oz (59.6 kg)  10/07/16 139 lb (63 kg)  Lab Results  Component Value Date   WBC 8.7 03/28/2015   HGB 13.7 03/28/2015   HCT 41.5 03/28/2015   PLT 389.0 03/28/2015   GLUCOSE 90 03/28/2015   CHOL 126 03/28/2015   TRIG 48.0 03/28/2015   HDL 34.20 (L) 03/28/2015   LDLCALC 82 03/28/2015   ALT 9 03/28/2015   AST 13 03/28/2015   NA 140 03/28/2015   K 4.0 03/28/2015   CL 107 03/28/2015   CREATININE 0.70 03/28/2015   BUN 21 03/28/2015   CO2 27 03/28/2015   TSH 1.20 03/28/2015   INR 1.03 05/31/2009    Mm Diag Breast W/implant Tomo Uni R  Result Date: 01/27/2017 CLINICAL DATA:  Six-month follow-up after the ultrasound-guided core biopsy of the right breast revealing benign breast tissue in the 10 o'clock location. EXAM: 2D DIGITAL DIAGNOSTIC RIGHT MAMMOGRAM WITH IMPLANTS, CAD AND ADJUNCT TOMO The patient has retropectoral implants. Standard and implant displaced views were performed. COMPARISON:  08/14/2016 ACR Breast Density Category c: The breast tissue is heterogeneously dense, which may obscure small masses. FINDINGS: Ribbon shaped clip is identified in the upper-outer quadrant of the right breast following previous  benign biopsy of the right breast. No suspicious mass, distortion, or microcalcifications are identified to suggest presence of malignancy. Mammographic images were processed with CAD. IMPRESSION: No mammographic evidence for malignancy. RECOMMENDATION: Screening mammogram is recommended in May 2019. I have discussed the findings and recommendations with the patient. Results were also provided in writing at the conclusion of the visit. If applicable, a reminder letter will be sent to the patient regarding the next appointment. BI-RADS CATEGORY  1: Negative. Electronically Signed   By: Nolon Nations M.D.   On: 01/27/2017 07:52       Assessment & Plan:   Problem List Items Addressed This Visit    MULTIPLE SCLEROSIS    Seeing Dr Felecia Shelling.  Describes the sensation with rolling over as outlined.  Unclear etiology.  Treat infection.  She plans to discuss with her neurologist.  Overall she feels she is doing relatively well.       Sinusitis    Treatment planned as outlined.        Relevant Medications   amoxicillin (AMOXIL) 875 MG tablet   Sinusitis, chronic    Symptoms appear to be c/w sinusitis/URI.  Persistent despite otc medication.  Concern over bacterial infection.  Treat with amoxicillin as directed.  Probiotic as outlined.  Nasacort nasal spray and saline nasal spray as directed.  Mucinex/robitussin as directed.  Follow.  Rest.  Fluids.        Relevant Medications   amoxicillin (AMOXIL) 875 MG tablet       Einar Pheasant, MD

## 2017-03-13 NOTE — Patient Instructions (Signed)
Saline nasal spray - flush nose at least 2-3x/dau  nasacort nasal spray - 2 sprays each nostril one time per day.  Do this in the evening.    mucinex in the morning and robitussin in the evening.    Take a probiotic while you are on the antibiotics and for two weeks after completing the antibiotics.

## 2017-03-14 ENCOUNTER — Ambulatory Visit: Payer: Self-pay | Admitting: Internal Medicine

## 2017-03-16 DIAGNOSIS — J329 Chronic sinusitis, unspecified: Secondary | ICD-10-CM | POA: Insufficient documentation

## 2017-03-16 NOTE — Assessment & Plan Note (Signed)
Seeing Dr Felecia Shelling.  Describes the sensation with rolling over as outlined.  Unclear etiology.  Treat infection.  She plans to discuss with her neurologist.  Overall she feels she is doing relatively well.

## 2017-03-16 NOTE — Assessment & Plan Note (Signed)
Symptoms appear to be c/w sinusitis/URI.  Persistent despite otc medication.  Concern over bacterial infection.  Treat with amoxicillin as directed.  Probiotic as outlined.  Nasacort nasal spray and saline nasal spray as directed.  Mucinex/robitussin as directed.  Follow.  Rest.  Fluids.

## 2017-03-16 NOTE — Assessment & Plan Note (Signed)
Treatment planned as outlined.

## 2017-03-27 ENCOUNTER — Telehealth: Payer: Self-pay | Admitting: Internal Medicine

## 2017-03-27 MED FILL — IMIPRAMINE HCL 10 MG TABLET: 10 | 30 days supply | Qty: 60 | Fill #4

## 2017-03-27 MED FILL — TOPIRAMATE 50 MG TABLET: 50 | 30 days supply | Qty: 60 | Fill #5

## 2017-03-27 NOTE — Telephone Encounter (Signed)
Copied from [place] #[id number]. Topic: Quick Communication - See Telephone Encounter >> Mar 27, 2017 11:49 AM Percell Belt A wrote: CRM for notification. See Telephone encounter for:  pt called in and said that she was seen a couple of weeks ago for a sinus inf.  She has completed her meds but is still not feeling much better.  Now she is dizzy and she is also having burning when she goes to the bathroom.  She would like to know what she needs to do now?    Best number [phone]   03/27/17.

## 2017-03-27 NOTE — Telephone Encounter (Signed)
Patient was treated for bronchitis and upper respiratory. Patient has completed antibiotics but is still having dizziness (using transdermal patches) and drainage. She denies having a cough or fever. She has recently started having burning when she urinates but denies any other urinary symptoms. Please advise.

## 2017-03-27 NOTE — Telephone Encounter (Signed)
If having persistent dizziness and now with urinary symptoms, needs to be reevaluated.  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  Work in [employer]  Please notify pt and put on schedule if able to come in.

## 2017-03-27 NOTE — Telephone Encounter (Signed)
Patient scheduled for tomorrow

## 2017-03-28 ENCOUNTER — Ambulatory Visit: Payer: Self-pay | Admitting: Internal Medicine

## 2017-03-28 ENCOUNTER — Encounter: Payer: Self-pay | Admitting: Internal Medicine

## 2017-03-28 ENCOUNTER — Ambulatory Visit (INDEPENDENT_AMBULATORY_CARE_PROVIDER_SITE_OTHER): Payer: 59 | Admitting: Internal Medicine

## 2017-03-28 VITALS — BP 100/56 | HR 65 | Temp 98.7°F | Ht 67.0 in | Wt 132.4 lb

## 2017-03-28 DIAGNOSIS — J329 Chronic sinusitis, unspecified: Secondary | ICD-10-CM | POA: Diagnosis not present

## 2017-03-28 DIAGNOSIS — R300 Dysuria: Secondary | ICD-10-CM | POA: Diagnosis not present

## 2017-03-28 DIAGNOSIS — D329 Benign neoplasm of meninges, unspecified: Secondary | ICD-10-CM

## 2017-03-28 DIAGNOSIS — R42 Dizziness and giddiness: Secondary | ICD-10-CM | POA: Diagnosis not present

## 2017-03-28 LAB — POCT URINALYSIS DIPSTICK
Bilirubin, UA: NEGATIVE
GLUCOSE UA: NEGATIVE
Nitrite, UA: POSITIVE
Protein, UA: NEGATIVE
SPEC GRAV UA: 1.025 (ref 1.010–1.025)
Urobilinogen, UA: 0.2 E.U./dL
pH, UA: 6 (ref 5.0–8.0)

## 2017-03-28 LAB — URINALYSIS, MICROSCOPIC ONLY

## 2017-03-28 MED ORDER — AZELASTINE HCL 0.1 % NA SOLN: 1.0000 | Freq: Two times a day (BID) | NASAL | 0 refills | Status: DC

## 2017-03-28 NOTE — Progress Notes (Signed)
Pre visit review using our clinic review tool, if applicable. No additional management support is needed unless otherwise documented below in the visit note. 

## 2017-03-28 NOTE — Progress Notes (Addendum)
Patient ID: DAJANEE VOORHEIS, female   DOB: 1970/12/08, 47 y.o.   MRN: [id number]   Subjective:    Patient ID: CLARENE CURRAN, female    DOB: February 01, 1971, 47 y.o.   MRN: [id number]  HPI  Patient here as a work in with concerns dizziness and some burning with urination.  Was seen 03/13/17.  Treated for sinus infection.  Her head congestion is better.  No cough or chest congestion.  Some drainage.  No sinus pressure.  Reports persistent dizziness.  Has been wearing scopolamine patch.  Controls dizziness if wearing the patch.  When she takes the patch off, dizziness returns.  Dizziness not triggered by movement or rolling over.  No headache.  Has noticed some blurring of vision in her left eye.  She describes this as not being able to see as well up close.  No pain in her eyes.  No acid reflux.  No abdominal pain.  Does report dysuria.  Increased frequency.  Cloudy urine.  No vaginal discharge.  No diarrhea.     Past Medical History:  Diagnosis Date  . Chondromalacia of left patella 10/2016  . Complication of anesthesia    slow to awaken after anesthesia for wisdom teeth extraction  . Family history of adverse reaction to anesthesia    pt's father has hx. of being hard to wake up post-op  . Lateral meniscus tear 10/2016   left  . Meningioma ([place])    right frontal  . Migraines   . Multiple sclerosis ([place])   . PONV (postoperative nausea and vomiting)   . PVC's (premature ventricular contractions)    no current med.   Past Surgical History:  Procedure Laterality Date  . AUGMENTATION MAMMAPLASTY Bilateral    1994  . BREAST BIOPSY Right 08/14/2016  . BREAST ENHANCEMENT SURGERY    . CHONDROPLASTY Left 10/31/2016   Procedure: LEFT KNEE CHONDROPLASTY;  Surgeon: Renette Butters, MD;  Location: [place];  Service: Orthopedics;  Laterality: Left;  . ESOPHAGOGASTRODUODENOSCOPY (EGD) WITH PROPOFOL N/A 07/05/2015   Procedure: ESOPHAGOGASTRODUODENOSCOPY (EGD) WITH PROPOFOL;   Surgeon: Arta Silence, MD;  [facility];  Service: Endoscopy;  Laterality: N/A;  . KNEE ARTHROSCOPY WITH LATERAL MENISECTOMY Left 10/31/2016   Procedure: LEFT KNEE ARTHROSCOPY WITH LATERAL MENISCECTOMY;  Surgeon: Renette Butters, MD;  Location: [facility];  Service: Orthopedics;  Laterality: Left;  . KNEE HARDWARE REMOVAL Right 11/30/2009   with debridement of scar tissue  . ORIF PROXIMAL TIBIAL PLATEAU FRACTURE Right 06/01/2009  . TOTAL VAGINAL HYSTERECTOMY  05/22/2000  . WISDOM TOOTH EXTRACTION     Family History  Problem Relation Age of Onset  . Heart disease Paternal Grandfather   . Stomach cancer Paternal Grandmother   . Colon cancer Paternal Grandmother   . Heart murmur Father   . Heart disease Father   . Heart failure Father   . Anesthesia problems Father        hard to wake up post-op   Social History   Socioeconomic History  . Marital status: Married    Spouse name: None  . Number of children: 2  . Years of education: None  . Highest education level: None  Social Needs  . Financial resource strain: None  . Food insecurity - worry: None  . Food insecurity - inability: None  . Transportation needs - medical: None  . Transportation needs - non-medical: None  Occupational History  . Occupation: Radio broadcast assistant at [employer]  Employer: [employer]  Tobacco Use  . Smoking status: Never Smoker  . Smokeless tobacco: Never Used  Substance and Sexual Activity  . Alcohol use: No    Alcohol/week: 0.0 oz  . Drug use: No  . Sexual activity: None  Other Topics Concern  . None  Social History Narrative  . None    Outpatient Encounter Medications as of 03/28/2017  Medication Sig  . cholecalciferol (VITAMIN D) 1000 units tablet Take 1,000 Units by mouth daily.  . Cyanocobalamin (VITAMIN B 12 PO) Take by mouth.  Marland Kitchen imipramine (TOFRANIL) 10 MG tablet Take 10 mg by mouth at bedtime.  . modafinil (PROVIGIL) 200 MG tablet Take 1 tablet (200 mg total) by  mouth daily.  . Multiple Vitamin (MULTIVITAMIN) tablet Take 1 tablet by mouth daily.  Marland Kitchen scopolamine (TRANSDERM-SCOP) 1 MG/3DAYS Place 1 patch (1.5 mg total) onto the skin every 3 (three) days.  Marland Kitchen topiramate (TOPAMAX) 100 MG tablet Take 100 mg by mouth at bedtime.  . [DISCONTINUED] amoxicillin (AMOXIL) 875 MG tablet Take 1 tablet (875 mg total) by mouth 2 (two) times daily.  Marland Kitchen azelastine (ASTELIN) 0.1 % nasal spray Place 1 spray into both nostrils 2 (two) times daily. Use in each nostril as directed  . [DISCONTINUED] azelastine (ASTELIN) 0.1 % nasal spray Place 1 spray into both nostrils 2 (two) times daily. Use in each nostril as directed   No facility-administered encounter medications on file as of 03/28/2017.     Review of Systems  Constitutional: Negative for appetite change and fever.  HENT: Positive for congestion and postnasal drip. Negative for sinus pressure.   Respiratory: Negative for cough, chest tightness and shortness of breath.   Cardiovascular: Negative for chest pain, palpitations and leg swelling.  Gastrointestinal: Negative for abdominal pain, diarrhea, nausea and vomiting.  Genitourinary: Positive for dysuria and frequency. Negative for difficulty urinating and vaginal discharge.  Musculoskeletal: Negative for joint swelling and myalgias.  Skin: Negative for color change and rash.  Neurological: Positive for dizziness. Negative for headaches.  Psychiatric/Behavioral: Negative for agitation and dysphoric mood.       Objective:    Physical Exam  Constitutional: She appears well-developed and well-nourished. No distress.  HENT:  Nose: Nose normal.  Mouth/Throat: Oropharynx is clear and moist.  Neck: Neck supple. No thyromegaly present.  Cardiovascular: Normal rate and regular rhythm.  Pulmonary/Chest: Breath sounds normal. No respiratory distress. She has no wheezes.  Abdominal: Soft. Bowel sounds are normal. There is no tenderness.  Musculoskeletal: She exhibits no  edema or tenderness.  Lymphadenopathy:    She has no cervical adenopathy.  Skin: No rash noted. No erythema.  Psychiatric: She has a normal mood and affect. Her behavior is normal.    BP (!) 100/56   Pulse 65   Temp 98.7 F (37.1 C) (Oral)   Ht 5\' 7"  (1.702 m)   Wt 132 lb 6.4 oz (60.1 kg)   SpO2 95%   BMI 20.74 kg/m  Wt Readings from Last 3 Encounters:  03/28/17 132 lb 6.4 oz (60.1 kg)  03/13/17 136 lb 12.8 oz (62.1 kg)  10/31/16 131 lb 6 oz (59.6 kg)     Lab Results  Component Value Date   WBC 8.7 03/28/2015   HGB 13.7 03/28/2015   HCT 41.5 03/28/2015   PLT 389.0 03/28/2015   GLUCOSE 90 03/28/2015   CHOL 126 03/28/2015   TRIG 48.0 03/28/2015   HDL 34.20 (L) 03/28/2015   LDLCALC 82 03/28/2015   ALT 9  03/28/2015   AST 13 03/28/2015   NA 140 03/28/2015   K 4.0 03/28/2015   CL 107 03/28/2015   CREATININE 0.70 03/28/2015   BUN 21 03/28/2015   CO2 27 03/28/2015   TSH 1.20 03/28/2015   INR 1.03 05/31/2009    Mm Diag Breast W/implant Tomo Uni R  Result Date: 01/27/2017 CLINICAL DATA:  Six-month follow-up after the ultrasound-guided core biopsy of the right breast revealing benign breast tissue in the 10 o'clock location. EXAM: 2D DIGITAL DIAGNOSTIC RIGHT MAMMOGRAM WITH IMPLANTS, CAD AND ADJUNCT TOMO The patient has retropectoral implants. Standard and implant displaced views were performed. COMPARISON:  08/14/2016 ACR Breast Density Category c: The breast tissue is heterogeneously dense, which may obscure small masses. FINDINGS: Ribbon shaped clip is identified in the upper-outer quadrant of the right breast following previous benign biopsy of the right breast. No suspicious mass, distortion, or microcalcifications are identified to suggest presence of malignancy. Mammographic images were processed with CAD. IMPRESSION: No mammographic evidence for malignancy. RECOMMENDATION: Screening mammogram is recommended in May 2019. I have discussed the findings and recommendations  with the patient. Results were also provided in writing at the conclusion of the visit. If applicable, a reminder letter will be sent to the patient regarding the next appointment. BI-RADS CATEGORY  1: Negative. Electronically Signed   By: Nolon Nations M.D.   On: 01/27/2017 07:52       Assessment & Plan:   Problem List Items Addressed This Visit    Dizziness    Persistent.  Sinus symptoms improved.  Unclear etiology.  Wearing scopolamine patches.  No dizziness while wearing the patch.  Flares when off.  Also with some blurring of vision in her left eye.  No loss of vision.  No other neurological changes.  Unclear etiology.  Discussed f/u scan.  Sees neurology for her MS.  Last MRI stable.  Have ENT evaluate.  Also notify Dr Felecia Shelling.  Confirm not MS flare.        Relevant Orders   Ambulatory referral to ENT   Meningioma [facility])    Has been followed by neurology. Stable.       Sinusitis    Recently treated.  Symptoms cleared.  Better.  Follow.        Relevant Medications   azelastine (ASTELIN) 0.1 % nasal spray    Other Visit Diagnoses    Dysuria    -  Primary   symptoms as outlined.  check urine to confirm no infection.     Relevant Orders   Urine Culture (Completed)   Urine Microscopic (Completed)   POCT Urinalysis Dipstick (Completed)       Einar Pheasant, MD

## 2017-03-30 ENCOUNTER — Other Ambulatory Visit: Payer: Self-pay | Admitting: Internal Medicine

## 2017-03-30 LAB — URINE CULTURE
MICRO NUMBER: 90014866
SPECIMEN QUALITY:: ADEQUATE

## 2017-03-30 MED ORDER — CEFDINIR 300 MG PO CAPS: 300.0000 mg | ORAL_CAPSULE | Freq: Two times a day (BID) | ORAL | 0 refills | Status: DC

## 2017-03-30 NOTE — Progress Notes (Signed)
rx for omnicef sent to [facility]

## 2017-03-31 ENCOUNTER — Encounter: Payer: Self-pay | Admitting: Internal Medicine

## 2017-03-31 DIAGNOSIS — M23352 Other meniscus derangements, posterior horn of lateral meniscus, left knee: Secondary | ICD-10-CM | POA: Diagnosis not present

## 2017-03-31 NOTE — Assessment & Plan Note (Addendum)
Persistent.  Sinus symptoms improved.  Unclear etiology.  Wearing scopolamine patches.  No dizziness while wearing the patch.  Flares when off.  Also with some blurring of vision in her left eye.  No loss of vision.  No other neurological changes.  Unclear etiology.  Discussed f/u scan.  Sees neurology for her MS.  Last MRI stable.  Have ENT evaluate.  Also notify Dr Felecia Shelling.  Confirm not MS flare.

## 2017-03-31 NOTE — Assessment & Plan Note (Signed)
Recently treated.  Symptoms cleared.  Better.  Follow.

## 2017-03-31 NOTE — Assessment & Plan Note (Signed)
Has been followed by neurology.  Stable.  

## 2017-04-02 ENCOUNTER — Telehealth: Payer: Self-pay | Admitting: Internal Medicine

## 2017-04-02 ENCOUNTER — Telehealth: Payer: Self-pay | Admitting: Neurology

## 2017-04-02 NOTE — Telephone Encounter (Signed)
Spoke with Horris Latino.  She has had repeated infections (sinus infection, URI, then UTI). Sts. she has had 4 episodes of feeling partial paralysis when turning over from her side to her back.  Feels as if someone is pushing her down into the bed. Brief episodes. Doesn't thinks she is more tired during the day. Per RAS, I have advised that if episodes continue, will give appt. to discuss.  Nortrip 25mg  qhs is an option.  She verbalized understanding of same/fim

## 2017-04-02 NOTE — Telephone Encounter (Signed)
Patient is calling. She has been having an intermittent paralyzing feeling for the past 2 weeks only when she rolls out of bed.

## 2017-04-02 NOTE — Telephone Encounter (Signed)
Notified pt of Dr Garth Bigness response.  Pt states she is still wearing the patch,  Eating and drinking ok. Had the shock/pressure sensation (like someone pushing her down) - last night.  Discussed with her.  She plans to call Dr Garth Bigness office today with update of her symptoms to confirm nothing more needs to be done.  She is planning to have surgery next week.  Has ENT appt scheduled.

## 2017-04-02 NOTE — Telephone Encounter (Signed)
LMTC./fim 

## 2017-04-02 NOTE — Telephone Encounter (Signed)
-----   Message from Britt Bottom, MD sent at 03/31/2017  6:25 PM EST ----- Regarding: RE: persistent dizziness Dr. Nicki Reaper, Thank you for keeping me up-to-date with Ms. Ashley May.   The dizziness could be MS related although her MS has been fairly well controlled.    Usually this would just be treated symptomatically (IV steroids usually just have benefit if an exacerbation is treated in the first week).   We can hold off on an MRI at this time.  If she reports any new or worsening symptoms to you, please have her contact our office we will get her in right away.  Thank you Richard ----- Message ----- From: Einar Pheasant, MD Sent: 03/31/2017   5:38 AM To: Britt Bottom, MD Subject: persistent dizziness                           I recently saw Ms Ashley May (03/13/17) for what was felt to be a sinus infection.  Treated.  Her sinus symptoms have essentially resolved.  She is having persistent dizziness.  Currently wearing what sounds like a scopolamine patch.  While wearing the patch, has no symptoms.  When removes, symptoms return and are described as continuous dizziness (does not worsen with position changes or movements such as rolling over in bed, etc).  She also reports noticing some blurring of vision in her left eye that she describes as not being able to read things as well (just in left).  I am referring her to ENT.  Given her history of MS, I wanted to get your opinion about any other neurological work up.  She had wanted to hold on f/u MRI scan unless necessary.  Thank you for your help with this patient.    Einar Pheasant

## 2017-04-03 ENCOUNTER — Other Ambulatory Visit: Payer: Self-pay | Admitting: Internal Medicine

## 2017-04-03 NOTE — Telephone Encounter (Signed)
ok'd refill x 1.  She has upcoming appt with ENT.  Notify pt - (plan to have evaluation and be able to get off patches).  Did ok refill x 1.

## 2017-04-03 NOTE — Telephone Encounter (Signed)
Okay to refill? Last written on: 03/13/17 for #10

## 2017-04-08 DIAGNOSIS — Z885 Allergy status to narcotic agent status: Secondary | ICD-10-CM | POA: Diagnosis not present

## 2017-04-08 DIAGNOSIS — S8332XA Tear of articular cartilage of left knee, current, initial encounter: Secondary | ICD-10-CM | POA: Diagnosis not present

## 2017-04-08 DIAGNOSIS — S83282A Other tear of lateral meniscus, current injury, left knee, initial encounter: Secondary | ICD-10-CM | POA: Diagnosis not present

## 2017-04-08 DIAGNOSIS — S8332XS Tear of articular cartilage of left knee, current, sequela: Secondary | ICD-10-CM | POA: Diagnosis not present

## 2017-04-08 DIAGNOSIS — M659 Synovitis and tenosynovitis, unspecified: Secondary | ICD-10-CM | POA: Diagnosis not present

## 2017-04-09 DIAGNOSIS — R42 Dizziness and giddiness: Secondary | ICD-10-CM | POA: Diagnosis not present

## 2017-04-09 DIAGNOSIS — G35 Multiple sclerosis: Secondary | ICD-10-CM | POA: Diagnosis not present

## 2017-04-10 ENCOUNTER — Encounter: Payer: Self-pay | Admitting: Neurology

## 2017-04-10 ENCOUNTER — Other Ambulatory Visit: Payer: Self-pay

## 2017-04-10 ENCOUNTER — Ambulatory Visit: Payer: 59 | Admitting: Neurology

## 2017-04-10 VITALS — BP 121/71 | HR 86 | Resp 14 | Ht 67.0 in | Wt 131.0 lb

## 2017-04-10 DIAGNOSIS — R5383 Other fatigue: Secondary | ICD-10-CM

## 2017-04-10 DIAGNOSIS — R202 Paresthesia of skin: Secondary | ICD-10-CM | POA: Diagnosis not present

## 2017-04-10 DIAGNOSIS — R42 Dizziness and giddiness: Secondary | ICD-10-CM

## 2017-04-10 DIAGNOSIS — G35 Multiple sclerosis: Secondary | ICD-10-CM

## 2017-04-10 DIAGNOSIS — D329 Benign neoplasm of meninges, unspecified: Secondary | ICD-10-CM

## 2017-04-10 MED ORDER — SCOPOLAMINE 1 MG/3DAYS TD PT72: 1.0000 | MEDICATED_PATCH | TRANSDERMAL | 12 refills | Status: DC

## 2017-04-10 MED ORDER — MODAFINIL 200 MG PO TABS: 200.0000 mg | ORAL_TABLET | Freq: Every day | ORAL | 1 refills | Status: DC

## 2017-04-10 MED ORDER — IMIPRAMINE HCL 10 MG PO TABS: 20.0000 mg | ORAL_TABLET | Freq: Every day | ORAL | 4 refills | Status: DC

## 2017-04-10 MED ORDER — TOPIRAMATE 100 MG PO TABS: 100.0000 mg | ORAL_TABLET | Freq: Every day | ORAL | 4 refills | Status: DC

## 2017-04-10 NOTE — Progress Notes (Signed)
[facility]  PATIENT: Ashley May DOB: [birth year]  REFERRING DOCTOR OR PCP:  Ashley May SOURCE: patient, records in EMR and MRI images on PACS/CD  _________________________________   HISTORICAL  CHIEF COMPLAINT:  Chief Complaint  Patient presents with  . Multiple Sclerosis    Not on any dmt. Recent right knee surgery.  Recent sinus infection, with dizziness that started after infection was dx.  Scopolomine patches help, but dizziness returns if she takes the patch off.  Sts. ENT feels dizziness is related to MS/fim  . Meningioma  . Headache    HISTORY OF PRESENT ILLNESS:  Ashley May is a 47 y.o. woman with multiple sclerosis.       Update 04/10/2017: She had a sinus infection and URI concurrently.  She began to note vertigo and reduced balance.    With a scopolamine patch, she did better but vertigo returned.    She took 10-14 days of amoxacillin.    Then she had a UTI and was on a cephalosporin.    She then had knee surgery 2 days ago.    With all these issues, she has noted that the vertigo returns 1 day or two after stopping scopolamine. She does not note veering with walking and the vertigo is not clearly a spinning or translational feeling but more like motion sickness like in the back seat of a car.   Her ENT doctor did not feel that this was due to her ear/vestibular system but rather MS.     Her last exacerbation was at least 15 years ago.   Her last MRI was in 2016 and was stable.   She has not any disease modifying therapy.  She feels gait has ben stabvle otehrwise.   No new numbness, weakness or bladder change.   Vision is unchanged.      Fatigue has been doing ok, even with infections.    Sleep is good.    Mood has been stale.   Cognition is fine.      From 10/07/2016 MS:    She feels her MS has been stable. Her last exacerbation was more than 15 years ago. She had several exacerbations in the first 5 years of her disease.   Her last MRI  in 2016 did not show any new lesions.  Headache:   She reports a HA with pain in the occiput and pressure in the forehead and eyes.  She notes more stress at work with more hours.    It has been constant x 10 days.   They were doing better adter starting Topamax at the last visit.    Gait/strength/sensation: She feels that her gait is normal. She is able to walk long distances without difficulty. She can take steps with her eyes closed and not fall. The significant weakness in her arms or legs. She will rarely get the Lhermite sign and/or tingling in the chest.  Bladder: She denies any significant problems with her bladder. She has no urgency, frequency or hesitancy. She has no nocturia.  Vision:   She denies any MS related vision problem.    She had optic neuritis shortly after diagnosis with complete recovery..  Fatigue/sleep: This is been changes in her work schedule with more hours, she is more tired. She also feels a little bit worse than she is in the heat. Sleep has been much worse the last month.   She is sleeping only 5-6 hours due to changes in her schedule and  difficulty with sleep maintenance.     She'll have nocturia once some nights.  Mood/cognition: She denies any depression or anxiety. She notes mild cognitive issues.    As an example, she goes into a room and is not sure why she is there and has mild forgetfulness   She notes this seems worse associated with longer hours at work (55-60 hours).   Meningioma:   MRIs have shown a small meningioma. This has been stable to slightly increased over many years.  MS History:   She presented in 1994 with a Lhermite sign and tingling in her hands. An MRI of the cervical spine revealed a cervical plaque. She also had an MRI of the brain and a lumbar puncture at that time. The studies were consistent with multiple sclerosis.   She was placed on Betaseron but had a lot of difficulty tolerating the medication. Specifically she would get severe  myalgias and fatigue. Therefore, she used the medication intermittently over the next 7 years or so. In 2000, she delivered a healthy daughter. A few months later, she became pregnant again but this pregnancy was complicated by adenomyosis and she required a hysterectomy. Afterwards, she had an exacerbation. Her symptoms at that time included a poor gait with a Romberg sign. She was placed on several days of IV steroids and improved. Around 2001, she stopped Betaseron and has not been on any therapy since. Most of this time, she was seeing Ashley May. She last saw him in 2014.   I first saw her May 2016.      Review of studies:   MRI of the brain dated 08/18/2015 and compared it to the prior MRI. There are white matter lesions in a pattern and configuration consistent with multiple sclerosis. They are all located in the hemispheres and there are no brainstem or cerebellar lesions.   Additionally, there is a 1.2 cm right frontal meningioma that was unchanged.   MRI of the cervical spine12/31/2007 shows midline posterior plaque at C2 and left posterior plaque at C3.    She had another MRI of the brain 06/08/2012 compared to 2011 MRI shows a similar pattern and configuration of white matter foci. In the interval, there has only been the development of only 1 new small plaque in the right cerebral hemisphere. As before, there were no posterior fossa lesions. That study was done without contrast.  The meningioma looks about the same size. Chronic left maxillary and ethmoid sinusitis is noted.  FH:  Her brother (one year older) has primary progressive MS and has right hemiplegia.       REVIEW OF SYSTEMS: Constitutional: No fevers, chills, sweats, or change in appetite Eyes: No visual changes, double vision, eye pain Ear, nose and throat: No hearing loss, ear pain, nasal congestion, sore throat Cardiovascular: No chest pain, palpitations Respiratory: No shortness of breath at rest or with exertion.   No  wheezes GastrointestinaI: No nausea, vomiting, diarrhea, abdominal pain, fecal incontinence Genitourinary: No dysuria, urinary retention or frequency.  No nocturia. Musculoskeletal: No neck pain, back pain Integumentary: No rash, pruritus, skin lesions Neurological: as above Psychiatric: No depression at this time.  No anxiety Endocrine: No palpitations, diaphoresis, change in appetite, change in weigh or increased thirst Hematologic/Lymphatic: No anemia, purpura, petechiae. Allergic/Immunologic: No itchy/runny eyes, nasal congestion, recent allergic reactions, rashes  ALLERGIES: Allergies  Allergen Reactions  . Prochlorperazine Edisylate Anaphylaxis  . Eggs Or Egg-Derived Products Nausea And Vomiting  . Gadolinium Derivatives Nausea And Vomiting  .  Morphine Itching    HOME MEDICATIONS:  Current Outpatient Medications:  .  azelastine (ASTELIN) 0.1 % nasal spray, Place 1 spray into both nostrils 2 (two) times daily. Use in each nostril as directed, Disp: 30 mL, Rfl: 0 .  cholecalciferol (VITAMIN D) 1000 units tablet, Take 1,000 Units by mouth daily., Disp: , Rfl:  .  Cyanocobalamin (VITAMIN B 12 PO), Take by mouth., Disp: , Rfl:  .  imipramine (TOFRANIL) 10 MG tablet, Take 2 tablets (20 mg total) by mouth at bedtime., Disp: 180 tablet, Rfl: 4 .  modafinil (PROVIGIL) 200 MG tablet, Take 1 tablet (200 mg total) by mouth daily., Disp: 90 tablet, Rfl: 1 .  Multiple Vitamin (MULTIVITAMIN) tablet, Take 1 tablet by mouth daily., Disp: , Rfl:  .  topiramate (TOPAMAX) 100 MG tablet, Take 1 tablet (100 mg total) by mouth at bedtime., Disp: 90 tablet, Rfl: 4 .  TRANSDERM-SCOP, 1.5 MG, 1 MG/3DAYS, APPLY 1 PATCH ONTO SKIN EVERY 3 DAYS, Disp: 10 patch, Rfl: 0 .  cefdinir (OMNICEF) 300 MG capsule, Take 1 capsule (300 mg total) by mouth 2 (two) times daily., Disp: 14 capsule, Rfl: 0 .  scopolamine (TRANSDERM-SCOP, 1.5 MG,) 1 MG/3DAYS, Place 1 patch (1.5 mg total) onto the skin every 3 (three)  days., Disp: 10 patch, Rfl: 12  PAST MEDICAL HISTORY: Past Medical History:  Diagnosis Date  . Chondromalacia of left patella 10/2016  . Complication of anesthesia    slow to awaken after anesthesia for wisdom teeth extraction  . Family history of adverse reaction to anesthesia    pt's father has hx. of being hard to wake up post-op  . Lateral meniscus tear 10/2016   left  . Meningioma ([place])    right frontal  . Migraines   . Multiple sclerosis ([place])   . PONV (postoperative nausea and vomiting)   . PVC's (premature ventricular contractions)    no current med.    PAST SURGICAL HISTORY: Past Surgical History:  Procedure Laterality Date  . AUGMENTATION MAMMAPLASTY Bilateral    1994  . BREAST BIOPSY Right 08/14/2016  . BREAST ENHANCEMENT SURGERY    . CHONDROPLASTY Left 10/31/2016   Procedure: LEFT KNEE CHONDROPLASTY;  Surgeon: Renette Butters, MD;  Location: [facility];  Service: Orthopedics;  Laterality: Left;  . ESOPHAGOGASTRODUODENOSCOPY (EGD) WITH PROPOFOL N/A 07/05/2015   Procedure: ESOPHAGOGASTRODUODENOSCOPY (EGD) WITH PROPOFOL;  Surgeon: Arta Silence, MD;  Location: [facility];  Service: Endoscopy;  Laterality: N/A;  . KNEE ARTHROSCOPY WITH LATERAL MENISECTOMY Left 10/31/2016   Procedure: LEFT KNEE ARTHROSCOPY WITH LATERAL MENISCECTOMY;  Surgeon: Renette Butters, MD;  Location: [facility];  Service: Orthopedics;  Laterality: Left;  . KNEE HARDWARE REMOVAL Right 11/30/2009   with debridement of scar tissue  . ORIF PROXIMAL TIBIAL PLATEAU FRACTURE Right 06/01/2009  . TOTAL VAGINAL HYSTERECTOMY  05/22/2000  . WISDOM TOOTH EXTRACTION      FAMILY HISTORY: Family History  Problem Relation Age of Onset  . Heart disease Paternal Grandfather   . Stomach cancer Paternal Grandmother   . Colon cancer Paternal Grandmother   . Heart murmur Father   . Heart disease Father   . Heart failure Father   . Anesthesia problems Father        hard to wake  up post-op    SOCIAL HISTORY:  Social History   Socioeconomic History  . Marital status: Married    Spouse name: Not on file  . Number of children: 2  .  Years of education: Not on file  . Highest education level: Not on file  Social Needs  . Financial resource strain: Not on file  . Food insecurity - worry: Not on file  . Food insecurity - inability: Not on file  . Transportation needs - medical: Not on file  . Transportation needs - non-medical: Not on file  Occupational History  . Occupation: MRI Librarian, academic at [employer]: [employer]  Tobacco Use  . Smoking status: Never Smoker  . Smokeless tobacco: Never Used  Substance and Sexual Activity  . Alcohol use: No    Alcohol/week: 0.0 oz  . Drug use: No  . Sexual activity: Not on file  Other Topics Concern  . Not on file  Social History Narrative  . Not on file     PHYSICAL EXAM  Vitals:   04/10/17 0823  BP: 121/71  Pulse: 86  Resp: 14  Weight: 131 lb (59.4 kg)  Height: '5\' 7"'$  (1.702 m)    Body mass index is 20.52 kg/m.   General: The patient is well-developed and well-nourished and in no acute distress  Eyes:  Funduscopic exam shows normal optic discs and retinal vessels.  Neurologic Exam  Mental status: The patient is alert and oriented x 3 at the time of the examination. The patient has apparent normal recent and remote memory, with an apparently normal attention span and concentration ability.   Speech is normal.  Cranial nerves: Extraocular muscles are intact. Facial strength and sensation is normal. Trapezius strength is normal. No dysarthria is noted.  The tongue is midline, and the patient has symmetric elevation of the soft palate. No obvious hearing deficits are noted.  Motor:  Muscle bulk is normal.   Tone is normal. Strength is  5 / 5 in all 4 extremities.   Sensory: Sensation is intact to touch and vibration..  Coordination: Cerebellar testing reveals good finger-nose-finger  Gait and  station: Station is normal.   Gait is normal. Tandem gait is very minimally wide. Romberg is negative.   Reflexes: Deep tendon reflexes are brisk with spread at the knees.            DIAGNOSTIC DATA (LABS, IMAGING, TESTING) - I reviewed patient records, labs, notes, testing and imaging myself where available.  Lab Results  Component Value Date   WBC 8.7 03/28/2015   HGB 13.7 03/28/2015   HCT 41.5 03/28/2015   MCV 87.7 03/28/2015   PLT 389.0 03/28/2015      Component Value Date/Time   NA 140 03/28/2015 0950   K 4.0 03/28/2015 0950   CL 107 03/28/2015 0950   CO2 27 03/28/2015 0950   GLUCOSE 90 03/28/2015 0950   BUN 21 03/28/2015 0950   CREATININE 0.70 03/28/2015 0950   CALCIUM 9.3 03/28/2015 0950   PROT 7.3 03/28/2015 0950   ALBUMIN 4.0 03/28/2015 0950   AST 13 03/28/2015 0950   ALT 9 03/28/2015 0950   ALKPHOS 65 03/28/2015 0950   BILITOT 0.4 03/28/2015 0950   GFRNONAA 109.73 02/21/2010 1302   GFRAA  05/31/2009 1000    >60        The eGFR has been calculated using the MDRD equation. This calculation has not been validated in all clinical situations. eGFR's persistently <60 mL/min signify possible Chronic Kidney Disease.   Lab Results  Component Value Date   CHOL 126 03/28/2015   HDL 34.20 (L) 03/28/2015   LDLCALC 82 03/28/2015   TRIG 48.0 03/28/2015  CHOLHDL 4 03/28/2015   No results found for: HGBA1C No results found for: VITAMINB12 Lab Results  Component Value Date   TSH 1.20 03/28/2015       ASSESSMENT AND PLAN  Multiple sclerosis (HCC)  Vertigo  MULTIPLE SCLEROSIS  Meningioma (HCC)  Paresthesia  Other fatigue   1.   Due to her new symptoms (and also to evaluate the meningioma), we need to check an MRI of the brain with and without contrast.  If she has significant changes associated with her MS, she will reconsider going on a disease modifying therapy.  2. Continue Topamax 100 mg nightly and add imipramine 10-20 mg nightly. This should  help her headache and also help her sleep maintenance. Additionally a prescription for Provigil was provided for her shift work issues and daytime sleepiness    consider a stimulant if this is not effective.  She will return to see me in 6 month or call sooner if he has new or worsening neurologic symptoms.  Artemisia Auvil A. Felecia Shelling, MD, PhD 9/37/1696, 7:89 PM Certified in Neurology, Clinical Neurophysiology, Sleep Medicine, Pain Medicine and Neuroimaging  [facility] [address], [place], [place] [address] [phone]

## 2017-04-14 DIAGNOSIS — M25662 Stiffness of left knee, not elsewhere classified: Secondary | ICD-10-CM | POA: Diagnosis not present

## 2017-04-14 DIAGNOSIS — M6281 Muscle weakness (generalized): Secondary | ICD-10-CM | POA: Diagnosis not present

## 2017-04-14 DIAGNOSIS — M25562 Pain in left knee: Secondary | ICD-10-CM | POA: Diagnosis not present

## 2017-04-14 DIAGNOSIS — R262 Difficulty in walking, not elsewhere classified: Secondary | ICD-10-CM | POA: Diagnosis not present

## 2017-04-15 ENCOUNTER — Ambulatory Visit (HOSPITAL_COMMUNITY)
Admission: RE | Admit: 2017-04-15 | Discharge: 2017-04-15 | Disposition: A | Discharge status: Home / Self Care | Payer: 59 | Source: Ambulatory Visit | Attending: Neurology | Admitting: Neurology

## 2017-04-15 DIAGNOSIS — R51 Headache: Secondary | ICD-10-CM | POA: Insufficient documentation

## 2017-04-15 DIAGNOSIS — D320 Benign neoplasm of cerebral meninges: Secondary | ICD-10-CM | POA: Diagnosis not present

## 2017-04-15 DIAGNOSIS — J320 Chronic maxillary sinusitis: Secondary | ICD-10-CM | POA: Diagnosis not present

## 2017-04-15 DIAGNOSIS — G35 Multiple sclerosis: Secondary | ICD-10-CM | POA: Insufficient documentation

## 2017-04-15 DIAGNOSIS — R42 Dizziness and giddiness: Secondary | ICD-10-CM | POA: Diagnosis not present

## 2017-04-15 MED ORDER — GADOBENATE DIMEGLUMINE 529 MG/ML IV SOLN
15.0000 mL | Freq: Once | INTRAVENOUS | Status: AC | PRN
  Administered 2017-04-15: 12 mL via INTRAVENOUS

## 2017-04-16 ENCOUNTER — Other Ambulatory Visit: Payer: Self-pay | Admitting: Unknown Physician Specialty

## 2017-04-16 DIAGNOSIS — M25662 Stiffness of left knee, not elsewhere classified: Secondary | ICD-10-CM | POA: Diagnosis not present

## 2017-04-16 DIAGNOSIS — R262 Difficulty in walking, not elsewhere classified: Secondary | ICD-10-CM | POA: Diagnosis not present

## 2017-04-16 DIAGNOSIS — R42 Dizziness and giddiness: Secondary | ICD-10-CM

## 2017-04-16 DIAGNOSIS — M6281 Muscle weakness (generalized): Secondary | ICD-10-CM | POA: Diagnosis not present

## 2017-04-16 DIAGNOSIS — M25562 Pain in left knee: Secondary | ICD-10-CM | POA: Diagnosis not present

## 2017-04-17 ENCOUNTER — Telehealth: Payer: Self-pay | Admitting: *Deleted

## 2017-04-17 NOTE — Telephone Encounter (Signed)
Spoke with Horris Latino and reviewed below MRI reasults.  She verbalized understanding of same/fim

## 2017-04-17 NOTE — Telephone Encounter (Signed)
-----   Message from Britt Bottom, MD sent at 04/16/2017  5:48 PM EST ----- Please let her know that the MRI shows no new MS lesions. The meningioma is unchanged.  There is mild asymmetry of the seventh nerves but since she does not have any facial weakness and there does not appear to be any masses, this could be post viral inflammation.

## 2017-04-18 DIAGNOSIS — M6281 Muscle weakness (generalized): Secondary | ICD-10-CM | POA: Diagnosis not present

## 2017-04-18 DIAGNOSIS — R262 Difficulty in walking, not elsewhere classified: Secondary | ICD-10-CM | POA: Diagnosis not present

## 2017-04-18 DIAGNOSIS — M25662 Stiffness of left knee, not elsewhere classified: Secondary | ICD-10-CM | POA: Diagnosis not present

## 2017-04-18 DIAGNOSIS — M25562 Pain in left knee: Secondary | ICD-10-CM | POA: Diagnosis not present

## 2017-04-21 DIAGNOSIS — R262 Difficulty in walking, not elsewhere classified: Secondary | ICD-10-CM | POA: Diagnosis not present

## 2017-04-21 DIAGNOSIS — M25562 Pain in left knee: Secondary | ICD-10-CM | POA: Diagnosis not present

## 2017-04-21 DIAGNOSIS — M6281 Muscle weakness (generalized): Secondary | ICD-10-CM | POA: Diagnosis not present

## 2017-04-21 DIAGNOSIS — M25662 Stiffness of left knee, not elsewhere classified: Secondary | ICD-10-CM | POA: Diagnosis not present

## 2017-04-23 DIAGNOSIS — M25562 Pain in left knee: Secondary | ICD-10-CM | POA: Diagnosis not present

## 2017-04-23 DIAGNOSIS — M25662 Stiffness of left knee, not elsewhere classified: Secondary | ICD-10-CM | POA: Diagnosis not present

## 2017-04-23 DIAGNOSIS — R262 Difficulty in walking, not elsewhere classified: Secondary | ICD-10-CM | POA: Diagnosis not present

## 2017-04-23 DIAGNOSIS — M6281 Muscle weakness (generalized): Secondary | ICD-10-CM | POA: Diagnosis not present

## 2017-04-25 DIAGNOSIS — M6281 Muscle weakness (generalized): Secondary | ICD-10-CM | POA: Diagnosis not present

## 2017-04-25 DIAGNOSIS — R262 Difficulty in walking, not elsewhere classified: Secondary | ICD-10-CM | POA: Diagnosis not present

## 2017-04-25 DIAGNOSIS — M25662 Stiffness of left knee, not elsewhere classified: Secondary | ICD-10-CM | POA: Diagnosis not present

## 2017-04-25 DIAGNOSIS — M25562 Pain in left knee: Secondary | ICD-10-CM | POA: Diagnosis not present

## 2017-04-25 MED FILL — TRANSDERM-SCOP 1.5 MG/72HR: 1 | 21 days supply | Qty: 7 | Fill #0

## 2017-04-28 DIAGNOSIS — M6281 Muscle weakness (generalized): Secondary | ICD-10-CM | POA: Diagnosis not present

## 2017-04-28 DIAGNOSIS — M25662 Stiffness of left knee, not elsewhere classified: Secondary | ICD-10-CM | POA: Diagnosis not present

## 2017-04-28 DIAGNOSIS — M25562 Pain in left knee: Secondary | ICD-10-CM | POA: Diagnosis not present

## 2017-04-28 DIAGNOSIS — R262 Difficulty in walking, not elsewhere classified: Secondary | ICD-10-CM | POA: Diagnosis not present

## 2017-04-30 DIAGNOSIS — R262 Difficulty in walking, not elsewhere classified: Secondary | ICD-10-CM | POA: Diagnosis not present

## 2017-04-30 DIAGNOSIS — M6281 Muscle weakness (generalized): Secondary | ICD-10-CM | POA: Diagnosis not present

## 2017-04-30 DIAGNOSIS — M25662 Stiffness of left knee, not elsewhere classified: Secondary | ICD-10-CM | POA: Diagnosis not present

## 2017-04-30 DIAGNOSIS — M25562 Pain in left knee: Secondary | ICD-10-CM | POA: Diagnosis not present

## 2017-05-02 DIAGNOSIS — R42 Dizziness and giddiness: Secondary | ICD-10-CM | POA: Diagnosis not present

## 2017-05-04 MED FILL — IMIPRAMINE HCL 10 MG TABLET: 10 | 30 days supply | Qty: 60 | Fill #5

## 2017-05-04 MED FILL — TOPIRAMATE 50 MG TABLET: 50 | 30 days supply | Qty: 60 | Fill #6

## 2017-05-05 DIAGNOSIS — R42 Dizziness and giddiness: Secondary | ICD-10-CM | POA: Diagnosis not present

## 2017-05-05 DIAGNOSIS — M25562 Pain in left knee: Secondary | ICD-10-CM | POA: Diagnosis not present

## 2017-05-05 DIAGNOSIS — M25662 Stiffness of left knee, not elsewhere classified: Secondary | ICD-10-CM | POA: Diagnosis not present

## 2017-05-05 DIAGNOSIS — R262 Difficulty in walking, not elsewhere classified: Secondary | ICD-10-CM | POA: Diagnosis not present

## 2017-05-05 DIAGNOSIS — M6281 Muscle weakness (generalized): Secondary | ICD-10-CM | POA: Diagnosis not present

## 2017-05-07 DIAGNOSIS — M25662 Stiffness of left knee, not elsewhere classified: Secondary | ICD-10-CM | POA: Diagnosis not present

## 2017-05-07 DIAGNOSIS — M6281 Muscle weakness (generalized): Secondary | ICD-10-CM | POA: Diagnosis not present

## 2017-05-07 DIAGNOSIS — R262 Difficulty in walking, not elsewhere classified: Secondary | ICD-10-CM | POA: Diagnosis not present

## 2017-05-07 DIAGNOSIS — M25562 Pain in left knee: Secondary | ICD-10-CM | POA: Diagnosis not present

## 2017-05-09 DIAGNOSIS — M25662 Stiffness of left knee, not elsewhere classified: Secondary | ICD-10-CM | POA: Diagnosis not present

## 2017-05-09 DIAGNOSIS — M6281 Muscle weakness (generalized): Secondary | ICD-10-CM | POA: Diagnosis not present

## 2017-05-09 DIAGNOSIS — R262 Difficulty in walking, not elsewhere classified: Secondary | ICD-10-CM | POA: Diagnosis not present

## 2017-05-09 DIAGNOSIS — M25562 Pain in left knee: Secondary | ICD-10-CM | POA: Diagnosis not present

## 2017-05-12 DIAGNOSIS — M25562 Pain in left knee: Secondary | ICD-10-CM | POA: Diagnosis not present

## 2017-05-12 DIAGNOSIS — M25662 Stiffness of left knee, not elsewhere classified: Secondary | ICD-10-CM | POA: Diagnosis not present

## 2017-05-12 DIAGNOSIS — M6281 Muscle weakness (generalized): Secondary | ICD-10-CM | POA: Diagnosis not present

## 2017-05-12 DIAGNOSIS — R262 Difficulty in walking, not elsewhere classified: Secondary | ICD-10-CM | POA: Diagnosis not present

## 2017-05-14 DIAGNOSIS — R262 Difficulty in walking, not elsewhere classified: Secondary | ICD-10-CM | POA: Diagnosis not present

## 2017-05-14 DIAGNOSIS — M25562 Pain in left knee: Secondary | ICD-10-CM | POA: Diagnosis not present

## 2017-05-14 DIAGNOSIS — M25662 Stiffness of left knee, not elsewhere classified: Secondary | ICD-10-CM | POA: Diagnosis not present

## 2017-05-14 DIAGNOSIS — M6281 Muscle weakness (generalized): Secondary | ICD-10-CM | POA: Diagnosis not present

## 2017-05-21 DIAGNOSIS — R262 Difficulty in walking, not elsewhere classified: Secondary | ICD-10-CM | POA: Diagnosis not present

## 2017-05-21 DIAGNOSIS — M25662 Stiffness of left knee, not elsewhere classified: Secondary | ICD-10-CM | POA: Diagnosis not present

## 2017-05-21 DIAGNOSIS — M6281 Muscle weakness (generalized): Secondary | ICD-10-CM | POA: Diagnosis not present

## 2017-05-21 DIAGNOSIS — M25562 Pain in left knee: Secondary | ICD-10-CM | POA: Diagnosis not present

## 2017-05-28 DIAGNOSIS — R262 Difficulty in walking, not elsewhere classified: Secondary | ICD-10-CM | POA: Diagnosis not present

## 2017-05-28 DIAGNOSIS — M25662 Stiffness of left knee, not elsewhere classified: Secondary | ICD-10-CM | POA: Diagnosis not present

## 2017-05-28 DIAGNOSIS — S83272S Complex tear of lateral meniscus, current injury, left knee, sequela: Secondary | ICD-10-CM | POA: Diagnosis not present

## 2017-05-28 DIAGNOSIS — L905 Scar conditions and fibrosis of skin: Secondary | ICD-10-CM | POA: Diagnosis not present

## 2017-05-28 DIAGNOSIS — M6281 Muscle weakness (generalized): Secondary | ICD-10-CM | POA: Diagnosis not present

## 2017-05-28 DIAGNOSIS — Z9889 Other specified postprocedural states: Secondary | ICD-10-CM | POA: Diagnosis not present

## 2017-05-28 DIAGNOSIS — M25562 Pain in left knee: Secondary | ICD-10-CM | POA: Diagnosis not present

## 2017-05-28 DIAGNOSIS — M76892 Other specified enthesopathies of left lower limb, excluding foot: Secondary | ICD-10-CM | POA: Diagnosis not present

## 2017-05-30 DIAGNOSIS — M25562 Pain in left knee: Secondary | ICD-10-CM | POA: Diagnosis not present

## 2017-05-30 DIAGNOSIS — M25662 Stiffness of left knee, not elsewhere classified: Secondary | ICD-10-CM | POA: Diagnosis not present

## 2017-05-30 DIAGNOSIS — R262 Difficulty in walking, not elsewhere classified: Secondary | ICD-10-CM | POA: Diagnosis not present

## 2017-05-30 DIAGNOSIS — M6281 Muscle weakness (generalized): Secondary | ICD-10-CM | POA: Diagnosis not present

## 2017-06-02 DIAGNOSIS — M25662 Stiffness of left knee, not elsewhere classified: Secondary | ICD-10-CM | POA: Diagnosis not present

## 2017-06-02 DIAGNOSIS — M25562 Pain in left knee: Secondary | ICD-10-CM | POA: Diagnosis not present

## 2017-06-02 DIAGNOSIS — R262 Difficulty in walking, not elsewhere classified: Secondary | ICD-10-CM | POA: Diagnosis not present

## 2017-06-02 DIAGNOSIS — M6281 Muscle weakness (generalized): Secondary | ICD-10-CM | POA: Diagnosis not present

## 2017-06-04 DIAGNOSIS — M25562 Pain in left knee: Secondary | ICD-10-CM | POA: Diagnosis not present

## 2017-06-04 DIAGNOSIS — M25662 Stiffness of left knee, not elsewhere classified: Secondary | ICD-10-CM | POA: Diagnosis not present

## 2017-06-04 DIAGNOSIS — M6281 Muscle weakness (generalized): Secondary | ICD-10-CM | POA: Diagnosis not present

## 2017-06-04 DIAGNOSIS — R262 Difficulty in walking, not elsewhere classified: Secondary | ICD-10-CM | POA: Diagnosis not present

## 2017-06-06 MED FILL — IMIPRAMINE HCL 10 MG TABLET: 10 | 30 days supply | Qty: 60 | Fill #6

## 2017-06-06 MED FILL — TOPIRAMATE 50 MG TABLET: 50 | 30 days supply | Qty: 60 | Fill #7

## 2017-06-11 DIAGNOSIS — M6281 Muscle weakness (generalized): Secondary | ICD-10-CM | POA: Diagnosis not present

## 2017-06-11 DIAGNOSIS — M25662 Stiffness of left knee, not elsewhere classified: Secondary | ICD-10-CM | POA: Diagnosis not present

## 2017-06-11 DIAGNOSIS — R262 Difficulty in walking, not elsewhere classified: Secondary | ICD-10-CM | POA: Diagnosis not present

## 2017-06-11 DIAGNOSIS — M25562 Pain in left knee: Secondary | ICD-10-CM | POA: Diagnosis not present

## 2017-06-16 DIAGNOSIS — M25562 Pain in left knee: Secondary | ICD-10-CM | POA: Diagnosis not present

## 2017-06-16 DIAGNOSIS — M25662 Stiffness of left knee, not elsewhere classified: Secondary | ICD-10-CM | POA: Diagnosis not present

## 2017-06-16 DIAGNOSIS — R262 Difficulty in walking, not elsewhere classified: Secondary | ICD-10-CM | POA: Diagnosis not present

## 2017-06-16 DIAGNOSIS — M6281 Muscle weakness (generalized): Secondary | ICD-10-CM | POA: Diagnosis not present

## 2017-06-18 DIAGNOSIS — R262 Difficulty in walking, not elsewhere classified: Secondary | ICD-10-CM | POA: Diagnosis not present

## 2017-06-18 DIAGNOSIS — M6281 Muscle weakness (generalized): Secondary | ICD-10-CM | POA: Diagnosis not present

## 2017-06-18 DIAGNOSIS — M25562 Pain in left knee: Secondary | ICD-10-CM | POA: Diagnosis not present

## 2017-06-18 DIAGNOSIS — M25662 Stiffness of left knee, not elsewhere classified: Secondary | ICD-10-CM | POA: Diagnosis not present

## 2017-06-23 DIAGNOSIS — M25662 Stiffness of left knee, not elsewhere classified: Secondary | ICD-10-CM | POA: Diagnosis not present

## 2017-06-23 DIAGNOSIS — R262 Difficulty in walking, not elsewhere classified: Secondary | ICD-10-CM | POA: Diagnosis not present

## 2017-06-23 DIAGNOSIS — M6281 Muscle weakness (generalized): Secondary | ICD-10-CM | POA: Diagnosis not present

## 2017-06-23 DIAGNOSIS — M25562 Pain in left knee: Secondary | ICD-10-CM | POA: Diagnosis not present

## 2017-06-25 DIAGNOSIS — M25662 Stiffness of left knee, not elsewhere classified: Secondary | ICD-10-CM | POA: Diagnosis not present

## 2017-06-25 DIAGNOSIS — R262 Difficulty in walking, not elsewhere classified: Secondary | ICD-10-CM | POA: Diagnosis not present

## 2017-06-25 DIAGNOSIS — M25562 Pain in left knee: Secondary | ICD-10-CM | POA: Diagnosis not present

## 2017-06-25 DIAGNOSIS — M6281 Muscle weakness (generalized): Secondary | ICD-10-CM | POA: Diagnosis not present

## 2017-06-30 DIAGNOSIS — M6281 Muscle weakness (generalized): Secondary | ICD-10-CM | POA: Diagnosis not present

## 2017-06-30 DIAGNOSIS — M25562 Pain in left knee: Secondary | ICD-10-CM | POA: Diagnosis not present

## 2017-06-30 DIAGNOSIS — R262 Difficulty in walking, not elsewhere classified: Secondary | ICD-10-CM | POA: Diagnosis not present

## 2017-06-30 DIAGNOSIS — M25662 Stiffness of left knee, not elsewhere classified: Secondary | ICD-10-CM | POA: Diagnosis not present

## 2017-07-03 DIAGNOSIS — M25662 Stiffness of left knee, not elsewhere classified: Secondary | ICD-10-CM | POA: Diagnosis not present

## 2017-07-03 DIAGNOSIS — R262 Difficulty in walking, not elsewhere classified: Secondary | ICD-10-CM | POA: Diagnosis not present

## 2017-07-03 DIAGNOSIS — M25562 Pain in left knee: Secondary | ICD-10-CM | POA: Diagnosis not present

## 2017-07-03 DIAGNOSIS — M6281 Muscle weakness (generalized): Secondary | ICD-10-CM | POA: Diagnosis not present

## 2017-07-07 DIAGNOSIS — M25562 Pain in left knee: Secondary | ICD-10-CM | POA: Diagnosis not present

## 2017-07-07 DIAGNOSIS — S83272S Complex tear of lateral meniscus, current injury, left knee, sequela: Secondary | ICD-10-CM | POA: Diagnosis not present

## 2017-07-07 DIAGNOSIS — M25662 Stiffness of left knee, not elsewhere classified: Secondary | ICD-10-CM | POA: Diagnosis not present

## 2017-07-07 DIAGNOSIS — M6281 Muscle weakness (generalized): Secondary | ICD-10-CM | POA: Diagnosis not present

## 2017-07-09 DIAGNOSIS — L905 Scar conditions and fibrosis of skin: Secondary | ICD-10-CM | POA: Diagnosis not present

## 2017-07-15 MED FILL — TOPIRAMATE 50 MG TABLET: 50 | 30 days supply | Qty: 60 | Fill #8

## 2017-07-18 DIAGNOSIS — S83272S Complex tear of lateral meniscus, current injury, left knee, sequela: Secondary | ICD-10-CM | POA: Diagnosis not present

## 2017-07-18 DIAGNOSIS — M6281 Muscle weakness (generalized): Secondary | ICD-10-CM | POA: Diagnosis not present

## 2017-07-18 DIAGNOSIS — M25562 Pain in left knee: Secondary | ICD-10-CM | POA: Diagnosis not present

## 2017-07-18 DIAGNOSIS — M25662 Stiffness of left knee, not elsewhere classified: Secondary | ICD-10-CM | POA: Diagnosis not present

## 2017-07-22 MED FILL — IMIPRAMINE HCL 10 MG TABLET: 10 | 30 days supply | Qty: 60 | Fill #7

## 2017-07-31 DIAGNOSIS — R262 Difficulty in walking, not elsewhere classified: Secondary | ICD-10-CM | POA: Diagnosis not present

## 2017-07-31 DIAGNOSIS — M25562 Pain in left knee: Secondary | ICD-10-CM | POA: Diagnosis not present

## 2017-07-31 DIAGNOSIS — M25662 Stiffness of left knee, not elsewhere classified: Secondary | ICD-10-CM | POA: Diagnosis not present

## 2017-07-31 DIAGNOSIS — M6281 Muscle weakness (generalized): Secondary | ICD-10-CM | POA: Diagnosis not present

## 2017-08-15 MED FILL — TOPIRAMATE 50 MG TABLET: 50 | 30 days supply | Qty: 60 | Fill #9

## 2017-09-04 ENCOUNTER — Encounter: Payer: Self-pay | Admitting: Neurology

## 2017-09-04 ENCOUNTER — Ambulatory Visit: Payer: 59 | Admitting: Neurology

## 2017-09-04 ENCOUNTER — Telehealth: Payer: Self-pay | Admitting: Neurology

## 2017-09-04 ENCOUNTER — Other Ambulatory Visit: Payer: Self-pay

## 2017-09-04 VITALS — BP 103/64 | HR 66 | Resp 18 | Ht 67.0 in | Wt 130.0 lb

## 2017-09-04 DIAGNOSIS — R202 Paresthesia of skin: Secondary | ICD-10-CM

## 2017-09-04 DIAGNOSIS — G35 Multiple sclerosis: Secondary | ICD-10-CM | POA: Diagnosis not present

## 2017-09-04 DIAGNOSIS — R5383 Other fatigue: Secondary | ICD-10-CM | POA: Diagnosis not present

## 2017-09-04 DIAGNOSIS — R29818 Other symptoms and signs involving the nervous system: Secondary | ICD-10-CM

## 2017-09-04 MED ORDER — ONDANSETRON 8 MG PO TBDP: 8.0000 mg | ORAL_TABLET | Freq: Three times a day (TID) | ORAL | 0 refills | Status: DC | PRN

## 2017-09-04 MED ORDER — GABAPENTIN 300 MG PO CAPS: 300.0000 mg | ORAL_CAPSULE | Freq: Three times a day (TID) | ORAL | 11 refills | Status: DC

## 2017-09-04 MED ORDER — AMPHETAMINE-DEXTROAMPHET ER 15 MG PO CP24: 15.0000 mg | ORAL_CAPSULE | ORAL | 0 refills | Status: DC

## 2017-09-04 MED FILL — ONDANSETRON ODT 8 MG TABLET: 8 | 7 days supply | Qty: 20 | Fill #0

## 2017-09-04 MED FILL — ADDERALL XR 15 MG CAP SA: 15 | 30 days supply | Qty: 30 | Fill #0

## 2017-09-04 MED FILL — GABAPENTIN 300 MG CAPSULE: 300 | 30 days supply | Qty: 90 | Fill #0

## 2017-09-04 NOTE — Progress Notes (Signed)
[facility]  PATIENT: Ashley May DOB: 04/05/1970  REFERRING DOCTOR OR PCP:  Einar Pheasant SOURCE: patient, records in EMR and MRI images on PACS/CD  _________________________________   HISTORICAL  CHIEF COMPLAINT:  Chief Complaint  Patient presents with  . Multiple Sclerosis    Remains off of dmt. Stopped Provigil b/c she didn't feel it helped fatigue.  Would like to discuss ADDerall.  Believes Imipramine is helping bladder, but still getting up 1-2 times per night.  Would like to know if she can increase Imipramine.  Under more stress--husband recently dx. with aggressive prostate cancer. He had an experimental procedure which was effective but not covered by insurance, so there is some financial burden./fim    HISTORY OF PRESENT ILLNESS:  Ashley May is a 47 y.o. woman with multiple sclerosis.       Update 09/04/2017: She is noting episodes with a shock like sensation from her neck down lasting 10-20 seconds and associated with feeling weak.    Most of these spells occur a night when she changes positions in bed and only one occurred while sitting, but still with a change in position.   These started about 2-3 months ago and now occur 2-4 times every night.      She has been sleeping worse due to these symptoms.      She has been off of disease modifying therapy since about 2014 and had been on Betaseron for many years.  She was diagnosed around 47 after presenting with a cervical transverse myelitis and other evidence of MS.  She has had a Lhermitte sign off and on since diagnosis though the current symptoms seem to be different.   Her last cervical spine MRI was around 2010 and I personally reviewed it showing several plaques in the upper cervical spine.  She notes no change in gait, strength, sensation or bladder function.   Vision is fine.     Her mood is doing okay though she does have more stress with her husband recently being diagnosed with  cancer.  Update 04/10/2017: She had a sinus infection and URI concurrently.  She began to note vertigo and reduced balance.    With a scopolamine patch, she did better but vertigo returned.    She took 10-14 days of amoxacillin.    Then she had a UTI and was on a cephalosporin.    She then had knee surgery 2 days ago.    With all these issues, she has noted that the vertigo returns 1 day or two after stopping scopolamine. She does not note veering with walking and the vertigo is not clearly a spinning or translational feeling but more like motion sickness like in the back seat of a car.   Her ENT doctor did not feel that this was due to her ear/vestibular system but rather MS.     Her last exacerbation was at least 15 years ago.   Her last MRI was in 2016 and was stable.   She has not any disease modifying therapy.  She feels gait has ben stabvle otehrwise.   No new numbness, weakness or bladder change.   Vision is unchanged.      Fatigue has been doing ok, even with infections.    Sleep is good.    Mood has been stale.   Cognition is fine.      From 10/07/2016 MS:    She feels her MS has been stable. Her last exacerbation was more  than 15 years ago. She had several exacerbations in the first 5 years of her disease.   Her last MRI in 2016 did not show any new lesions.  Headache:   She reports a HA with pain in the occiput and pressure in the forehead and eyes.  She notes more stress at work with more hours.    It has been constant x 10 days.   They were doing better adter starting Topamax at the last visit.    Gait/strength/sensation: She feels that her gait is normal. She is able to walk long distances without difficulty. She can take steps with her eyes closed and not fall. The significant weakness in her arms or legs. She will rarely get the Lhermite sign and/or tingling in the chest.  Bladder: She denies any significant problems with her bladder. She has no urgency, frequency or hesitancy. She has  no nocturia.  Vision:   She denies any MS related vision problem.    She had optic neuritis shortly after diagnosis with complete recovery..  Fatigue/sleep: This is been changes in her work schedule with more hours, she is more tired. She also feels a little bit worse than she is in the heat. Sleep has been much worse the last month.   She is sleeping only 5-6 hours due to changes in her schedule and difficulty with sleep maintenance.     She'll have nocturia once some nights.  Mood/cognition: She denies any depression or anxiety. She notes mild cognitive issues.    As an example, she goes into a room and is not sure why she is there and has mild forgetfulness   She notes this seems worse associated with longer hours at work (55-60 hours).   Meningioma:   MRIs have shown a small meningioma. This has been stable to slightly increased over many years.  MS History:   She presented in 1994 with a Lhermite sign and tingling in her hands. An MRI of the cervical spine revealed a cervical plaque. She also had an MRI of the brain and a lumbar puncture at that time. The studies were consistent with multiple sclerosis.   She was placed on Betaseron but had a lot of difficulty tolerating the medication. Specifically she would get severe myalgias and fatigue. Therefore, she used the medication intermittently over the next 7 years or so. In 2000, she delivered a healthy daughter. A few months later, she became pregnant again but this pregnancy was complicated by adenomyosis and she required a hysterectomy. Afterwards, she had an exacerbation. Her symptoms at that time included a poor gait with a Romberg sign. She was placed on several days of IV steroids and improved. Around 2001, she stopped Betaseron and has not been on any therapy since. Most of this time, she was seeing Dr. Jacqulynn Cadet. She last saw him in 2014.   I first saw her May 2016.      Review of studies:   MRI of the brain dated 08/18/2015 and compared it to  the prior MRI. There are white matter lesions in a pattern and configuration consistent with multiple sclerosis. They are all located in the hemispheres and there are no brainstem or cerebellar lesions.   Additionally, there is a 1.2 cm right frontal meningioma that was unchanged.   MRI of the cervical spine12/31/2007 shows midline posterior plaque at C2 and left posterior plaque at C3.    She had another MRI of the brain 06/08/2012 compared to 2011 MRI shows a  similar pattern and configuration of white matter foci. In the interval, there has only been the development of only 1 new small plaque in the right cerebral hemisphere. As before, there were no posterior fossa lesions. That study was done without contrast.  The meningioma looks about the same size. Chronic left maxillary and ethmoid sinusitis is noted.  FH:  Her brother (one year older) has primary progressive MS and has right hemiplegia.       REVIEW OF SYSTEMS: Constitutional: No fevers, chills, sweats, or change in appetite Eyes: No visual changes, double vision, eye pain Ear, nose and throat: No hearing loss, ear pain, nasal congestion, sore throat Cardiovascular: No chest pain, palpitations Respiratory: No shortness of breath at rest or with exertion.   No wheezes GastrointestinaI: No nausea, vomiting, diarrhea, abdominal pain, fecal incontinence Genitourinary: No dysuria, urinary retention or frequency.  No nocturia. Musculoskeletal: No neck pain, back pain Integumentary: No rash, pruritus, skin lesions Neurological: as above Psychiatric: No depression at this time.  No anxiety Endocrine: No palpitations, diaphoresis, change in appetite, change in weigh or increased thirst Hematologic/Lymphatic: No anemia, purpura, petechiae. Allergic/Immunologic: No itchy/runny eyes, nasal congestion, recent allergic reactions, rashes  ALLERGIES: Allergies  Allergen Reactions  . Prochlorperazine Edisylate Anaphylaxis  . Eggs Or  Egg-Derived Products Nausea And Vomiting  . Gadolinium Derivatives Nausea And Vomiting  . Morphine Itching    HOME MEDICATIONS:  Current Outpatient Medications:  .  cholecalciferol (VITAMIN D) 1000 units tablet, Take 1,000 Units by mouth daily., Disp: , Rfl:  .  imipramine (TOFRANIL) 10 MG tablet, Take 2 tablets (20 mg total) by mouth at bedtime., Disp: 180 tablet, Rfl: 4 .  Multiple Vitamin (MULTIVITAMIN) tablet, Take 1 tablet by mouth daily., Disp: , Rfl:  .  topiramate (TOPAMAX) 100 MG tablet, Take 1 tablet (100 mg total) by mouth at bedtime., Disp: 90 tablet, Rfl: 4 .  amphetamine-dextroamphetamine (ADDERALL XR) 15 MG 24 hr capsule, Take 1 capsule by mouth every morning., Disp: 30 capsule, Rfl: 0 .  gabapentin (NEURONTIN) 300 MG capsule, Take 1 capsule (300 mg total) by mouth 3 (three) times daily., Disp: 90 capsule, Rfl: 11 .  ondansetron (ZOFRAN-ODT) 8 MG disintegrating tablet, Take 1 tablet (8 mg total) by mouth every 8 (eight) hours as needed for nausea or vomiting., Disp: 20 tablet, Rfl: 0  PAST MEDICAL HISTORY: Past Medical History:  Diagnosis Date  . Chondromalacia of left patella 10/2016  . Complication of anesthesia    slow to awaken after anesthesia for wisdom teeth extraction  . Family history of adverse reaction to anesthesia    pt's father has hx. of being hard to wake up post-op  . Lateral meniscus tear 10/2016   left  . Meningioma ([place])    right frontal  . Migraines   . Multiple sclerosis ([place])   . PONV (postoperative nausea and vomiting)   . PVC's (premature ventricular contractions)    no current med.    PAST SURGICAL HISTORY: Past Surgical History:  Procedure Laterality Date  . AUGMENTATION MAMMAPLASTY Bilateral    1994  . BREAST BIOPSY Right 08/14/2016  . BREAST ENHANCEMENT SURGERY    . CHONDROPLASTY Left 10/31/2016   Procedure: LEFT KNEE CHONDROPLASTY;  Surgeon: Renette Butters, MD;  Location: [facility];  Service: Orthopedics;   Laterality: Left;  . ESOPHAGOGASTRODUODENOSCOPY (EGD) WITH PROPOFOL N/A 07/05/2015   Procedure: ESOPHAGOGASTRODUODENOSCOPY (EGD) WITH PROPOFOL;  Surgeon: Arta Silence, MD;  Location: [facility];  Service: Endoscopy;  Laterality: N/A;  .  KNEE ARTHROSCOPY WITH LATERAL MENISECTOMY Left 10/31/2016   Procedure: LEFT KNEE ARTHROSCOPY WITH LATERAL MENISCECTOMY;  Surgeon: Renette Butters, MD;  Location: [facility];  Service: Orthopedics;  Laterality: Left;  . KNEE HARDWARE REMOVAL Right 11/30/2009   with debridement of scar tissue  . ORIF PROXIMAL TIBIAL PLATEAU FRACTURE Right 06/01/2009  . TOTAL VAGINAL HYSTERECTOMY  05/22/2000  . WISDOM TOOTH EXTRACTION      FAMILY HISTORY: Family History  Problem Relation Age of Onset  . Heart disease Paternal Grandfather   . Stomach cancer Paternal Grandmother   . Colon cancer Paternal Grandmother   . Heart murmur Father   . Heart disease Father   . Heart failure Father   . Anesthesia problems Father        hard to wake up post-op    SOCIAL HISTORY:  Social History   Socioeconomic History  . Marital status: Married    Spouse name: Not on file  . Number of children: 2  . Years of education: Not on file  . Highest education level: Not on file  Occupational History  . Occupation: MRI Librarian, academic at [employer]: [place]  . Financial resource strain: Not on file  . Food insecurity:    Worry: Not on file    Inability: Not on file  . Transportation needs:    Medical: Not on file    Non-medical: Not on file  Tobacco Use  . Smoking status: Never Smoker  . Smokeless tobacco: Never Used  Substance and Sexual Activity  . Alcohol use: No    Alcohol/week: 0.0 oz  . Drug use: No  . Sexual activity: Not on file  Lifestyle  . Physical activity:    Days per week: Not on file    Minutes per session: Not on file  . Stress: Not on file  Relationships  . Social connections:    Talks on phone: Not on file     Gets together: Not on file    Attends religious service: Not on file    Active member of club or organization: Not on file    Attends meetings of clubs or organizations: Not on file    Relationship status: Not on file  . Intimate partner violence:    Fear of current or ex partner: Not on file    Emotionally abused: Not on file    Physically abused: Not on file    Forced sexual activity: Not on file  Other Topics Concern  . Not on file  Social History Narrative  . Not on file     PHYSICAL EXAM  Vitals:   09/04/17 0918  BP: 103/64  Pulse: 66  Resp: 18  Weight: 130 lb (59 kg)  Height: '5\' 7"'$  (1.702 m)    Body mass index is 20.36 kg/m.   General: The patient is well-developed and well-nourished and in no acute distress  Neurologic Exam  Mental status: The patient is alert and oriented x 3 at the time of the examination. The patient has apparent normal recent and remote memory, with an apparently normal attention span and concentration ability.   Speech is normal.  Cranial nerves: Extraocular muscles are intact. Facial strength and sensation is normal. Trapezius strength is normal. No dysarthria is noted.  The tongue is midline, and the patient has symmetric elevation of the soft palate. No obvious hearing deficits are noted.  Motor:  Muscle bulk is normal.  Muscle tone is  normal.  Strength is 5/5 in the arms and legs.  Sensory: Sensation is intact to touch and vibration..  Coordination: Cerebellar testing shows good finger-nose-finger and heel-to-shin.  Gait and station: Station is normal.   The gait is normal and the tandem gait is mildly wide.  Romberg is negative.  Reflexes: Deep tendon reflexes are normal in the arms but brisk in the legs with spread at the knees.Marland Kitchen            DIAGNOSTIC DATA (LABS, IMAGING, TESTING) - I reviewed patient records, labs, notes, testing and imaging myself where available.  Lab Results  Component Value Date   WBC 8.7 03/28/2015    HGB 13.7 03/28/2015   HCT 41.5 03/28/2015   MCV 87.7 03/28/2015   PLT 389.0 03/28/2015      Component Value Date/Time   NA 140 03/28/2015 0950   K 4.0 03/28/2015 0950   CL 107 03/28/2015 0950   CO2 27 03/28/2015 0950   GLUCOSE 90 03/28/2015 0950   BUN 21 03/28/2015 0950   CREATININE 0.70 03/28/2015 0950   CALCIUM 9.3 03/28/2015 0950   PROT 7.3 03/28/2015 0950   ALBUMIN 4.0 03/28/2015 0950   AST 13 03/28/2015 0950   ALT 9 03/28/2015 0950   ALKPHOS 65 03/28/2015 0950   BILITOT 0.4 03/28/2015 0950   GFRNONAA 109.73 02/21/2010 1302   GFRAA  05/31/2009 1000    >60        The eGFR has been calculated using the MDRD equation. This calculation has not been validated in all clinical situations. eGFR's persistently <60 mL/min signify possible Chronic Kidney Disease.   Lab Results  Component Value Date   CHOL 126 03/28/2015   HDL 34.20 (L) 03/28/2015   LDLCALC 82 03/28/2015   TRIG 48.0 03/28/2015   CHOLHDL 4 03/28/2015   No results found for: HGBA1C No results found for: VITAMINB12 Lab Results  Component Value Date   TSH 1.20 03/28/2015       ASSESSMENT AND PLAN  Multiple sclerosis ([place]) - Plan: MR CERVICAL SPINE W WO CONTRAST  Paresthesia  Other fatigue  Lhermitte's sign positive   1.   She is having new symptoms referable to the cervical spine.  We will check an MRI to determine if she has new demyelinating plaques or a compressive myelopathy.  If she does have new plaques I will again recommend that she resume disease modifying therapy.  If there is a compressive etiology, refer to [facility]. 2. Continue meds for migraine.   Change Provigil to a stimulant.   3.   Gabapentin 4 dysesthesias. 4.  She will return to see me in 6 month or call sooner if he has new or worsening neurologic symptoms.  40 minutes face-to-face evaluation with greater than one half the time counseling or coordinating care about her new symptoms.  Ladawn Boullion A. Felecia Shelling, MD, PhD 7/71/1657, 9:03  PM Certified in Neurology, Clinical Neurophysiology, Sleep Medicine, Pain Medicine and Neuroimaging  [facility] [address], [place], [place] [address] [phone]

## 2017-09-04 NOTE — Telephone Encounter (Signed)
pt wants [facility] and will call to schedule herself,.

## 2017-09-05 ENCOUNTER — Ambulatory Visit (HOSPITAL_COMMUNITY)
Admission: RE | Admit: 2017-09-05 | Discharge: 2017-09-05 | Disposition: A | Discharge status: Home / Self Care | Payer: 59 | Source: Ambulatory Visit | Attending: Neurology | Admitting: Neurology

## 2017-09-05 ENCOUNTER — Telehealth: Payer: Self-pay | Admitting: Neurology

## 2017-09-05 DIAGNOSIS — G35 Multiple sclerosis: Secondary | ICD-10-CM | POA: Diagnosis not present

## 2017-09-05 DIAGNOSIS — M5030 Other cervical disc degeneration, unspecified cervical region: Secondary | ICD-10-CM | POA: Insufficient documentation

## 2017-09-05 DIAGNOSIS — M4802 Spinal stenosis, cervical region: Secondary | ICD-10-CM | POA: Diagnosis not present

## 2017-09-05 DIAGNOSIS — L905 Scar conditions and fibrosis of skin: Secondary | ICD-10-CM | POA: Diagnosis not present

## 2017-09-05 MED ORDER — GADOBENATE DIMEGLUMINE 529 MG/ML IV SOLN
15.0000 mL | Freq: Once | INTRAVENOUS | Status: AC | PRN
  Administered 2017-09-05: 12 mL via INTRAVENOUS

## 2017-09-05 NOTE — Telephone Encounter (Signed)
I was unable to leave a voicemail as mailbox was full.  The MRI of the cervical spine shows no new lesions from the MS and nothing pressing on the spinal cord.  It looks unchanged compared to her previous MRI.

## 2017-09-08 ENCOUNTER — Telehealth: Payer: Self-pay | Admitting: Neurology

## 2017-09-08 MED ORDER — GABAPENTIN 100 MG PO CAPS: 100.0000 mg | ORAL_CAPSULE | Freq: Three times a day (TID) | ORAL | 1 refills | Status: DC

## 2017-09-08 NOTE — Telephone Encounter (Signed)
Pt has returned the call to Dr Felecia Shelling, she is asking for a call back when he becomes available

## 2017-09-08 NOTE — Telephone Encounter (Signed)
Spoke with Ashley May to let her know  that I did not think that she had any new lesions in the MRI and the degenerative changes are mild (no spinal stenosis or compressive myelopathy).  She is having some trouble tolerating the gabapentin and I will call when the smaller 100 mg dose to have her more gradually bring herself up to 300 mg 3 times a day.   I would like her to start at 100-100-300 and over a week or so increase the dose so that she will get to 300-300-300.mg

## 2017-09-09 MED FILL — GABAPENTIN 100 MG CAPSULE: 100 | 30 days supply | Qty: 90 | Fill #0

## 2017-10-07 ENCOUNTER — Other Ambulatory Visit: Payer: Self-pay | Admitting: Neurology

## 2017-10-07 MED ORDER — AMPHETAMINE-DEXTROAMPHET ER 15 MG PO CP24: 15.0000 mg | ORAL_CAPSULE | ORAL | 0 refills | Status: DC

## 2017-10-07 MED ORDER — OXCARBAZEPINE 300 MG PO TABS
ORAL_TABLET | ORAL | 3 refills | Status: DC
Start: 2017-10-07 — End: 2017-11-03

## 2017-10-07 MED FILL — OXcarbazepine 300 MG TABS: 300 | 30 days supply | Qty: 90 | Fill #0

## 2017-10-07 MED FILL — ADDERALL XR 15 MG CAP SA: 15 | 30 days supply | Qty: 30 | Fill #0

## 2017-10-07 NOTE — Telephone Encounter (Signed)
Spoke with Horris Latino.  She requested Adderall r/f to go to [facility]. Pharm. Sts. Gabapentin 300mg  tid not helping dysesthesias. Still having occasional episode of legs collapsing at night if she gets up. Brief episodes, only last 10-15 seconds, then she returns to baseline.  Last MRI showed no new lesions in c-spine. Will check with RAS and call her back/fim

## 2017-10-07 NOTE — Telephone Encounter (Signed)
Per RAS, ok to stop Gabapentin since it has not helped, and start Trileptal 300mg  qam and 600mg  qhs. Pt. agreeable with this plan. Rx. escribed to [facility]. Pharmacy/fim

## 2017-10-07 NOTE — Addendum Note (Signed)
Addended by: France Ravens I on: 10/07/2017 10:47 AM   Modules accepted: Orders

## 2017-10-07 NOTE — Telephone Encounter (Signed)
Patient presented to the lobby requesting Adderall refill and to state that the gabapentin is not working. She made a follow up apt in August. Best call back [phone]

## 2017-10-08 ENCOUNTER — Ambulatory Visit: Payer: 59 | Admitting: Neurology

## 2017-10-16 ENCOUNTER — Ambulatory Visit: Payer: 59

## 2017-11-03 ENCOUNTER — Encounter: Payer: Self-pay | Admitting: Neurology

## 2017-11-03 ENCOUNTER — Ambulatory Visit: Payer: 59 | Admitting: Neurology

## 2017-11-03 ENCOUNTER — Other Ambulatory Visit: Payer: Self-pay

## 2017-11-03 VITALS — BP 126/74 | HR 74 | Resp 14 | Ht 67.0 in | Wt 131.0 lb

## 2017-11-03 DIAGNOSIS — R29818 Other symptoms and signs involving the nervous system: Secondary | ICD-10-CM

## 2017-11-03 DIAGNOSIS — R5383 Other fatigue: Secondary | ICD-10-CM | POA: Diagnosis not present

## 2017-11-03 DIAGNOSIS — G35 Multiple sclerosis: Secondary | ICD-10-CM | POA: Diagnosis not present

## 2017-11-03 DIAGNOSIS — R202 Paresthesia of skin: Secondary | ICD-10-CM | POA: Diagnosis not present

## 2017-11-03 MED ORDER — OXCARBAZEPINE 300 MG PO TABS: ORAL_TABLET | ORAL | 11 refills | Status: DC

## 2017-11-03 MED ORDER — AMPHETAMINE-DEXTROAMPHET ER 25 MG PO CP24: 25.0000 mg | ORAL_CAPSULE | ORAL | 0 refills | Status: DC

## 2017-11-03 MED FILL — OXcarbazepine 300 MG TABS: 300 | 30 days supply | Qty: 90 | Fill #0

## 2017-11-03 MED FILL — ADDERALL XR 25 MG CAPSULE: 25 | 30 days supply | Qty: 30 | Fill #0

## 2017-11-03 NOTE — Telephone Encounter (Signed)
Duplicate/fim 

## 2017-11-03 NOTE — Progress Notes (Signed)
[facility]  PATIENT: Ashley May DOB: [birth year]  REFERRING DOCTOR OR PCP:  Einar Pheasant SOURCE: patient, records in EMR and MRI images on PACS/CD  _________________________________   HISTORICAL  CHIEF COMPLAINT:  Chief Complaint  Patient presents with  . Multiple Sclerosis    Sts. dysesthesias are much better on Trileptal but it makes her sleepy--she would like to discuss increasing Adderall/fim    HISTORY OF PRESENT ILLNESS:  Ashley May is a 48 y.o. woman with multiple sclerosis.   Update 11/03/2017: Her dysesthesias are doing better with Trileptal 300 mg po qAM and 600 mg po qPM.   The dysesthesias were neck down and sometimes triggered by getting out of bed.   Bending over also caused some spells.    However, since she started the Tecfidera she is feeling much more tired during the days.  She does note that the Adderall is helping some.  Her current dose is 15 mg time release in the morning.   We discussed changes in dose  She denies any change in her gait or balance.  Strength is normal.  Although she has a dysesthesias, she denies numbness.   Bladder function is doing well.  She denies any changes in fatigue or mood.  Migraines are doing very well on Topamax and imipramine now only having 1-2 a month.    When one occurs Excedrin Migraine knocks it out.    Update 09/04/2017: She is noting episodes with a shock like sensation from her neck down lasting 10-20 seconds and associated with feeling weak.    Most of these spells occur a night when she changes positions in bed and only one occurred while sitting, but still with a change in position.   These started about 2-3 months ago and now occur 2-4 times every night.      She has been sleeping worse due to these symptoms.      She has been off of disease modifying therapy since about 2014 and had been on Betaseron for many years.  She was diagnosed around 57 after presenting with a cervical  transverse myelitis and other evidence of MS.  She has had a Lhermitte sign off and on since diagnosis though the current symptoms seem to be different.   Her last cervical spine MRI was around 2010 and I personally reviewed it showing several plaques in the upper cervical spine.  She notes no change in gait, strength, sensation or bladder function.   Vision is fine.     Her mood is doing okay though she does have more stress with her husband recently being diagnosed with cancer.  Update 04/10/2017: She had a sinus infection and URI concurrently.  She began to note vertigo and reduced balance.    With a scopolamine patch, she did better but vertigo returned.    She took 10-14 days of amoxacillin.    Then she had a UTI and was on a cephalosporin.    She then had knee surgery 2 days ago.    With all these issues, she has noted that the vertigo returns 1 day or two after stopping scopolamine. She does not note veering with walking and the vertigo is not clearly a spinning or translational feeling but more like motion sickness like in the back seat of a car.   Her ENT doctor did not feel that this was due to her ear/vestibular system but rather MS.     Her last exacerbation was at least  15 years ago.   Her last MRI was in 2016 and was stable.   She has not any disease modifying therapy.  She feels gait has ben stabvle otehrwise.   No new numbness, weakness or bladder change.   Vision is unchanged.      Fatigue has been doing ok, even with infections.    Sleep is good.    Mood has been stale.   Cognition is fine.      From 10/07/2016 MS:    She feels her MS has been stable. Her last exacerbation was more than 15 years ago. She had several exacerbations in the first 5 years of her disease.   Her last MRI in 2016 did not show any new lesions.  Headache:   She reports a HA with pain in the occiput and pressure in the forehead and eyes.  She notes more stress at work with more hours.    It has been constant x  10 days.   They were doing better adter starting Topamax at the last visit.    Gait/strength/sensation: She feels that her gait is normal. She is able to walk long distances without difficulty. She can take steps with her eyes closed and not fall. The significant weakness in her arms or legs. She will rarely get the Lhermite sign and/or tingling in the chest.  Bladder: She denies any significant problems with her bladder. She has no urgency, frequency or hesitancy. She has no nocturia.  Vision:   She denies any MS related vision problem.    She had optic neuritis shortly after diagnosis with complete recovery..  Fatigue/sleep: This is been changes in her work schedule with more hours, she is more tired. She also feels a little bit worse than she is in the heat. Sleep has been much worse the last month.   She is sleeping only 5-6 hours due to changes in her schedule and difficulty with sleep maintenance.     She'll have nocturia once some nights.  Mood/cognition: She denies any depression or anxiety. She notes mild cognitive issues.    As an example, she goes into a room and is not sure why she is there and has mild forgetfulness   She notes this seems worse associated with longer hours at work (55-60 hours).   Meningioma:   MRIs have shown a small meningioma. This has been stable to slightly increased over many years.  MS History:   She presented in 1994 with a Lhermite sign and tingling in her hands. An MRI of the cervical spine revealed a cervical plaque. She also had an MRI of the brain and a lumbar puncture at that time. The studies were consistent with multiple sclerosis.   She was placed on Betaseron but had a lot of difficulty tolerating the medication. Specifically she would get severe myalgias and fatigue. Therefore, she used the medication intermittently over the next 7 years or so. In 2000, she delivered a healthy daughter. A few months later, she became pregnant again but this pregnancy was  complicated by adenomyosis and she required a hysterectomy. Afterwards, she had an exacerbation. Her symptoms at that time included a poor gait with a Romberg sign. She was placed on several days of IV steroids and improved. Around 2001, she stopped Betaseron and has not been on any therapy since. Most of this time, she was seeing Dr. Jacqulynn Cadet. She last saw him in 2014.   I first saw her May 2016.  Review of studies:   MRI of the brain dated 08/18/2015 and compared it to the prior MRI. There are white matter lesions in a pattern and configuration consistent with multiple sclerosis. They are all located in the hemispheres and there are no brainstem or cerebellar lesions.   Additionally, there is a 1.2 cm right frontal meningioma that was unchanged.   MRI of the cervical spine12/31/2007 shows midline posterior plaque at C2 and left posterior plaque at C3.    She had another MRI of the brain 06/08/2012 compared to 2011 MRI shows a similar pattern and configuration of white matter foci. In the interval, there has only been the development of only 1 new small plaque in the right cerebral hemisphere. As before, there were no posterior fossa lesions. That study was done without contrast.  The meningioma looks about the same size. Chronic left maxillary and ethmoid sinusitis is noted.  FH:  Her brother (one year older) has primary progressive MS and has right hemiplegia.       REVIEW OF SYSTEMS: Constitutional: No fevers, chills, sweats, or change in appetite.   She notes some fatigue and sleepiness. Eyes: No visual changes, double vision, eye pain Ear, nose and throat: No hearing loss, ear pain, nasal congestion, sore throat Cardiovascular: No chest pain, palpitations Respiratory: No shortness of breath at rest or with exertion.   No wheezes GastrointestinaI: No nausea, vomiting, diarrhea, abdominal pain, fecal incontinence Genitourinary: No dysuria, urinary retention or frequency.  No  nocturia. Musculoskeletal: No neck pain, back pain Integumentary: No rash, pruritus, skin lesions Neurological: as above Psychiatric: No depression at this time.  No anxiety Endocrine: No palpitations, diaphoresis, change in appetite, change in weigh or increased thirst Hematologic/Lymphatic: No anemia, purpura, petechiae. Allergic/Immunologic: No itchy/runny eyes, nasal congestion, recent allergic reactions, rashes  ALLERGIES: Allergies  Allergen Reactions  . Prochlorperazine Edisylate Anaphylaxis  . Eggs Or Egg-Derived Products Nausea And Vomiting  . Gadolinium Derivatives Nausea And Vomiting  . Morphine Itching    HOME MEDICATIONS:  Current Outpatient Medications:  .  amphetamine-dextroamphetamine (ADDERALL XR) 25 MG 24 hr capsule, Take 1 capsule by mouth every morning., Disp: 30 capsule, Rfl: 0 .  cholecalciferol (VITAMIN D) 1000 units tablet, Take 1,000 Units by mouth daily., Disp: , Rfl:  .  imipramine (TOFRANIL) 10 MG tablet, Take 2 tablets (20 mg total) by mouth at bedtime., Disp: 180 tablet, Rfl: 4 .  Multiple Vitamin (MULTIVITAMIN) tablet, Take 1 tablet by mouth daily., Disp: , Rfl:  .  ondansetron (ZOFRAN-ODT) 8 MG disintegrating tablet, Take 1 tablet (8 mg total) by mouth every 8 (eight) hours as needed for nausea or vomiting., Disp: 20 tablet, Rfl: 0 .  Oxcarbazepine (TRILEPTAL) 300 MG tablet, Take 1 tablet in the morning and 2 tablets at bedtime, Disp: 90 tablet, Rfl: 11 .  topiramate (TOPAMAX) 100 MG tablet, Take 1 tablet (100 mg total) by mouth at bedtime., Disp: 90 tablet, Rfl: 4  PAST MEDICAL HISTORY: Past Medical History:  Diagnosis Date  . Chondromalacia of left patella 10/2016  . Complication of anesthesia    slow to awaken after anesthesia for wisdom teeth extraction  . Family history of adverse reaction to anesthesia    pt's father has hx. of being hard to wake up post-op  . Lateral meniscus tear 10/2016   left  . Meningioma ([place])    right frontal  .  Migraines   . Multiple sclerosis ([place])   . PONV (postoperative nausea and vomiting)   .  PVC's (premature ventricular contractions)    no current med.    PAST SURGICAL HISTORY: Past Surgical History:  Procedure Laterality Date  . AUGMENTATION MAMMAPLASTY Bilateral    1994  . BREAST BIOPSY Right 08/14/2016  . BREAST ENHANCEMENT SURGERY    . CHONDROPLASTY Left 10/31/2016   Procedure: LEFT KNEE CHONDROPLASTY;  Surgeon: Renette Butters, MD;  Location: [facility];  Service: Orthopedics;  Laterality: Left;  . ESOPHAGOGASTRODUODENOSCOPY (EGD) WITH PROPOFOL N/A 07/05/2015   Procedure: ESOPHAGOGASTRODUODENOSCOPY (EGD) WITH PROPOFOL;  Surgeon: Arta Silence, MD;  Location: [facility];  Service: Endoscopy;  Laterality: N/A;  . KNEE ARTHROSCOPY WITH LATERAL MENISECTOMY Left 10/31/2016   Procedure: LEFT KNEE ARTHROSCOPY WITH LATERAL MENISCECTOMY;  Surgeon: Renette Butters, MD;  Location: [facility];  Service: Orthopedics;  Laterality: Left;  . KNEE HARDWARE REMOVAL Right 11/30/2009   with debridement of scar tissue  . ORIF PROXIMAL TIBIAL PLATEAU FRACTURE Right 06/01/2009  . TOTAL VAGINAL HYSTERECTOMY  05/22/2000  . WISDOM TOOTH EXTRACTION      FAMILY HISTORY: Family History  Problem Relation Age of Onset  . Heart disease Paternal Grandfather   . Stomach cancer Paternal Grandmother   . Colon cancer Paternal Grandmother   . Heart murmur Father   . Heart disease Father   . Heart failure Father   . Anesthesia problems Father        hard to wake up post-op    SOCIAL HISTORY:  Social History   Socioeconomic History  . Marital status: Married    Spouse name: Not on file  . Number of children: 2  . Years of education: Not on file  . Highest education level: Not on file  Occupational History  . Occupation: MRI Librarian, academic at [employer]: [place]  . Financial resource strain: Not on file  . Food insecurity:    Worry: Not on file     Inability: Not on file  . Transportation needs:    Medical: Not on file    Non-medical: Not on file  Tobacco Use  . Smoking status: Never Smoker  . Smokeless tobacco: Never Used  Substance and Sexual Activity  . Alcohol use: No    Alcohol/week: 0.0 standard drinks  . Drug use: No  . Sexual activity: Not on file  Lifestyle  . Physical activity:    Days per week: Not on file    Minutes per session: Not on file  . Stress: Not on file  Relationships  . Social connections:    Talks on phone: Not on file    Gets together: Not on file    Attends religious service: Not on file    Active member of club or organization: Not on file    Attends meetings of clubs or organizations: Not on file    Relationship status: Not on file  . Intimate partner violence:    Fear of current or ex partner: Not on file    Emotionally abused: Not on file    Physically abused: Not on file    Forced sexual activity: Not on file  Other Topics Concern  . Not on file  Social History Narrative  . Not on file     PHYSICAL EXAM  Vitals:   11/03/17 0925  BP: 126/74  Pulse: 74  Resp: 14  Weight: 131 lb (59.4 kg)  Height: '5\' 7"'$  (1.702 m)    Body mass index is 20.52 kg/m.   General:  The patient is well-developed and well-nourished and in no acute distress  Neurologic Exam  Mental status: The patient is alert and oriented x 3 at the time of the examination. The patient has apparent normal recent and remote memory, with an apparently normal attention span and concentration ability.   Speech is normal.  Cranial nerves: Extraocular muscles are intact.  Facial strength and sensation is normal.  Trapezius strength is normal.. No obvious hearing deficits are noted.  Motor:  Muscle bulk is normal.  Muscle tone is normal.  Strength is 5/5 in the arms and legs.  Sensory: She has intact sensation to touch and vibration. Coordination: Cerebellar testing shows good finger-nose-finger and heel-to-shin.  Gait  and station: Station is normal.  Her gait is normal.  Tandem gait is near normal.  Romberg is negative.  Reflexes: Deep tendon reflexes are normal in the arms but brisk in the legs with spread at the knees.Marland Kitchen            DIAGNOSTIC DATA (LABS, IMAGING, TESTING) - I reviewed patient records, labs, notes, testing and imaging myself where available.  Lab Results  Component Value Date   WBC 8.7 03/28/2015   HGB 13.7 03/28/2015   HCT 41.5 03/28/2015   MCV 87.7 03/28/2015   PLT 389.0 03/28/2015      Component Value Date/Time   NA 140 03/28/2015 0950   K 4.0 03/28/2015 0950   CL 107 03/28/2015 0950   CO2 27 03/28/2015 0950   GLUCOSE 90 03/28/2015 0950   BUN 21 03/28/2015 0950   CREATININE 0.70 03/28/2015 0950   CALCIUM 9.3 03/28/2015 0950   PROT 7.3 03/28/2015 0950   ALBUMIN 4.0 03/28/2015 0950   AST 13 03/28/2015 0950   ALT 9 03/28/2015 0950   ALKPHOS 65 03/28/2015 0950   BILITOT 0.4 03/28/2015 0950   GFRNONAA 109.73 02/21/2010 1302   GFRAA  05/31/2009 1000    >60        The eGFR has been calculated using the MDRD equation. This calculation has not been validated in all clinical situations. eGFR's persistently <60 mL/min signify possible Chronic Kidney Disease.   Lab Results  Component Value Date   CHOL 126 03/28/2015   HDL 34.20 (L) 03/28/2015   LDLCALC 82 03/28/2015   TRIG 48.0 03/28/2015   CHOLHDL 4 03/28/2015   No results found for: HGBA1C No results found for: VITAMINB12 Lab Results  Component Value Date   TSH 1.20 03/28/2015       ASSESSMENT AND PLAN  MULTIPLE SCLEROSIS  Lhermitte's sign positive  Other fatigue  Paresthesia   1.   Her dysesthesias are doing better on Trileptal.  However, she is more tired and sleepy.  I will increase her Adderall from 15 mg to 25 mg.  If that does not help, then she should take only 1 Trileptal at night instead of 2.  2.  Continue medications for migraine. 3.  Vitamin D 5000 units daily.  Continue to exercise  and eat well 4.  She will return to see me in 6 month or call sooner if he has new or worsening neurologic symptoms.   Otie Headlee A. Felecia Shelling, MD, PhD 0/17/4944, 9:67 AM Certified in Neurology, Clinical Neurophysiology, Sleep Medicine, Pain Medicine and Neuroimaging  [facility] [address], [place], [place] [address] [phone]

## 2017-12-01 MED FILL — OXcarbazepine 300 MG TABS: 300 | 30 days supply | Qty: 90 | Fill #1

## 2017-12-02 ENCOUNTER — Other Ambulatory Visit: Payer: Self-pay | Admitting: Neurology

## 2017-12-03 MED FILL — ADDERALL XR 25 MG CAPSULE: 25 | 30 days supply | Qty: 30 | Fill #0

## 2017-12-19 DIAGNOSIS — L905 Scar conditions and fibrosis of skin: Secondary | ICD-10-CM | POA: Diagnosis not present

## 2017-12-31 ENCOUNTER — Other Ambulatory Visit: Payer: Self-pay | Admitting: Neurology

## 2017-12-31 MED FILL — TOPIRAMATE 50 MG TABLET: 50 | 30 days supply | Qty: 60 | Fill #0

## 2017-12-31 MED FILL — IMIPRAMINE HCL 10 MG TABLET: 10 | 30 days supply | Qty: 60 | Fill #0

## 2018-01-02 ENCOUNTER — Other Ambulatory Visit: Payer: Self-pay | Admitting: Neurology

## 2018-01-02 MED ORDER — AMPHETAMINE-DEXTROAMPHET ER 25 MG PO CP24
ORAL_CAPSULE | ORAL | 0 refills | Status: DC
Start: 2018-01-02 — End: 2018-02-03

## 2018-01-02 MED FILL — ADDERALL XR 25 MG CAPSULE: 25 | 30 days supply | Qty: 30 | Fill #0

## 2018-01-02 NOTE — Telephone Encounter (Signed)
I attempted to contact Ashley May but vm was full.  Dr. Felecia Shelling will send her Adderall to [facility]. Pharm. today. It looks like the refill request was sent to Dr. Jaynee Eagles while Dr. Felecia Shelling was out of the office.  The Adderall was not actually due until today, so Dr. Jaynee Eagles had not yet filled it/fim

## 2018-01-02 NOTE — Telephone Encounter (Signed)
Pt states she has been 2 days without her ADDERALL XR 25 MG 24 hr capsule Pt states she checked with  [facility], [place] [phone] (Phone) [phone] (Fax)   Around 7:30 this morning and they still do not have it.  Pt states she is very much in need of this medication.  Please call

## 2018-01-02 NOTE — Addendum Note (Signed)
Addended by: Arlice Colt A on: 01/02/2018 12:37 PM   Modules accepted: Orders

## 2018-01-02 NOTE — Addendum Note (Signed)
Addended by: France Ravens I on: 01/02/2018 09:40 AM   Modules accepted: Orders

## 2018-01-12 ENCOUNTER — Encounter: Payer: Self-pay | Admitting: *Deleted

## 2018-01-12 ENCOUNTER — Telehealth: Payer: Self-pay | Admitting: Neurology

## 2018-01-12 NOTE — Telephone Encounter (Signed)
Per Dr. Felecia Shelling okay to write letter authorizing this, will prepare letter

## 2018-01-12 NOTE — Telephone Encounter (Signed)
I called pt. She is going to [facility] for injections. I printed letter, waiting on MD signature. Pt will pick up letter.

## 2018-01-12 NOTE — Telephone Encounter (Signed)
Pt states she just left her dermatologist and they are asking for a letter from Dr Felecia Shelling re: him giving approval of them giving pt Botox for her migraines.  Pt is asking for a call to discuss

## 2018-02-03 ENCOUNTER — Other Ambulatory Visit: Payer: Self-pay | Admitting: Neurology

## 2018-02-03 MED FILL — ADDERALL XR 25 MG CAPSULE: 25 | 30 days supply | Qty: 30 | Fill #0

## 2018-02-09 ENCOUNTER — Other Ambulatory Visit: Payer: Self-pay

## 2018-02-09 ENCOUNTER — Ambulatory Visit
Admission: EM | Admit: 2018-02-09 | Discharge: 2018-02-09 | Disposition: A | Discharge status: Home / Self Care | Payer: 59 | Attending: Family Medicine | Admitting: Family Medicine

## 2018-02-09 ENCOUNTER — Ambulatory Visit (INDEPENDENT_AMBULATORY_CARE_PROVIDER_SITE_OTHER): Payer: 59

## 2018-02-09 DIAGNOSIS — W2209XA Striking against other stationary object, initial encounter: Secondary | ICD-10-CM | POA: Diagnosis not present

## 2018-02-09 DIAGNOSIS — S92502A Displaced unspecified fracture of left lesser toe(s), initial encounter for closed fracture: Secondary | ICD-10-CM

## 2018-02-09 DIAGNOSIS — S92532A Displaced fracture of distal phalanx of left lesser toe(s), initial encounter for closed fracture: Secondary | ICD-10-CM | POA: Diagnosis not present

## 2018-02-09 NOTE — ED Triage Notes (Signed)
Patient states that around 7 weeks ago she kicked a statue on accident and has continued to have 4th left toe pain. Patient states that toe is still red and painful.

## 2018-02-09 NOTE — ED Provider Notes (Signed)
[facility] ____________________________________________  Time seen: Approximately 9:16 AM  I have reviewed the triage vital signs and the nursing notes.   HISTORY  Chief Complaint Toe Pain   HPI Ashley May is a 47 y.o. female present for evaluation of left fourth toe pain and tenderness after injury that occurred 7 weeks ago.  Patient reports she was helping her dog and accidentally hit her toe on a small statue injuring it.  States initially had a very small amount of bleeding from a small cut, but reports that has healed well.  States continues with some tenderness as well as swelling with slight redness to that area prompting evaluation.  No previous evaluation of the same. Reports tetanus immunization is up-to-date.  States the mild redness has been constant since without any worsening.  Denies any drainage.  Denies any pain radiation or paresthesias.  Denies other injury.  Reports otherwise doing well.  Did initially apply buddy taping for first few weeks which helped.  Denies other aggravating alleviating factors.  Denies other complaints.  Einar Pheasant, MD: PCP   Past Medical History:  Diagnosis Date  . Chondromalacia of left patella 10/2016  . Complication of anesthesia    slow to awaken after anesthesia for wisdom teeth extraction  . Family history of adverse reaction to anesthesia    pt's father has hx. of being hard to wake up post-op  . Lateral meniscus tear 10/2016   left  . Meningioma ([place])    right frontal  . Migraines   . Multiple sclerosis ([place])   . PONV (postoperative nausea and vomiting)   . PVC's (premature ventricular contractions)    no current med.    Patient Active Problem List   Diagnosis Date Noted  . Lhermitte's sign positive 09/04/2017  . Vertigo 03/28/2017  . Sinusitis 03/16/2017  . Insomnia 10/07/2016  . Other fatigue 10/07/2016  . Health care maintenance 02/03/2015  . Paresthesia 08/04/2014  . Common migraine without  intractability 08/04/2014  . Sinusitis, chronic 08/04/2014  . PVCs (premature ventricular contractions) 01/26/2014  . Chronic cough 11/05/2010  . HYPOKALEMIA 02/21/2010  . Meningioma ([place]) 02/20/2010  . MULTIPLE SCLEROSIS 02/20/2010  . PALPITATIONS 02/20/2010  . SUPRAVENTRICULAR TACHYCARDIA, HX OF 02/20/2010    Past Surgical History:  Procedure Laterality Date  . AUGMENTATION MAMMAPLASTY Bilateral    1994  . BREAST BIOPSY Right 08/14/2016  . BREAST ENHANCEMENT SURGERY    . CHONDROPLASTY Left 10/31/2016   Procedure: LEFT KNEE CHONDROPLASTY;  Surgeon: Renette Butters, MD;  Location: [facility];  Service: Orthopedics;  Laterality: Left;  . ESOPHAGOGASTRODUODENOSCOPY (EGD) WITH PROPOFOL N/A 07/05/2015   Procedure: ESOPHAGOGASTRODUODENOSCOPY (EGD) WITH PROPOFOL;  Surgeon: Arta Silence, MD;  Location: [facility];  Service: Endoscopy;  Laterality: N/A;  . KNEE ARTHROSCOPY WITH LATERAL MENISECTOMY Left 10/31/2016   Procedure: LEFT KNEE ARTHROSCOPY WITH LATERAL MENISCECTOMY;  Surgeon: Renette Butters, MD;  Location: [place];  Service: Orthopedics;  Laterality: Left;  . KNEE HARDWARE REMOVAL Right 11/30/2009   with debridement of scar tissue  . ORIF PROXIMAL TIBIAL PLATEAU FRACTURE Right 06/01/2009  . TOTAL VAGINAL HYSTERECTOMY  05/22/2000  . WISDOM TOOTH EXTRACTION       No current facility-administered medications for this encounter.   Current Outpatient Medications:  .  amphetamine-dextroamphetamine (ADDERALL XR) 25 MG 24 hr capsule, TAKE 1 CAPSULE BY MOUTH EVERY MORNING, Disp: 30 capsule, Rfl: 0 .  cholecalciferol (VITAMIN D) 1000 units tablet, Take 1,000 Units by mouth  daily., Disp: , Rfl:  .  imipramine (TOFRANIL) 10 MG tablet, TAKE 1 TO 2 TABLETS BY MOUTH AT BEDTIME, Disp: 60 tablet, Rfl: 11 .  Multiple Vitamin (MULTIVITAMIN) tablet, Take 1 tablet by mouth daily., Disp: , Rfl:  .  ondansetron (ZOFRAN-ODT) 8 MG disintegrating tablet, Take 1 tablet  (8 mg total) by mouth every 8 (eight) hours as needed for nausea or vomiting., Disp: 20 tablet, Rfl: 0 .  Oxcarbazepine (TRILEPTAL) 300 MG tablet, Take 1 tablet in the morning and 2 tablets at bedtime, Disp: 90 tablet, Rfl: 11 .  topiramate (TOPAMAX) 50 MG tablet, TAKE 1 TO 2 TABLETS BY MOUTH AT BEDTIME, Disp: 60 tablet, Rfl: 11  Allergies Prochlorperazine edisylate; Eggs or egg-derived products; Gadolinium derivatives; and Morphine  Family History  Problem Relation Age of Onset  . Heart disease Paternal Grandfather   . Stomach cancer Paternal Grandmother   . Colon cancer Paternal Grandmother   . Heart murmur Father   . Heart disease Father   . Heart failure Father   . Anesthesia problems Father        hard to wake up post-op    Social History Social History   Tobacco Use  . Smoking status: Never Smoker  . Smokeless tobacco: Never Used  Substance Use Topics  . Alcohol use: No    Alcohol/week: 0.0 standard drinks  . Drug use: No    Review of Systems Constitutional: No fever Cardiovascular: Denies chest pain. Respiratory: Denies shortness of breath. Musculoskeletal: as above.  Skin:as above.   ____________________________________________   PHYSICAL EXAM:  VITAL SIGNS: ED Triage Vitals  Enc Vitals Group     BP 02/09/18 0849 123/83     Pulse Rate 02/09/18 0849 87     Resp 02/09/18 0849 18     Temp 02/09/18 0849 98.5 F (36.9 C)     Temp Source 02/09/18 0849 Oral     SpO2 02/09/18 0849 100 %     Weight 02/09/18 0848 134 lb (60.8 kg)     Height 02/09/18 0848 5\' 8"  (1.727 m)     Head Circumference --      Peak Flow --      Pain Score 02/09/18 0847 3     Pain Loc --      Pain Edu? --      Excl. in [place]? --     Constitutional: Alert and oriented. Well appearing and in no acute distress. ENT      Head: Normocephalic and atraumatic. Cardiovascular: Normal rate, regular rhythm. Grossly normal heart sounds.  Good peripheral circulation. Respiratory: Normal  respiratory effort without tachypnea nor retractions. Breath sounds are clear and equal bilaterally. No wheezes, rales, rhonchi. Musculoskeletal: Steady gait. Bilateral pedal pulses equal and easily palpated. Except: Left fourth toe distal phalanx laterally mild area of localized swelling and slight erythema, no further surrounding erythema, no drainage, skin intact, mild tenderness, left foot otherwise nontender. Neurologic:  Normal speech and language.Speech is normal. Skin:  Skin is warm, dry and intact. No rash noted. Psychiatric: Mood and affect are normal. Speech and behavior are normal. Patient exhibits appropriate insight and judgment   ___________________________________________   LABS (all labs ordered are listed, but only abnormal results are displayed)  Labs Reviewed - No data to display  RADIOLOGY  Dg Toe 4th Left  Result Date: 02/09/2018 CLINICAL DATA:  Injury several weeks prior with persistent pain EXAM: LEFT FOURTH TOE: 3 V COMPARISON:  None. FINDINGS: Frontal, oblique, and lateral views were  obtained. There is a fracture through the proximal portion of the fourth distal phalanx with fracture extending into the fourth DIP joint. Fracture is near anatomic in alignment. No other fracture. No dislocation. Joint spaces appear normal. No erosive change. IMPRESSION: Fracture involving the proximal aspect of the fourth distal phalanx with overall alignment near anatomic. Fracture extends into the fourth DIP joint. No other fracture. No dislocation. No evident arthropathy. These results will be called to the ordering clinician or representative by the Radiologist Assistant, and communication documented in the PACS or zVision Dashboard. Electronically Signed   By: Lowella Grip III M.D.   On: 02/09/2018 09:33   ____________________________________________   PROCEDURES Procedures    INITIAL IMPRESSION / ASSESSMENT AND PLAN / ED COURSE  Pertinent labs & imaging results that  were available during my care of the patient were reviewed by me and considered in my medical decision making (see chart for details).  No acute distress.  Left fourth toe mechanical injury that occurred 7 weeks ago.  Patient has continued with very mild pain, concern of if underlying fracture.  Doubt cellulitis by appearance.  Left fourth toe x-ray as above per radiologist, fracture involving proximal aspect of fourth distal phalanx.  Encourage supportive care, buddy taping as needed and follow-up as needed.  Discussed follow up and return parameters including no resolution or any worsening concerns. Patient verbalized understanding and agreed to plan.   ____________________________________________   FINAL CLINICAL IMPRESSION(S) / ED DIAGNOSES  Final diagnoses:  Closed fracture of phalanx of left fourth toe, initial encounter     ED Discharge Orders    None       Note: This dictation was prepared with Dragon dictation along with smaller phrase technology. Any transcriptional errors that result from this process are unintentional.         Marylene Land, NP 02/09/18 1450

## 2018-02-09 NOTE — Discharge Instructions (Addendum)
Supportive care. Buddy tape.   Follow up with your primary care physician this week as needed. Return to Urgent care for new or worsening concerns.

## 2018-02-12 MED FILL — OXcarbazepine 300 MG TABS: 300 | 30 days supply | Qty: 90 | Fill #2

## 2018-03-04 ENCOUNTER — Other Ambulatory Visit: Payer: Self-pay | Admitting: Neurology

## 2018-03-04 NOTE — Telephone Encounter (Signed)
[place] Database Verified  LR: 02-03-18 Qty: 30 Pending appt: 05-07-2018

## 2018-03-05 MED FILL — ADDERALL XR 25 MG CAPSULE: 25 | 30 days supply | Qty: 30 | Fill #0

## 2018-03-27 DIAGNOSIS — M76892 Other specified enthesopathies of left lower limb, excluding foot: Secondary | ICD-10-CM | POA: Diagnosis not present

## 2018-03-27 DIAGNOSIS — L905 Scar conditions and fibrosis of skin: Secondary | ICD-10-CM | POA: Diagnosis not present

## 2018-03-27 DIAGNOSIS — M65862 Other synovitis and tenosynovitis, left lower leg: Secondary | ICD-10-CM | POA: Diagnosis not present

## 2018-03-27 DIAGNOSIS — M2392 Unspecified internal derangement of left knee: Secondary | ICD-10-CM | POA: Diagnosis not present

## 2018-04-06 ENCOUNTER — Other Ambulatory Visit: Payer: Self-pay | Admitting: Neurology

## 2018-04-07 MED FILL — ADDERALL XR 25 MG CAPSULE: 25 | 30 days supply | Qty: 30 | Fill #0

## 2018-05-06 MED FILL — OXcarbazepine 300 MG TABS: 300 | 30 days supply | Qty: 90 | Fill #3

## 2018-05-07 ENCOUNTER — Other Ambulatory Visit: Payer: Self-pay | Admitting: Neurology

## 2018-05-07 ENCOUNTER — Ambulatory Visit: Payer: 59 | Admitting: Neurology

## 2018-05-07 ENCOUNTER — Telehealth: Payer: Self-pay | Admitting: Neurology

## 2018-05-07 MED FILL — TOPIRAMATE 50 MG TABLET: 50 | 30 days supply | Qty: 60 | Fill #1

## 2018-05-07 NOTE — Telephone Encounter (Signed)
Pt will need to pick up amphetamine-dextroamphetamine (ADDERALL XR) 25 MG 24 hr capsule at pharmacy tomorrow so she will have it over the weekend.  FYi

## 2018-05-07 NOTE — Telephone Encounter (Signed)
Noted, rx is pending approval with Dr. Felecia Shelling

## 2018-05-08 MED FILL — ADDERALL XR 25 MG CAPSULE: 25 | 30 days supply | Qty: 30 | Fill #0

## 2018-06-09 ENCOUNTER — Other Ambulatory Visit: Payer: Self-pay | Admitting: Neurology

## 2018-06-09 MED FILL — ADDERALL XR 25 MG CAPSULE: 25 | 30 days supply | Qty: 30 | Fill #0

## 2018-07-08 ENCOUNTER — Telehealth: Payer: Self-pay | Admitting: *Deleted

## 2018-07-08 NOTE — Telephone Encounter (Signed)
Called pt. Updated medication list, pharmacy and allergies on file for her virtual visit with Dr. Felecia Shelling 07/09/18.

## 2018-07-09 ENCOUNTER — Other Ambulatory Visit: Payer: Self-pay

## 2018-07-09 ENCOUNTER — Encounter: Payer: Self-pay | Admitting: Neurology

## 2018-07-09 ENCOUNTER — Ambulatory Visit (INDEPENDENT_AMBULATORY_CARE_PROVIDER_SITE_OTHER): Payer: 59 | Admitting: Neurology

## 2018-07-09 DIAGNOSIS — R5383 Other fatigue: Secondary | ICD-10-CM

## 2018-07-09 DIAGNOSIS — G35 Multiple sclerosis: Secondary | ICD-10-CM | POA: Diagnosis not present

## 2018-07-09 DIAGNOSIS — R202 Paresthesia of skin: Secondary | ICD-10-CM | POA: Diagnosis not present

## 2018-07-09 DIAGNOSIS — R29818 Other symptoms and signs involving the nervous system: Secondary | ICD-10-CM

## 2018-07-09 MED ORDER — AMPHETAMINE-DEXTROAMPHET ER 25 MG PO CP24: ORAL_CAPSULE | ORAL | 0 refills | Status: DC

## 2018-07-09 MED ORDER — AMPHETAMINE-DEXTROAMPHETAMINE 10 MG PO TABS: ORAL_TABLET | ORAL | 0 refills | Status: DC

## 2018-07-09 MED ORDER — ONDANSETRON 8 MG PO TBDP: 8.0000 mg | ORAL_TABLET | Freq: Three times a day (TID) | ORAL | 5 refills | Status: DC | PRN

## 2018-07-09 MED FILL — ONDANSETRON ODT 8 MG TABLET: 8 | 7 days supply | Qty: 20 | Fill #0

## 2018-07-09 MED FILL — ADDERALL XR 25 MG CAPSULE: 25 | 30 days supply | Qty: 30 | Fill #0

## 2018-07-09 MED FILL — AMPHETAMINE-DEXTROAMPHETAMI: 10 | 30 days supply | Qty: 30 | Fill #0

## 2018-07-09 NOTE — Progress Notes (Signed)
[facility]  PATIENT: Ashley May DOB: 1970/10/16  REFERRING DOCTOR OR PCP:  Einar Pheasant SOURCE: patient, records in EMR and MRI images on PACS/CD  _________________________________   HISTORICAL  CHIEF COMPLAINT:  Chief Complaint  Patient presents with  . Multiple Sclerosis    off DMTs since 2014  . Other    fatigue    HISTORY OF PRESENT ILLNESS:  Ashley May is a 47 y.o. woman with multiple sclerosis.   Update 07/09/2018: Virtual Visit via Video Note I connected with Ashley May on 07/09/18 at  3:00 PM EDT by a video enabled telemedicine application and verified that I am speaking with the correct person.  I discussed the limitations of evaluation and management by telemedicine and the availability of in person appointments. The patient expressed understanding and agreed to proceed.   As she did not have access to video.  The visit was converted to a telephone visit.   History of Present Illness: She feels her MS is stable and she has no exacerbations or new symptoms.    She has not been on a DMT x 6 years and has done well.   Gait, balance, strength and sensation are doing well.   Dysethesias improved on Oxcabazepine and she was able to stop a month ago and they did not return.  She has more fatigue, especially in the afternoons,   She is on Adderall XR 25 mg and takes at 530 am.  She is sleeping only 5-6 hours of sleep on work days.  Mood and cognition are doing well.  Migraines are doing well.  She notes more if sleep deprived but has done well since being on topiramate and imipramine 10    Observations/Objective: Alert and oriented x 3, fluent speech  Assessment and Plan: MULTIPLE SCLEROSIS  Other fatigue  Lhermitte's sign positive  Paresthesia  1.   Continue off DMTs for MS 2..  Add 10 mg Adderall IR as needed in the afternoon to the XR morning dose  Follow Up Instructions: I discussed the assessment and treatment plan  with the patient. The patient was provided an opportunity to ask questions and all were answered. The patient agreed with the plan and demonstrated an understanding of the instructions.    The patient was advised to call back or seek an in-person evaluation if the symptoms worsen or if the condition fails to improve as anticipated.  I provided 15 minutes of non-face-to-face time during this encounter.  ______________________ From previous visits Update 11/03/2017: Her dysesthesias are doing better with Trileptal 300 mg po qAM and 600 mg po qPM.   The dysesthesias were neck down and sometimes triggered by getting out of bed.   Bending over also caused some spells.    However, since she started the Tecfidera she is feeling much more tired during the days.  She does note that the Adderall is helping some.  Her current dose is 15 mg time release in the morning.   We discussed changes in dose  She denies any change in her gait or balance.  Strength is normal.  Although she has a dysesthesias, she denies numbness.   Bladder function is doing well.  She denies any changes in fatigue or mood.  Migraines are doing very well on Topamax and imipramine now only having 1-2 a month.    When one occurs Excedrin Migraine knocks it out.    Update 09/04/2017: She is noting episodes with a shock like sensation  from her neck down lasting 10-20 seconds and associated with feeling weak.    Most of these spells occur a night when she changes positions in bed and only one occurred while sitting, but still with a change in position.   These started about 2-3 months ago and now occur 2-4 times every night.      She has been sleeping worse due to these symptoms.      She has been off of disease modifying therapy since about 2014 and had been on Betaseron for many years.  She was diagnosed around 21 after presenting with a cervical transverse myelitis and other evidence of MS.  She has had a Lhermitte sign off and on since  diagnosis though the current symptoms seem to be different.   Her last cervical spine MRI was around 2010 and I personally reviewed it showing several plaques in the upper cervical spine.  She notes no change in gait, strength, sensation or bladder function.   Vision is fine.     Her mood is doing okay though she does have more stress with her husband recently being diagnosed with cancer.  Update 04/10/2017: She had a sinus infection and URI concurrently.  She began to note vertigo and reduced balance.    With a scopolamine patch, she did better but vertigo returned.    She took 10-14 days of amoxacillin.    Then she had a UTI and was on a cephalosporin.    She then had knee surgery 2 days ago.    With all these issues, she has noted that the vertigo returns 1 day or two after stopping scopolamine. She does not note veering with walking and the vertigo is not clearly a spinning or translational feeling but more like motion sickness like in the back seat of a car.   Her ENT doctor did not feel that this was due to her ear/vestibular system but rather MS.     Her last exacerbation was at least 15 years ago.   Her last MRI was in 2016 and was stable.   She has not any disease modifying therapy.  She feels gait has ben stabvle otehrwise.   No new numbness, weakness or bladder change.   Vision is unchanged.      Fatigue has been doing ok, even with infections.    Sleep is good.    Mood has been stale.   Cognition is fine.      From 10/07/2016 MS:    She feels her MS has been stable. Her last exacerbation was more than 15 years ago. She had several exacerbations in the first 5 years of her disease.   Her last MRI in 2016 did not show any new lesions.  Headache:   She reports a HA with pain in the occiput and pressure in the forehead and eyes.  She notes more stress at work with more hours.    It has been constant x 10 days.   They were doing better adter starting Topamax at the last visit.     Gait/strength/sensation: She feels that her gait is normal. She is able to walk long distances without difficulty. She can take steps with her eyes closed and not fall. The significant weakness in her arms or legs. She will rarely get the Lhermite sign and/or tingling in the chest.  Bladder: She denies any significant problems with her bladder. She has no urgency, frequency or hesitancy. She has no nocturia.  Vision:  She denies any MS related vision problem.    She had optic neuritis shortly after diagnosis with complete recovery..  Fatigue/sleep: This is been changes in her work schedule with more hours, she is more tired. She also feels a little bit worse than she is in the heat. Sleep has been much worse the last month.   She is sleeping only 5-6 hours due to changes in her schedule and difficulty with sleep maintenance.     She'll have nocturia once some nights.  Mood/cognition: She denies any depression or anxiety. She notes mild cognitive issues.    As an example, she goes into a room and is not sure why she is there and has mild forgetfulness   She notes this seems worse associated with longer hours at work (55-60 hours).   Meningioma:   MRIs have shown a small meningioma. This has been stable to slightly increased over many years.  MS History:   She presented in 1994 with a Lhermite sign and tingling in her hands. An MRI of the cervical spine revealed a cervical plaque. She also had an MRI of the brain and a lumbar puncture at that time. The studies were consistent with multiple sclerosis.   She was placed on Betaseron but had a lot of difficulty tolerating the medication. Specifically she would get severe myalgias and fatigue. Therefore, she used the medication intermittently over the next 7 years or so. In 2000, she delivered a healthy daughter. A few months later, she became pregnant again but this pregnancy was complicated by adenomyosis and she required a hysterectomy. Afterwards, she  had an exacerbation. Her symptoms at that time included a poor gait with a Romberg sign. She was placed on several days of IV steroids and improved. Around 2001, she stopped Betaseron and has not been on any therapy since. Most of this time, she was seeing Dr. Jacqulynn Cadet. She last saw him in 2014.   I first saw her May 2016.      Review of studies:   MRI of the brain dated 08/18/2015 and compared it to the prior MRI. There are white matter lesions in a pattern and configuration consistent with multiple sclerosis. They are all located in the hemispheres and there are no brainstem or cerebellar lesions.   Additionally, there is a 1.2 cm right frontal meningioma that was unchanged.   MRI of the cervical spine12/31/2007 shows midline posterior plaque at C2 and left posterior plaque at C3.    She had another MRI of the brain 06/08/2012 compared to 2011 MRI shows a similar pattern and configuration of white matter foci. In the interval, there has only been the development of only 1 new small plaque in the right cerebral hemisphere. As before, there were no posterior fossa lesions. That study was done without contrast.  The meningioma looks about the same size. Chronic left maxillary and ethmoid sinusitis is noted.  FH:  Her brother (one year older) has primary progressive MS and has right hemiplegia.       REVIEW OF SYSTEMS: Constitutional: No fevers, chills, sweats, or change in appetite.   She notes some fatigue and sleepiness. Eyes: No visual changes, double vision, eye pain Ear, nose and throat: No hearing loss, ear pain, nasal congestion, sore throat Cardiovascular: No chest pain, palpitations Respiratory: No shortness of breath at rest or with exertion.   No wheezes GastrointestinaI: No nausea, vomiting, diarrhea, abdominal pain, fecal incontinence Genitourinary: No dysuria, urinary retention or frequency.  No nocturia.  Musculoskeletal: No neck pain, back pain Integumentary: No rash, pruritus, skin  lesions Neurological: as above Psychiatric: No depression at this time.  No anxiety Endocrine: No palpitations, diaphoresis, change in appetite, change in weigh or increased thirst Hematologic/Lymphatic: No anemia, purpura, petechiae. Allergic/Immunologic: No itchy/runny eyes, nasal congestion, recent allergic reactions, rashes  ALLERGIES: Allergies  Allergen Reactions  . Prochlorperazine Edisylate Anaphylaxis  . Eggs Or Egg-Derived Products Nausea And Vomiting  . Gadolinium Derivatives Nausea And Vomiting  . Morphine Itching    HOME MEDICATIONS:  Current Outpatient Medications:  .  amphetamine-dextroamphetamine (ADDERALL XR) 25 MG 24 hr capsule, TAKE 1 CAPSULE BY MOUTH EVERY MORNING, Disp: 30 capsule, Rfl: 0 .  amphetamine-dextroamphetamine (ADDERALL) 10 MG tablet, Take one in the afternoon as needed, Disp: 30 tablet, Rfl: 0 .  cholecalciferol (VITAMIN D) 1000 units tablet, Take 8,000 Units by mouth daily. , Disp: , Rfl:  .  imipramine (TOFRANIL) 10 MG tablet, TAKE 1 TO 2 TABLETS BY MOUTH AT BEDTIME, Disp: 60 tablet, Rfl: 11 .  Multiple Vitamin (MULTIVITAMIN) tablet, Take 1 tablet by mouth daily., Disp: , Rfl:  .  ondansetron (ZOFRAN-ODT) 8 MG disintegrating tablet, Take 1 tablet (8 mg total) by mouth every 8 (eight) hours as needed for nausea or vomiting., Disp: 20 tablet, Rfl: 5 .  Oxcarbazepine (TRILEPTAL) 300 MG tablet, Take 1 tablet in the morning and 2 tablets at bedtime (Patient not taking: Reported on 07/08/2018), Disp: 90 tablet, Rfl: 11 .  topiramate (TOPAMAX) 50 MG tablet, TAKE 1 TO 2 TABLETS BY MOUTH AT BEDTIME, Disp: 60 tablet, Rfl: 11  PAST MEDICAL HISTORY: Past Medical History:  Diagnosis Date  . Chondromalacia of left patella 10/2016  . Complication of anesthesia    slow to awaken after anesthesia for wisdom teeth extraction  . Family history of adverse reaction to anesthesia    pt's father has hx. of being hard to wake up post-op  . Lateral meniscus tear 10/2016    left  . Meningioma ([place])    right frontal  . Migraines   . Multiple sclerosis ([place])   . PONV (postoperative nausea and vomiting)   . PVC's (premature ventricular contractions)    no current med.    PAST SURGICAL HISTORY: Past Surgical History:  Procedure Laterality Date  . AUGMENTATION MAMMAPLASTY Bilateral    1994  . BREAST BIOPSY Right 08/14/2016  . BREAST ENHANCEMENT SURGERY    . CHONDROPLASTY Left 10/31/2016   Procedure: LEFT KNEE CHONDROPLASTY;  Surgeon: Renette Butters, MD;  Location: [facility];  Service: Orthopedics;  Laterality: Left;  . ESOPHAGOGASTRODUODENOSCOPY (EGD) WITH PROPOFOL N/A 07/05/2015   Procedure: ESOPHAGOGASTRODUODENOSCOPY (EGD) WITH PROPOFOL;  Surgeon: Arta Silence, MD;  Location: [facility];  Service: Endoscopy;  Laterality: N/A;  . KNEE ARTHROSCOPY WITH LATERAL MENISECTOMY Left 10/31/2016   Procedure: LEFT KNEE ARTHROSCOPY WITH LATERAL MENISCECTOMY;  Surgeon: Renette Butters, MD;  Location: [facility];  Service: Orthopedics;  Laterality: Left;  . KNEE HARDWARE REMOVAL Right 11/30/2009   with debridement of scar tissue  . ORIF PROXIMAL TIBIAL PLATEAU FRACTURE Right 06/01/2009  . TOTAL VAGINAL HYSTERECTOMY  05/22/2000  . WISDOM TOOTH EXTRACTION      FAMILY HISTORY: Family History  Problem Relation Age of Onset  . Heart disease Paternal Grandfather   . Stomach cancer Paternal Grandmother   . Colon cancer Paternal Grandmother   . Heart murmur Father   . Heart disease Father   . Heart failure Father   . Anesthesia  problems Father        hard to wake up post-op    SOCIAL HISTORY:  Social History   Socioeconomic History  . Marital status: Married    Spouse name: Not on file  . Number of children: 2  . Years of education: Not on file  . Highest education level: Not on file  Occupational History  . Occupation: MRI Librarian, academic at [employer]: [place]  . Financial resource strain: Not on  file  . Food insecurity:    Worry: Not on file    Inability: Not on file  . Transportation needs:    Medical: Not on file    Non-medical: Not on file  Tobacco Use  . Smoking status: Never Smoker  . Smokeless tobacco: Never Used  Substance and Sexual Activity  . Alcohol use: No    Alcohol/week: 0.0 standard drinks  . Drug use: No  . Sexual activity: Not on file  Lifestyle  . Physical activity:    Days per week: Not on file    Minutes per session: Not on file  . Stress: Not on file  Relationships  . Social connections:    Talks on phone: Not on file    Gets together: Not on file    Attends religious service: Not on file    Active member of club or organization: Not on file    Attends meetings of clubs or organizations: Not on file    Relationship status: Not on file  . Intimate partner violence:    Fear of current or ex partner: Not on file    Emotionally abused: Not on file    Physically abused: Not on file    Forced sexual activity: Not on file  Other Topics Concern  . Not on file  Social History Narrative  . Not on file     PHYSICAL EXAM  There were no vitals filed for this visit.  There is no height or weight on file to calculate BMI.   General: The patient is well-developed and well-nourished and in no acute distress  Neurologic Exam  Mental status: The patient is alert and oriented x 3 at the time of the examination. The patient has apparent normal recent and remote memory, with an apparently normal attention span and concentration ability.   Speech is normal.  Cranial nerves: Extraocular muscles are intact.  Facial strength and sensation is normal.  Trapezius strength is normal.. No obvious hearing deficits are noted.  Motor:  Muscle bulk is normal.  Muscle tone is normal.  Strength is 5/5 in the arms and legs.  Sensory: She has intact sensation to touch and vibration. Coordination: Cerebellar testing shows good finger-nose-finger and  heel-to-shin.  Gait and station: Station is normal.  Her gait is normal.  Tandem gait is near normal.  Romberg is negative.  Reflexes: Deep tendon reflexes are normal in the arms but brisk in the legs with spread at the knees.Marland Kitchen            DIAGNOSTIC DATA (LABS, IMAGING, TESTING) - I reviewed patient records, labs, notes, testing and imaging myself where available.  Lab Results  Component Value Date   WBC 8.7 03/28/2015   HGB 13.7 03/28/2015   HCT 41.5 03/28/2015   MCV 87.7 03/28/2015   PLT 389.0 03/28/2015      Component Value Date/Time   NA 140 03/28/2015 0950   K 4.0 03/28/2015 0950   CL 107 03/28/2015  0950   CO2 27 03/28/2015 0950   GLUCOSE 90 03/28/2015 0950   BUN 21 03/28/2015 0950   CREATININE 0.70 03/28/2015 0950   CALCIUM 9.3 03/28/2015 0950   PROT 7.3 03/28/2015 0950   ALBUMIN 4.0 03/28/2015 0950   AST 13 03/28/2015 0950   ALT 9 03/28/2015 0950   ALKPHOS 65 03/28/2015 0950   BILITOT 0.4 03/28/2015 0950   GFRNONAA 109.73 02/21/2010 1302   GFRAA  05/31/2009 1000    >60        The eGFR has been calculated using the MDRD equation. This calculation has not been validated in all clinical situations. eGFR's persistently <60 mL/min signify possible Chronic Kidney Disease.   Lab Results  Component Value Date   CHOL 126 03/28/2015   HDL 34.20 (L) 03/28/2015   LDLCALC 82 03/28/2015   TRIG 48.0 03/28/2015   CHOLHDL 4 03/28/2015   No results found for: HGBA1C No results found for: VITAMINB12 Lab Results  Component Value Date   TSH 1.20 03/28/2015       ASSESSMENT AND PLAN  MULTIPLE SCLEROSIS  Other fatigue  Lhermitte's sign positive  Paresthesia   Richard A. Felecia Shelling, MD, PhD 0/12/2723, 3:66 PM Certified in Neurology, Clinical Neurophysiology, Sleep Medicine, Pain Medicine and Neuroimaging  [facility] [address] [place], [place] [address] [phone]

## 2018-07-13 ENCOUNTER — Telehealth: Payer: Self-pay | Admitting: Neurology

## 2018-07-13 NOTE — Telephone Encounter (Signed)
07/13/18 - LVM to schedule 6 month f/u with Dr. Felecia Shelling

## 2018-07-13 NOTE — Telephone Encounter (Signed)
-----   Message from Britt Bottom, MD sent at 07/09/2018  3:13 PM EDT ----- F/u in 6 months

## 2018-08-21 ENCOUNTER — Other Ambulatory Visit: Payer: Self-pay | Admitting: Neurology

## 2018-08-21 MED FILL — ONDANSETRON ODT 8 MG TABLET: 8 | 7 days supply | Qty: 20 | Fill #1

## 2018-08-24 MED FILL — AMPHETAMINE-DEXTROAMPHETAMI: 10 | 30 days supply | Qty: 30 | Fill #0

## 2018-08-24 NOTE — Telephone Encounter (Signed)
[place] Database Verified LR: 07/09/2018 Qty: 30 Pending appointment: No pending appt

## 2018-08-25 ENCOUNTER — Other Ambulatory Visit: Payer: Self-pay | Admitting: Neurology

## 2018-08-27 MED FILL — ADDERALL XR 25 MG CAPSULE: 25 | 30 days supply | Qty: 30 | Fill #0

## 2018-09-03 MED FILL — IMIPRAMINE HCL 10 MG TABLET: 10 | 30 days supply | Qty: 60 | Fill #1

## 2018-09-09 ENCOUNTER — Telehealth: Payer: Self-pay | Admitting: Neurology

## 2018-09-09 MED ORDER — MECLIZINE HCL 25 MG PO TABS: ORAL_TABLET | ORAL | 1 refills | Status: DC

## 2018-09-09 MED FILL — MECLIZINE 25 MG TABLET: 25 | 10 days supply | Qty: 30 | Fill #0

## 2018-09-09 NOTE — Telephone Encounter (Signed)
Spoke with Dr. Felecia Shelling- okay to call in meclizine 25mg  #30, 1 refill. Directions: Take 1 tablet TID prn.

## 2018-09-09 NOTE — Telephone Encounter (Signed)
Called pt. Relayed Dr. Garth Bigness recommendation. She was agreeable to this. I escribed to [facility] as requested.

## 2018-09-09 NOTE — Telephone Encounter (Signed)
Pt called stating that Friday she is going deep sea fishing and is wanting to know if she can get some kind of anti-nausea medication called in for her to the [facility]. Please advise.

## 2018-09-10 ENCOUNTER — Telehealth: Payer: Self-pay

## 2018-09-10 NOTE — Telephone Encounter (Signed)
Patient is calling back regarding Scotolamine patches. They are going deep sea fishing and is concerned about getting sick.   Patient is leaving now for her beach home. Requesting a change in the pharmacy. Please advise.  [facility] [address] [place], [place] [address] [phone]

## 2018-09-10 NOTE — Telephone Encounter (Signed)
Just received this message.  Please let Ms Atienza know that I am out of the office.  Message states needs scopolamine patches for multiple family members.  Not sure who else needs.  I do not mind sending in rx for patches for Ms Koch, but need to confirm if she has used previously and tolerated.  Need to confirm no allergy to patches.  If has used patches and no allergic rxn, then ok to send in rx for scopolamine patchecs - change q 72 hours.  (#10 with no refill).  If allergy, let me know.

## 2018-09-10 NOTE — Telephone Encounter (Signed)
Copied from [place] [id number]. Topic: General - Other >> Sep 10, 2018  8:15 AM Keene Breath wrote: Reason for CRM: Patient called to ask if the doctor could call in the patches for sea sickness for her and other family members who are also patients of Dr. Nicki Reaper.  They are all going deep sea fishing and are afraid they will get sick.  Please advise and call patient to let her know if this is possible.  CB# [phone] or work at [phone]

## 2018-09-10 NOTE — Telephone Encounter (Signed)
Pt called stating she missed a call. Did you call pt back? Please advise?  Thank you!

## 2018-09-10 NOTE — Telephone Encounter (Signed)
FORWARD TO DR Nicki Reaper SINCE THERE ARE MULTIPLE FAMILY MEMBERS INVOLVED IN REQUEST

## 2018-09-10 NOTE — Telephone Encounter (Signed)
Pt called to check the status of the request for sea sick patches for their families surprise Trip. Pt states they have to leave no later than 5pm for the Trip and wanted someone to call her when the patches are sent in / please advise

## 2018-09-11 NOTE — Telephone Encounter (Signed)
Tried to reach patient to confirm no allergy to scopolamine patch, and to advise can prescribe to patients only , left message to contact office [facility] nurse may advise.

## 2018-09-14 NOTE — Telephone Encounter (Signed)
Called pt. Deep sea fishing trip was Friday. Patches are no longer needed. Apologized to patient for not being able to get them sent in for her. Her family members are our patients as well

## 2018-09-22 DIAGNOSIS — M1712 Unilateral primary osteoarthritis, left knee: Secondary | ICD-10-CM | POA: Diagnosis not present

## 2018-10-13 ENCOUNTER — Other Ambulatory Visit: Payer: Self-pay | Admitting: Neurology

## 2018-10-14 MED FILL — ADDERALL XR 25 MG CAPSULE: 25 | 30 days supply | Qty: 30 | Fill #0

## 2018-10-14 NOTE — Telephone Encounter (Signed)
Called pt. She only needs refill on adderall 25mg  XR. Advised I will send request to Dr. Felecia Shelling.   Also made f/u for 01/12/19 at 4pm. Pt requested latest possible appt.

## 2018-10-22 DIAGNOSIS — M25562 Pain in left knee: Secondary | ICD-10-CM | POA: Diagnosis not present

## 2018-10-23 ENCOUNTER — Other Ambulatory Visit: Payer: Self-pay | Admitting: Orthopedic Surgery

## 2018-10-23 ENCOUNTER — Other Ambulatory Visit (HOSPITAL_COMMUNITY): Payer: Self-pay | Admitting: Orthopedic Surgery

## 2018-10-23 DIAGNOSIS — M25562 Pain in left knee: Secondary | ICD-10-CM

## 2018-10-30 ENCOUNTER — Other Ambulatory Visit: Payer: Self-pay

## 2018-10-30 ENCOUNTER — Ambulatory Visit (HOSPITAL_COMMUNITY)
Admission: RE | Admit: 2018-10-30 | Discharge: 2018-10-30 | Disposition: A | Discharge status: Home / Self Care | Payer: 59 | Source: Ambulatory Visit | Attending: Orthopedic Surgery | Admitting: Orthopedic Surgery

## 2018-10-30 DIAGNOSIS — M1712 Unilateral primary osteoarthritis, left knee: Secondary | ICD-10-CM | POA: Diagnosis not present

## 2018-10-30 DIAGNOSIS — M2242 Chondromalacia patellae, left knee: Secondary | ICD-10-CM | POA: Diagnosis not present

## 2018-10-30 DIAGNOSIS — M25562 Pain in left knee: Secondary | ICD-10-CM | POA: Diagnosis not present

## 2018-10-30 DIAGNOSIS — M25462 Effusion, left knee: Secondary | ICD-10-CM | POA: Diagnosis not present

## 2018-10-30 DIAGNOSIS — R600 Localized edema: Secondary | ICD-10-CM | POA: Diagnosis not present

## 2018-11-02 ENCOUNTER — Other Ambulatory Visit: Payer: Self-pay

## 2018-11-04 ENCOUNTER — Encounter: Payer: Self-pay | Admitting: Internal Medicine

## 2018-11-04 ENCOUNTER — Other Ambulatory Visit (HOSPITAL_COMMUNITY)
Admission: RE | Admit: 2018-11-04 | Discharge: 2018-11-04 | Disposition: A | Discharge status: Home / Self Care | Payer: 59 | Source: Ambulatory Visit | Attending: Internal Medicine | Admitting: Internal Medicine

## 2018-11-04 ENCOUNTER — Other Ambulatory Visit: Payer: Self-pay

## 2018-11-04 ENCOUNTER — Ambulatory Visit: Payer: 59 | Admitting: Internal Medicine

## 2018-11-04 VITALS — BP 112/62 | HR 88 | Temp 98.2°F | Resp 16 | Ht 67.0 in | Wt 137.6 lb

## 2018-11-04 DIAGNOSIS — G35 Multiple sclerosis: Secondary | ICD-10-CM

## 2018-11-04 DIAGNOSIS — R1032 Left lower quadrant pain: Secondary | ICD-10-CM

## 2018-11-04 DIAGNOSIS — Z0001 Encounter for general adult medical examination with abnormal findings: Secondary | ICD-10-CM | POA: Diagnosis not present

## 2018-11-04 DIAGNOSIS — D329 Benign neoplasm of meninges, unspecified: Secondary | ICD-10-CM

## 2018-11-04 DIAGNOSIS — Z124 Encounter for screening for malignant neoplasm of cervix: Secondary | ICD-10-CM | POA: Diagnosis not present

## 2018-11-04 DIAGNOSIS — Z13220 Encounter for screening for lipoid disorders: Secondary | ICD-10-CM | POA: Diagnosis not present

## 2018-11-04 DIAGNOSIS — R002 Palpitations: Secondary | ICD-10-CM

## 2018-11-04 DIAGNOSIS — Z0000 Encounter for general adult medical examination without abnormal findings: Secondary | ICD-10-CM

## 2018-11-04 LAB — COMPREHENSIVE METABOLIC PANEL
ALT: 11 U/L (ref 0–35)
AST: 13 U/L (ref 0–37)
Albumin: 4.2 g/dL (ref 3.5–5.2)
Alkaline Phosphatase: 54 U/L (ref 39–117)
BUN: 13 mg/dL (ref 6–23)
CO2: 29 mEq/L (ref 19–32)
Calcium: 9.6 mg/dL (ref 8.4–10.5)
Chloride: 104 mEq/L (ref 96–112)
Creatinine, Ser: 0.65 mg/dL (ref 0.40–1.20)
GFR: 97.36 mL/min (ref >60.00)
Glucose, Bld: 86 mg/dL (ref 70–99)
Potassium: 4.6 mEq/L (ref 3.5–5.1)
Sodium: 139 mEq/L (ref 135–145)
Total Bilirubin: 0.5 mg/dL (ref 0.2–1.2)
Total Protein: 6.7 g/dL (ref 6.0–8.3)

## 2018-11-04 LAB — TSH: TSH: 1.35 u[IU]/mL (ref 0.35–4.50)

## 2018-11-04 LAB — LIPID PANEL
Cholesterol: 166 mg/dL (ref 0–200)
HDL: 48.2 mg/dL (ref >39.00)
LDL Cholesterol: 101 mg/dL — ABNORMAL HIGH (ref 0–99)
NonHDL: 117.66
Total CHOL/HDL Ratio: 3
Triglycerides: 84 mg/dL (ref 0.0–149.0)
VLDL: 16.8 mg/dL (ref 0.0–40.0)

## 2018-11-04 LAB — CBC WITH DIFFERENTIAL/PLATELET
Basophils Absolute: 0.1 10*3/uL (ref 0.0–0.1)
Basophils Relative: 1.1 % (ref 0.0–3.0)
Eosinophils Absolute: 0.4 10*3/uL (ref 0.0–0.7)
Eosinophils Relative: 5.7 % — ABNORMAL HIGH (ref 0.0–5.0)
HCT: 40.3 % (ref 36.0–46.0)
Hemoglobin: 13.3 g/dL (ref 12.0–15.0)
Lymphocytes Relative: 29 % (ref 12.0–46.0)
Lymphs Abs: 2.2 10*3/uL (ref 0.7–4.0)
MCHC: 33 g/dL (ref 30.0–36.0)
MCV: 88.1 fl (ref 78.0–100.0)
Monocytes Absolute: 0.7 10*3/uL (ref 0.1–1.0)
Monocytes Relative: 9.5 % (ref 3.0–12.0)
Neutro Abs: 4.1 10*3/uL (ref 1.4–7.7)
Neutrophils Relative %: 54.7 % (ref 43.0–77.0)
Platelets: 368 10*3/uL (ref 150.0–400.0)
RBC: 4.57 Mil/uL (ref 3.87–5.11)
RDW: 13.7 % (ref 11.5–15.5)
WBC: 7.6 10*3/uL (ref 4.0–10.5)

## 2018-11-04 NOTE — Assessment & Plan Note (Addendum)
Physical today 11/04/18.  Mammogram due.  Schedule.  Given family history, have discussed referral to GI with question of need for colonoscopy. S/p hysterectomy.

## 2018-11-04 NOTE — Progress Notes (Signed)
Patient ID: Ashley May, female   DOB: 15-Dec-1970, 48 y.o.   MRN: [id number]   Subjective:    Patient ID: Ashley May, female    DOB: Feb 06, 1971, 48 y.o.   MRN: [id number]  HPI  Patient here for her physical exam.  She reports she is doing relatively well.  Has been having right knee pain.  Seeing ortho.  S/p injection.  Sees Dr Felecia Shelling for f/u MS.  Feels is stable.  Last evaluated 06/2018.  Recommended f/u in 6 months. Has noticed some palpitations.  Intermittent.  No known triggers.  No chest pain.  Tries to stay active.  Also has noticed some LLQ pain.  Persistent.  No nausea or vomiting.  Bowels stable.  No sob.  No cough or congestion.     Past Medical History:  Diagnosis Date   Chondromalacia of left patella 78/6767   Complication of anesthesia    slow to awaken after anesthesia for wisdom teeth extraction   Family history of adverse reaction to anesthesia    pt's father has hx. of being hard to wake up post-op   Lateral meniscus tear 10/2016   left   Meningioma (HCC)    right frontal   Migraines    Multiple sclerosis (HCC)    PONV (postoperative nausea and vomiting)    PVC's (premature ventricular contractions)    no current med.   Past Surgical History:  Procedure Laterality Date   AUGMENTATION MAMMAPLASTY Bilateral    1994   BREAST BIOPSY Right 08/14/2016   BREAST ENHANCEMENT SURGERY     CHONDROPLASTY Left 10/31/2016   Procedure: LEFT KNEE CHONDROPLASTY;  Surgeon: Renette Butters, MD;  Location: [facility];  Service: Orthopedics;  Laterality: Left;   ESOPHAGOGASTRODUODENOSCOPY (EGD) WITH PROPOFOL N/A 07/05/2015   Procedure: ESOPHAGOGASTRODUODENOSCOPY (EGD) WITH PROPOFOL;  Surgeon: Arta Silence, MD;  Location: [facility];  Service: Endoscopy;  Laterality: N/A;   KNEE ARTHROSCOPY WITH LATERAL MENISECTOMY Left 10/31/2016   Procedure: LEFT KNEE ARTHROSCOPY WITH LATERAL MENISCECTOMY;  Surgeon: Renette Butters, MD;  Location: [facility];  Service: Orthopedics;  Laterality: Left;   KNEE HARDWARE REMOVAL Right 11/30/2009   with debridement of scar tissue   ORIF PROXIMAL TIBIAL PLATEAU FRACTURE Right 06/01/2009   TOTAL VAGINAL HYSTERECTOMY  05/22/2000   WISDOM TOOTH EXTRACTION     Family History  Problem Relation Age of Onset   Heart disease Paternal Grandfather    Stomach cancer Paternal Grandmother    Colon cancer Paternal Grandmother    Heart murmur Father    Heart disease Father    Heart failure Father    Anesthesia problems Father        hard to wake up post-op   Social History   Socioeconomic History   Marital status: Married    Spouse name: Not on file   Number of children: 2   Years of education: Not on file   Highest education level: Not on file  Occupational History   Occupation: MRI Librarian, academic at [employer]: [place] resource strain: Not on file   Food insecurity    Worry: Not on file    Inability: Not on file   Transportation needs    Medical: Not on file    Non-medical: Not on file  Tobacco Use   Smoking status: Never Smoker   Smokeless tobacco: Never Used  Substance and Sexual Activity   Alcohol use: No  Alcohol/week: 0.0 standard drinks   Drug use: No   Sexual activity: Not on file  Lifestyle   Physical activity    Days per week: Not on file    Minutes per session: Not on file   Stress: Not on file  Relationships   Social connections    Talks on phone: Not on file    Gets together: Not on file    Attends religious service: Not on file    Active member of club or organization: Not on file    Attends meetings of clubs or organizations: Not on file    Relationship status: Not on file  Other Topics Concern   Not on file  Social History Narrative   Not on file    Outpatient Encounter Medications as of 11/04/2018  Medication Sig   amphetamine-dextroamphetamine (ADDERALL XR) 25 MG 24 hr capsule TAKE 1  CAPSULE BY MOUTH EVERY MORNING   amphetamine-dextroamphetamine (ADDERALL) 10 MG tablet TAKE 1 TABLET BY MOUTH IN THE AFTERNOON AS NEEDED   cholecalciferol (VITAMIN D) 1000 units tablet Take 8,000 Units by mouth daily.    imipramine (TOFRANIL) 10 MG tablet TAKE 1 TO 2 TABLETS BY MOUTH AT BEDTIME   Multiple Vitamin (MULTIVITAMIN) tablet Take 1 tablet by mouth daily.   ondansetron (ZOFRAN-ODT) 8 MG disintegrating tablet Take 1 tablet (8 mg total) by mouth every 8 (eight) hours as needed for nausea or vomiting.   Oxcarbazepine (TRILEPTAL) 300 MG tablet Take 1 tablet in the morning and 2 tablets at bedtime (Patient not taking: Reported on 07/08/2018)   topiramate (TOPAMAX) 50 MG tablet TAKE 1 TO 2 TABLETS BY MOUTH AT BEDTIME   [DISCONTINUED] meclizine (ANTIVERT) 25 MG tablet Take 1 tablet by mouth three times daily as needed   No facility-administered encounter medications on file as of 11/04/2018.     Review of Systems  Constitutional: Negative for appetite change and unexpected weight change.  HENT: Negative for congestion and sinus pressure.   Eyes: Negative for pain and visual disturbance.  Respiratory: Negative for cough, chest tightness and shortness of breath.   Cardiovascular: Negative for chest pain, palpitations and leg swelling.  Gastrointestinal: Negative for diarrhea, nausea and vomiting.       LLQ pain as outlined.    Genitourinary: Negative for difficulty urinating and dysuria.  Musculoskeletal: Negative for joint swelling and myalgias.  Skin: Negative for color change and rash.  Neurological: Negative for dizziness, light-headedness and headaches.  Hematological: Negative for adenopathy. Does not bruise/bleed easily.  Psychiatric/Behavioral: Negative for agitation and dysphoric mood.       Objective:    Physical Exam Constitutional:      General: She is not in acute distress.    Appearance: Normal appearance. She is well-developed.  HENT:     Right Ear: External  ear normal.     Left Ear: External ear normal.  Eyes:     General: No scleral icterus.       Right eye: No discharge.        Left eye: No discharge.     Conjunctiva/sclera: Conjunctivae normal.  Neck:     Musculoskeletal: Neck supple. No muscular tenderness.     Thyroid: No thyromegaly.  Cardiovascular:     Rate and Rhythm: Normal rate and regular rhythm.  Pulmonary:     Effort: No tachypnea, accessory muscle usage or respiratory distress.     Breath sounds: Normal breath sounds. No decreased breath sounds or wheezing.  Chest:  Breasts:        Right: No inverted nipple, mass, nipple discharge or tenderness (no axillary adenopathy).        Left: No inverted nipple, mass, nipple discharge or tenderness (no axilarry adenopathy).  Abdominal:     General: Bowel sounds are normal.     Palpations: Abdomen is soft.     Tenderness: There is no abdominal tenderness.  Musculoskeletal:        General: No swelling or tenderness.  Lymphadenopathy:     Cervical: No cervical adenopathy.  Skin:    Findings: No erythema or rash.  Neurological:     Mental Status: She is alert and oriented to person, place, and time.  Psychiatric:        Mood and Affect: Mood normal.        Behavior: Behavior normal.     BP 112/62    Pulse 88    Temp 98.2 F (36.8 C) (Oral)    Resp 16    Wt 137 lb 9.6 oz (62.4 kg)    SpO2 98%    BMI 20.92 kg/m  Wt Readings from Last 3 Encounters:  11/04/18 137 lb 9.6 oz (62.4 kg)  02/09/18 134 lb (60.8 kg)  11/03/17 131 lb (59.4 kg)     Lab Results  Component Value Date   WBC 7.6 11/04/2018   HGB 13.3 11/04/2018   HCT 40.3 11/04/2018   PLT 368.0 11/04/2018   GLUCOSE 86 11/04/2018   CHOL 166 11/04/2018   TRIG 84.0 11/04/2018   HDL 48.20 11/04/2018   LDLCALC 101 (H) 11/04/2018   ALT 11 11/04/2018   AST 13 11/04/2018   NA 139 11/04/2018   K 4.6 11/04/2018   CL 104 11/04/2018   CREATININE 0.65 11/04/2018   BUN 13 11/04/2018   CO2 29 11/04/2018   TSH 1.35  11/04/2018   INR 1.03 05/31/2009    Mr Knee Left Wo Contrast  Result Date: 10/30/2018 CLINICAL DATA:  Persistent knee pain.  History of 2 prior surgeries. EXAM: MRI OF THE LEFT KNEE WITHOUT CONTRAST TECHNIQUE: Multiplanar, multisequence MR imaging of the knee was performed. No intravenous contrast was administered. COMPARISON:  MRI a 01/20/2017 and 10/17/2016. FINDINGS: MENISCI Medial meniscus:  Intact.  No significant degenerative changes. Lateral meniscus: Surgical changes from prior partial lateral meniscectomy. There appears to be a recurrent longitudinal tear in the midbody region. The posterior horn is degenerated. LIGAMENTS Cruciates:  Intact.  Stable mucoid degeneration of the ACL. Collaterals:  Intact CARTILAGE Patellofemoral: Persistent mild/moderate degenerative chondrosis mainly at the patellar apex and along the medial facet with cartilage thinning and fissuring. The lateral compartment cartilage appears normal. Medial: Mild degenerative chondrosis. No significant cartilage thinning and no cartilage defects or osteochondral lesion. Lateral: Progressive lateral compartment degenerative chondrosis with cartilage thinning, fraying and fibrillation. There is also slight progression of joint space narrowing and spurring. Joint: Moderate-sized joint effusion. Superior patellar plica noted. There is also synovial thickening and slightly inflamed fat protruding into the suprapatellar bursa. There is also a complex rounded area of fibrosis and inflammation involving the posterior aspect of Hoffa's fat which is probably a combination of synovitis and fibrosis, possibly related to prior surgeries. Popliteal Fossa:  No popliteal mass or Baker's cyst. Extensor Mechanism: The patella retinacular structures are intact and the quadriceps and patellar tendons are intact. Bones: No acute bony findings. Mild edema like signal changes, likely stress related. No stress fracture. Other: Normal knee musculature.  IMPRESSION: 1. Surgical changes from a  prior partial lateral meniscectomy with a recurrent midbody meniscus tear. 2. Progressive lateral compartment degenerative changes. 3. Intact ligamentous structures and no acute bony findings. Stable mucoid degeneration of the ACL. 4. Moderate-sized joint effusion and areas of probable inflammation and fibrosis involving the suprapatellar bursa and the posterior aspect of Hoffa's fat. Electronically Signed   By: Marijo Sanes M.D.   On: 10/30/2018 09:18       Assessment & Plan:   Problem List Items Addressed This Visit    Health care maintenance    Physical today 11/04/18.  Mammogram due.  Schedule.  Given family history, have discussed referral to GI with question of need for colonoscopy. S/p hysterectomy.         LLQ pain    Pain LLQ.  Schedule for pelvic ultrasound.        Relevant Orders   US Pelvic Complete With Transvaginal   Meningioma [facility])    Has been followed by neurology.        Multiple sclerosis ([facility])    Followed by Dr Felecia Shelling.  Stable.  Last evaluated 06/2018. Recommended f/u in 6 months.        Relevant Orders   Comprehensive metabolic panel (Completed)   PALPITATIONS - Primary    Intermittent palpitations.  Noticed more recently.  EKG - SR with no acute ischemic changes.  Discussed further cardiac w/up, including possible monitor, etc.   She is agreeable.  Refer to cardiology.        Relevant Orders   EKG 12-Lead (Completed)   CBC with Differential/Platelet (Completed)   TSH (Completed)   Ambulatory referral to Cardiology    Other Visit Diagnoses    Cervical cancer screening       Relevant Orders   Cytology - PAP( [facility]) (Completed)   Ambulatory referral to Gastroenterology   Screening cholesterol level       Relevant Orders   Lipid panel (Completed)       Einar Pheasant, MD

## 2018-11-05 ENCOUNTER — Encounter: Payer: Self-pay | Admitting: Internal Medicine

## 2018-11-05 DIAGNOSIS — S83282D Other tear of lateral meniscus, current injury, left knee, subsequent encounter: Secondary | ICD-10-CM | POA: Diagnosis not present

## 2018-11-05 DIAGNOSIS — M25562 Pain in left knee: Secondary | ICD-10-CM | POA: Diagnosis not present

## 2018-11-05 LAB — CYTOLOGY - PAP
Diagnosis: NEGATIVE
HPV: NOT DETECTED

## 2018-11-08 ENCOUNTER — Encounter: Payer: Self-pay | Admitting: Internal Medicine

## 2018-11-08 DIAGNOSIS — R1032 Left lower quadrant pain: Secondary | ICD-10-CM | POA: Insufficient documentation

## 2018-11-08 NOTE — Assessment & Plan Note (Signed)
Pain LLQ.  Schedule for pelvic ultrasound.

## 2018-11-08 NOTE — Assessment & Plan Note (Signed)
Has been followed by neurology.   

## 2018-11-08 NOTE — Assessment & Plan Note (Signed)
Followed by Dr Felecia Shelling.  Stable.  Last evaluated 06/2018. Recommended f/u in 6 months.

## 2018-11-08 NOTE — Assessment & Plan Note (Signed)
Intermittent palpitations.  Noticed more recently.  EKG - SR with no acute ischemic changes.  Discussed further cardiac w/up, including possible monitor, etc.   She is agreeable.  Refer to cardiology.

## 2018-11-17 ENCOUNTER — Ambulatory Visit: Payer: 59

## 2018-12-01 DIAGNOSIS — G8918 Other acute postprocedural pain: Secondary | ICD-10-CM | POA: Diagnosis not present

## 2018-12-01 DIAGNOSIS — M23342 Other meniscus derangements, anterior horn of lateral meniscus, left knee: Secondary | ICD-10-CM | POA: Diagnosis not present

## 2018-12-01 DIAGNOSIS — M958 Other specified acquired deformities of musculoskeletal system: Secondary | ICD-10-CM | POA: Diagnosis not present

## 2018-12-01 MED FILL — HYDROCODON-APAP 5-325: 5-325 | 7 days supply | Qty: 30 | Fill #0

## 2018-12-01 MED FILL — METHOCARBAMOL 500 MG TABS: 500 | 10 days supply | Qty: 30 | Fill #0

## 2018-12-03 DIAGNOSIS — Z4789 Encounter for other orthopedic aftercare: Secondary | ICD-10-CM | POA: Diagnosis not present

## 2018-12-03 DIAGNOSIS — M25562 Pain in left knee: Secondary | ICD-10-CM | POA: Diagnosis not present

## 2018-12-08 MED FILL — oxyCODONE HCL 5 MG TABS: 5 | 7 days supply | Qty: 30 | Fill #0

## 2018-12-09 ENCOUNTER — Ambulatory Visit (HOSPITAL_COMMUNITY): Admission: RE | Admit: 2018-12-09 | Payer: 59 | Source: Ambulatory Visit

## 2018-12-19 IMAGING — MR MR KNEE*L* W/O CM
4 of 7 series · 19 of 40 positions shown · non-contrast
Comparison: None.

CLINICAL DATA: Twisting injury 2 days ago while lifting a heavy
object. Popping sensation at the time of injury with pain extending
down the calf. Lateral knee pain with difficulty bending the knee
and difficulty walking. Prior history of left knee surgery in 4557
and 4773.

EXAM:
MRI OF THE LEFT KNEE WITHOUT CONTRAST
TECHNIQUE: Multiplanar, multisequence MR imaging of the knee was performed. No
intravenous contrast was administered.

[Series 3: PD fat-sat · axial · 4.0mm · 0.29mm/px · z∈[-61,+86]mm · 7 of 31 slices shown (1 of 4)]
[im 1/31]
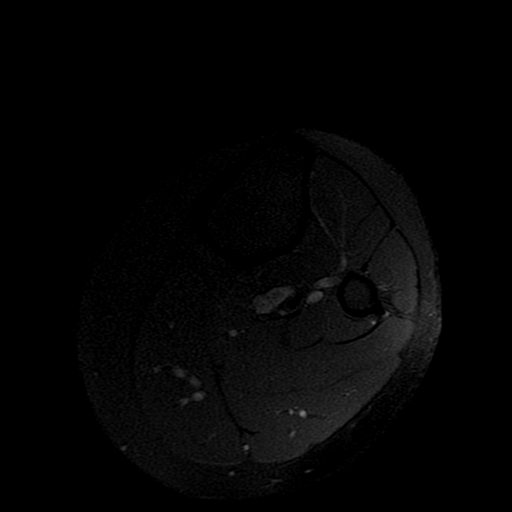
[im 6/31]
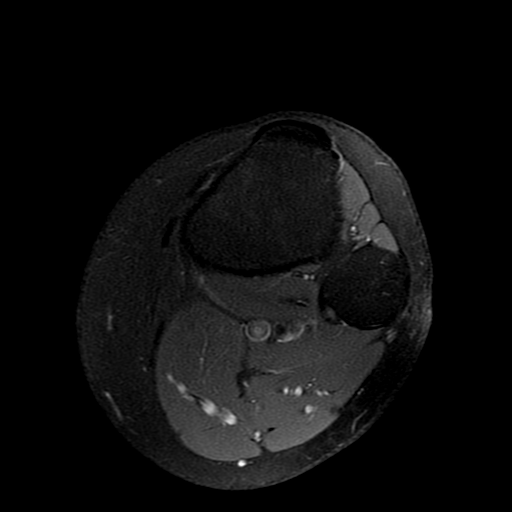
[im 11/31]
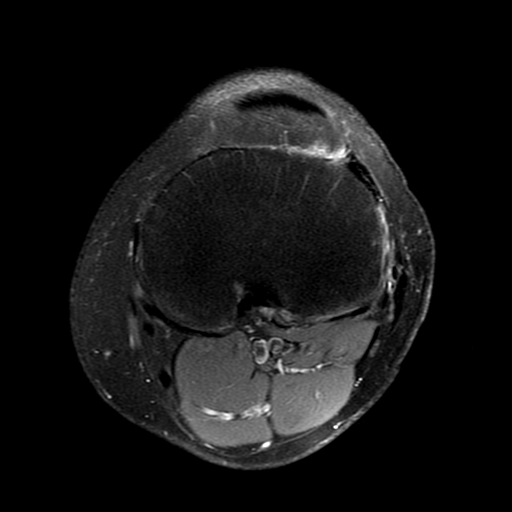
[im 16/31]
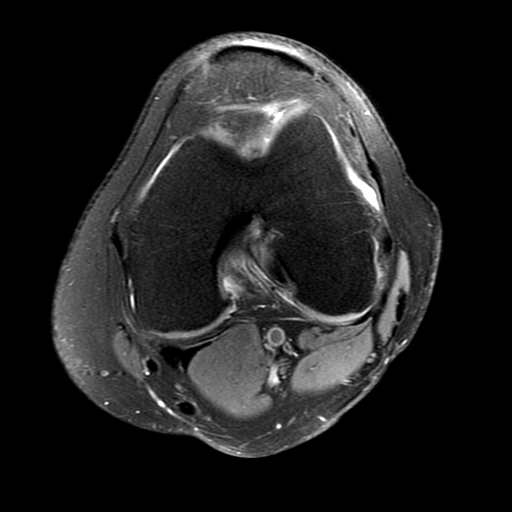
[im 21/31]
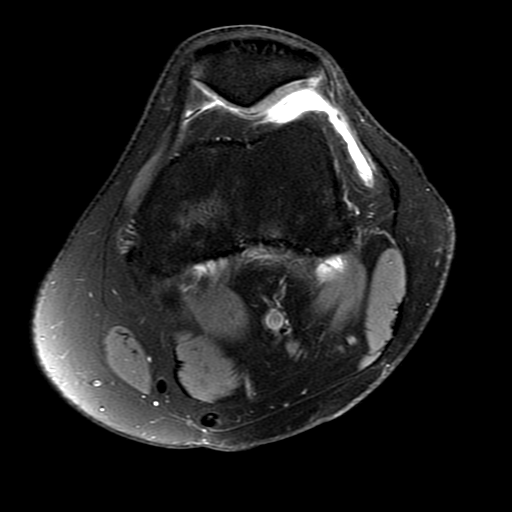
[im 26/31]
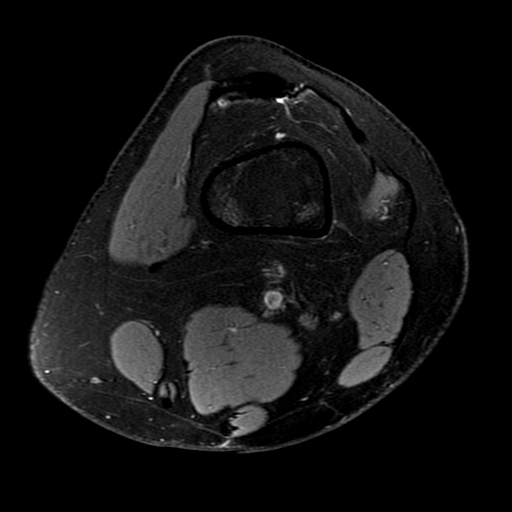
[im 31/31]
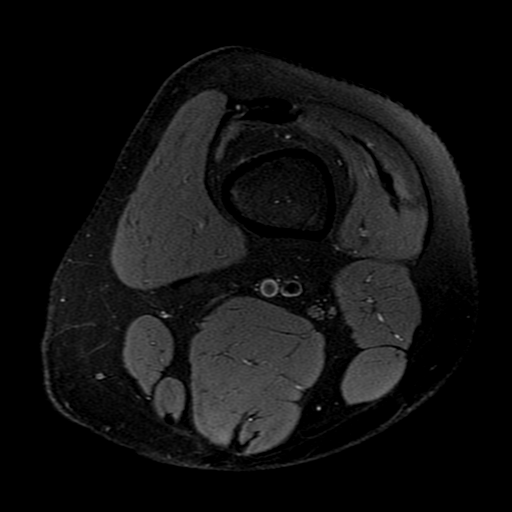

[Series 4: PD fat-sat · coronal · 4.0mm · 0.31mm/px · 6 of 24 slices shown (2 of 4)]
[im 1/24]
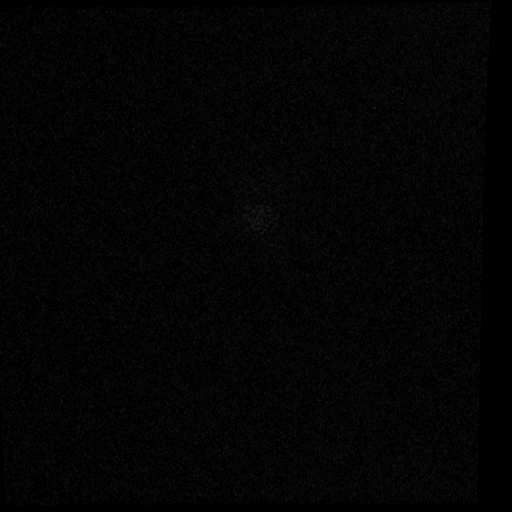
[im 4/24]
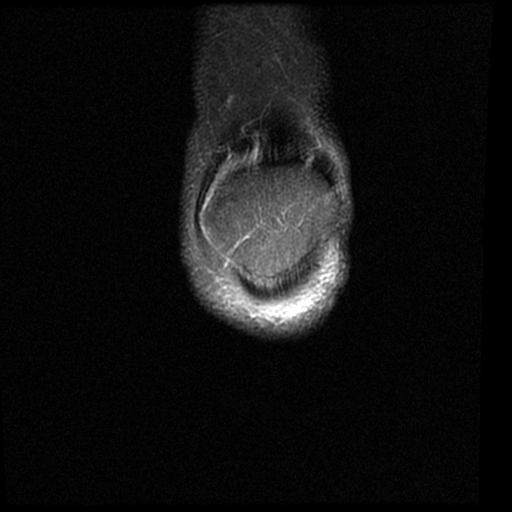
[im 8/24]
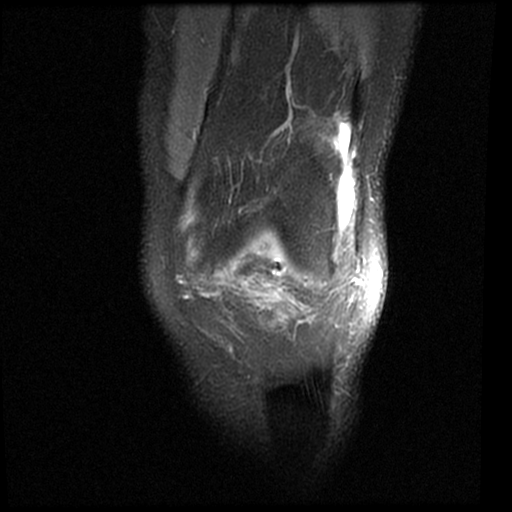
[im 12/24]
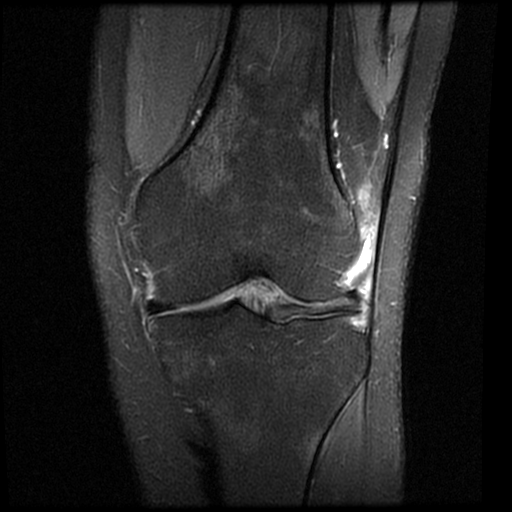
[im 16/24]
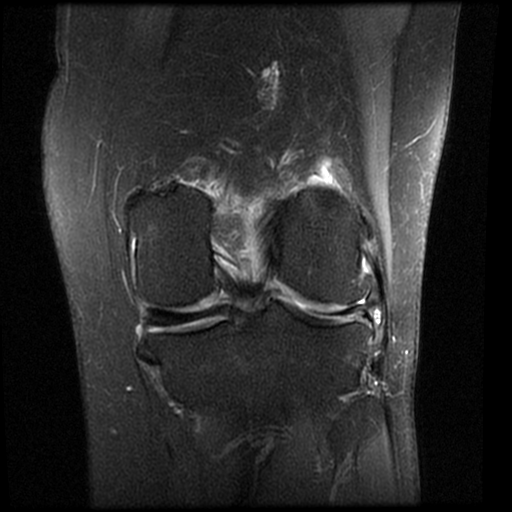
[im 20/24]
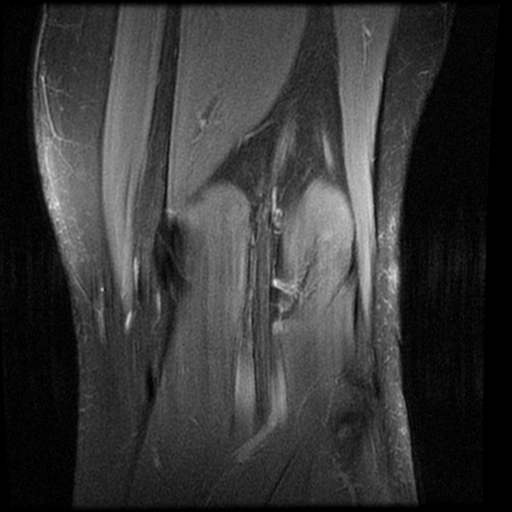

[Series 7: PD fat-sat · sagittal · 4.0mm · 0.31mm/px · 3 of 22 slices shown (3 of 4)]
[im 5/22]
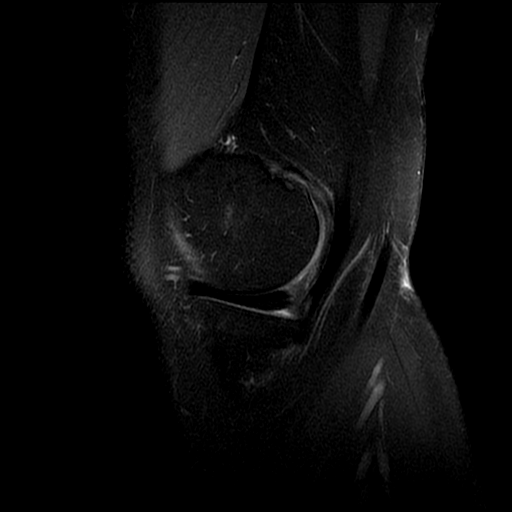
[im 13/22]
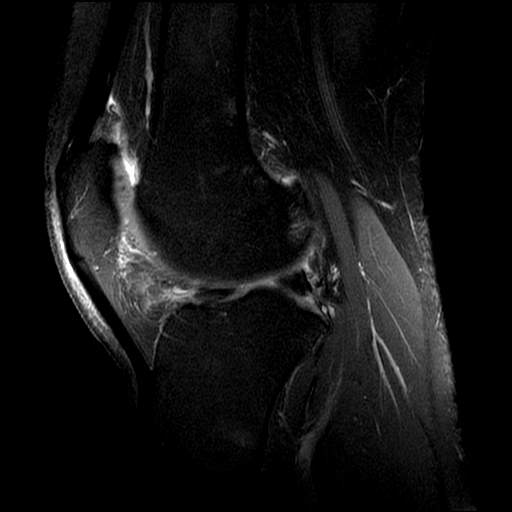
[im 22/22]
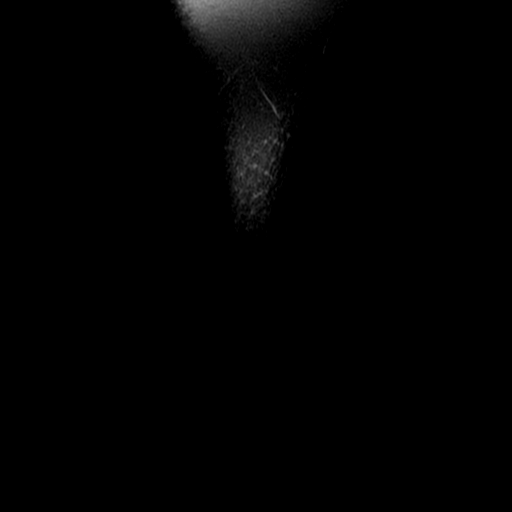

[Series 8: PD fat-sat · coronal · 2.0mm · 0.62mm/px · 3 of 12 slices shown (4 of 4)]
[im 1/12]
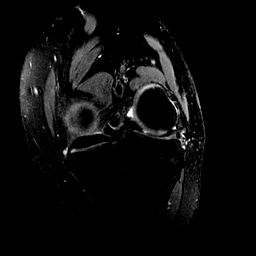
[im 6/12]
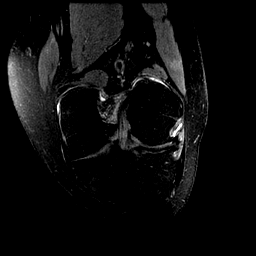
[im 12/12]
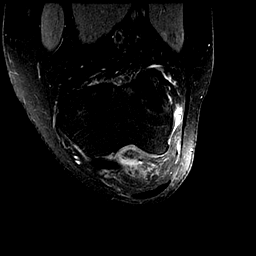

[19 of 40 positions shown; findings below may reference images not displayed]

FINDINGS: MENISCI

Medial meniscus:  Unremarkable

Lateral meniscus: There is abnormal grade 3 signal in the posterior
horn, midbody, and the anterior horn lateral meniscus. In the
posterior horn this extends obliquely to the inferior surface as
shown on image [DATE]. In the mid body the tear is horizontal
extending to the free edge, and in the anterior horn the tear
appears oblique and extending to the superior surface. I am
skeptical that this signal simply reflects postoperative changes,
particularly in light of the edema tracking along the periphery of
the midbody and anterior horn lateral meniscus which favors an acute
injury. Questionable small parameniscal cyst along the meniscal
periphery on image [DATE].

LIGAMENTS

Cruciates: Accentuated signal centrally in the ACL could reflect a
sprain or ACL degeneration, but the ACL is not overtly torn. PCL
intact.

Collaterals: Mild proximal tendinopathy in the popliteus tendon. The
fibular collateral ligament and remaining structures of the
posterolateral corner of the knee appear otherwise intact.

CARTILAGE

Patellofemoral: Mild to moderate degenerative chondral thinning
along the medial patellar facet and posterior patellar ridge, and
medially in the femoral trochlear groove. Minimal marginal spurring.

Medial:  Mild degenerative chondral thinning.

Lateral: Mild degenerative chondral thinning with marginal spurring.

Joint: Small knee effusion. Thin medial plica. Trace edema centrally
in Hoffa' s fat pad on image [DATE]. No free osteochondral fragment
observed.

Popliteal Fossa:  Unremarkable

Extensor Mechanism: There is a focal thickening and mild
irregularity along the distal vastus lateralis tendon with some
attenuation of the adjacent lateral patellar retinaculum on images 9
through 13 of series 3, query prior lateral release or prior remote
tear. There is a small amount of edema deep to the iliotibial band
in the soft tissues, but given the other findings I favor this being
related to the meniscal tear rather than ITB syndrome. Patellar
tendon intact, tibial tubercle -trochlear groove distance 1.2 cm
(normal).

Bones: No significant extra-articular osseous abnormalities
identified.

Other: No supplemental non-categorized findings.
IMPRESSION: 1. There is significant grade 3 tearing of the lateral meniscus,
with the tear extending obliquely to the inferior surface of the
posterior horn; horizontally to the free edge of the midbody; and
obliquely to the superior surface of the anterior horn. There is
potentially a tiny parameniscal cyst adjacent to the midbody
periphery, and a more significant amount of infiltrative soft tissue
edema along the periphery of the meniscus, especially the anterior
horn.
2. Some of this parameniscal edema is noted to be deep to the
iliotibial band, but I do favor that the edema is due to the
meniscal tear rather than ITB syndrome. There is no overt thickening
of the iliotibial band; the history seems more supportive of
meniscal tear rather than iliotibial band syndrome; and edema also
tracks with the meniscus rather than only being localized to the
area below the ITB.
3. Accentuated signal centrally in the ACL may reflect sprain or
degeneration but there is no overt ACL tear.
4. Mild osteoarthritis.
5. Mild proximal popliteus tendinopathy.
6. Small knee effusion with thin medial plica. Subtle edema
centrally in Hoffa's fat pad.
7. Irregular distal vastus lateralis tendon and adjacent lateral
retinaculum, without surrounding edema. Suspected discontinuity in
the lateral retinaculum. The appearance favors a prior lateral
retinacular release.

## 2018-12-22 ENCOUNTER — Other Ambulatory Visit: Payer: Self-pay | Admitting: Neurology

## 2018-12-23 MED FILL — ADDERALL XR 25 MG CAPSULE: 25 | 30 days supply | Qty: 30 | Fill #0

## 2018-12-23 NOTE — Telephone Encounter (Signed)
Checked drug registry. She last refilled rx 10/14/18 #30. Last seen 07/09/2018 and next f/u 01/12/19

## 2018-12-24 DIAGNOSIS — M6281 Muscle weakness (generalized): Secondary | ICD-10-CM | POA: Diagnosis not present

## 2018-12-24 DIAGNOSIS — M25562 Pain in left knee: Secondary | ICD-10-CM | POA: Diagnosis not present

## 2018-12-24 DIAGNOSIS — M25662 Stiffness of left knee, not elsewhere classified: Secondary | ICD-10-CM | POA: Diagnosis not present

## 2018-12-24 DIAGNOSIS — R262 Difficulty in walking, not elsewhere classified: Secondary | ICD-10-CM | POA: Diagnosis not present

## 2018-12-29 DIAGNOSIS — M25562 Pain in left knee: Secondary | ICD-10-CM | POA: Diagnosis not present

## 2018-12-29 DIAGNOSIS — M6281 Muscle weakness (generalized): Secondary | ICD-10-CM | POA: Diagnosis not present

## 2018-12-29 DIAGNOSIS — R262 Difficulty in walking, not elsewhere classified: Secondary | ICD-10-CM | POA: Diagnosis not present

## 2018-12-29 DIAGNOSIS — M25662 Stiffness of left knee, not elsewhere classified: Secondary | ICD-10-CM | POA: Diagnosis not present

## 2019-01-05 DIAGNOSIS — M25562 Pain in left knee: Secondary | ICD-10-CM | POA: Diagnosis not present

## 2019-01-05 DIAGNOSIS — M6281 Muscle weakness (generalized): Secondary | ICD-10-CM | POA: Diagnosis not present

## 2019-01-05 DIAGNOSIS — M25662 Stiffness of left knee, not elsewhere classified: Secondary | ICD-10-CM | POA: Diagnosis not present

## 2019-01-05 DIAGNOSIS — R262 Difficulty in walking, not elsewhere classified: Secondary | ICD-10-CM | POA: Diagnosis not present

## 2019-01-07 DIAGNOSIS — R262 Difficulty in walking, not elsewhere classified: Secondary | ICD-10-CM | POA: Diagnosis not present

## 2019-01-07 DIAGNOSIS — M25662 Stiffness of left knee, not elsewhere classified: Secondary | ICD-10-CM | POA: Diagnosis not present

## 2019-01-07 DIAGNOSIS — M25562 Pain in left knee: Secondary | ICD-10-CM | POA: Diagnosis not present

## 2019-01-12 ENCOUNTER — Ambulatory Visit: Payer: Self-pay | Admitting: Neurology

## 2019-01-12 DIAGNOSIS — M25662 Stiffness of left knee, not elsewhere classified: Secondary | ICD-10-CM | POA: Diagnosis not present

## 2019-01-12 DIAGNOSIS — R262 Difficulty in walking, not elsewhere classified: Secondary | ICD-10-CM | POA: Diagnosis not present

## 2019-01-12 DIAGNOSIS — M25562 Pain in left knee: Secondary | ICD-10-CM | POA: Diagnosis not present

## 2019-01-12 DIAGNOSIS — M6281 Muscle weakness (generalized): Secondary | ICD-10-CM | POA: Diagnosis not present

## 2019-01-14 DIAGNOSIS — M6281 Muscle weakness (generalized): Secondary | ICD-10-CM | POA: Diagnosis not present

## 2019-01-14 DIAGNOSIS — M25562 Pain in left knee: Secondary | ICD-10-CM | POA: Diagnosis not present

## 2019-01-14 DIAGNOSIS — R262 Difficulty in walking, not elsewhere classified: Secondary | ICD-10-CM | POA: Diagnosis not present

## 2019-01-14 DIAGNOSIS — M25662 Stiffness of left knee, not elsewhere classified: Secondary | ICD-10-CM | POA: Diagnosis not present

## 2019-01-19 DIAGNOSIS — M25562 Pain in left knee: Secondary | ICD-10-CM | POA: Diagnosis not present

## 2019-01-19 DIAGNOSIS — M25662 Stiffness of left knee, not elsewhere classified: Secondary | ICD-10-CM | POA: Diagnosis not present

## 2019-01-19 DIAGNOSIS — R262 Difficulty in walking, not elsewhere classified: Secondary | ICD-10-CM | POA: Diagnosis not present

## 2019-01-19 DIAGNOSIS — M6281 Muscle weakness (generalized): Secondary | ICD-10-CM | POA: Diagnosis not present

## 2019-02-01 ENCOUNTER — Other Ambulatory Visit: Payer: Self-pay | Admitting: Neurology

## 2019-02-01 MED FILL — ADDERALL XR 25 MG CAPSULE: 25 | 30 days supply | Qty: 30 | Fill #0

## 2019-02-01 NOTE — Telephone Encounter (Signed)
Dr.Sater pt would like refill on adderall.

## 2019-02-02 DIAGNOSIS — H5213 Myopia, bilateral: Secondary | ICD-10-CM | POA: Diagnosis not present

## 2019-02-02 MED FILL — GABAPENTIN 300 MG CAPSULE: 300 | 30 days supply | Qty: 30 | Fill #0

## 2019-02-02 MED FILL — MELOXICAM 15 MG TABLET: 15 | 30 days supply | Qty: 30 | Fill #0

## 2019-02-04 DIAGNOSIS — M6281 Muscle weakness (generalized): Secondary | ICD-10-CM | POA: Diagnosis not present

## 2019-02-04 DIAGNOSIS — M25662 Stiffness of left knee, not elsewhere classified: Secondary | ICD-10-CM | POA: Diagnosis not present

## 2019-02-04 DIAGNOSIS — R262 Difficulty in walking, not elsewhere classified: Secondary | ICD-10-CM | POA: Diagnosis not present

## 2019-02-04 DIAGNOSIS — M25562 Pain in left knee: Secondary | ICD-10-CM | POA: Diagnosis not present

## 2019-02-09 DIAGNOSIS — R262 Difficulty in walking, not elsewhere classified: Secondary | ICD-10-CM | POA: Diagnosis not present

## 2019-02-09 DIAGNOSIS — M25562 Pain in left knee: Secondary | ICD-10-CM | POA: Diagnosis not present

## 2019-02-09 DIAGNOSIS — M25662 Stiffness of left knee, not elsewhere classified: Secondary | ICD-10-CM | POA: Diagnosis not present

## 2019-02-09 DIAGNOSIS — M6281 Muscle weakness (generalized): Secondary | ICD-10-CM | POA: Diagnosis not present

## 2019-02-12 ENCOUNTER — Other Ambulatory Visit: Payer: Self-pay | Admitting: Neurology

## 2019-02-12 DIAGNOSIS — R262 Difficulty in walking, not elsewhere classified: Secondary | ICD-10-CM | POA: Diagnosis not present

## 2019-02-12 DIAGNOSIS — M25562 Pain in left knee: Secondary | ICD-10-CM | POA: Diagnosis not present

## 2019-02-12 DIAGNOSIS — M25662 Stiffness of left knee, not elsewhere classified: Secondary | ICD-10-CM | POA: Diagnosis not present

## 2019-02-12 DIAGNOSIS — M6281 Muscle weakness (generalized): Secondary | ICD-10-CM | POA: Diagnosis not present

## 2019-02-26 ENCOUNTER — Ambulatory Visit: Payer: 59 | Admitting: Interventional Cardiology

## 2019-03-01 DIAGNOSIS — M25662 Stiffness of left knee, not elsewhere classified: Secondary | ICD-10-CM | POA: Diagnosis not present

## 2019-03-01 DIAGNOSIS — M25562 Pain in left knee: Secondary | ICD-10-CM | POA: Diagnosis not present

## 2019-03-01 DIAGNOSIS — M6281 Muscle weakness (generalized): Secondary | ICD-10-CM | POA: Diagnosis not present

## 2019-03-01 DIAGNOSIS — R262 Difficulty in walking, not elsewhere classified: Secondary | ICD-10-CM | POA: Diagnosis not present

## 2019-03-01 MED FILL — ONDANSETRON ODT 8 MG TABLET: 8 | 7 days supply | Qty: 20 | Fill #2

## 2019-03-05 DIAGNOSIS — M25562 Pain in left knee: Secondary | ICD-10-CM | POA: Diagnosis not present

## 2019-03-05 DIAGNOSIS — M25662 Stiffness of left knee, not elsewhere classified: Secondary | ICD-10-CM | POA: Diagnosis not present

## 2019-03-05 DIAGNOSIS — R262 Difficulty in walking, not elsewhere classified: Secondary | ICD-10-CM | POA: Diagnosis not present

## 2019-03-05 DIAGNOSIS — M6281 Muscle weakness (generalized): Secondary | ICD-10-CM | POA: Diagnosis not present

## 2019-03-09 DIAGNOSIS — M25662 Stiffness of left knee, not elsewhere classified: Secondary | ICD-10-CM | POA: Diagnosis not present

## 2019-03-09 DIAGNOSIS — M6281 Muscle weakness (generalized): Secondary | ICD-10-CM | POA: Diagnosis not present

## 2019-03-09 DIAGNOSIS — M25562 Pain in left knee: Secondary | ICD-10-CM | POA: Diagnosis not present

## 2019-03-09 DIAGNOSIS — R262 Difficulty in walking, not elsewhere classified: Secondary | ICD-10-CM | POA: Diagnosis not present

## 2019-03-15 DIAGNOSIS — M6281 Muscle weakness (generalized): Secondary | ICD-10-CM | POA: Diagnosis not present

## 2019-03-15 DIAGNOSIS — R262 Difficulty in walking, not elsewhere classified: Secondary | ICD-10-CM | POA: Diagnosis not present

## 2019-03-15 DIAGNOSIS — M25662 Stiffness of left knee, not elsewhere classified: Secondary | ICD-10-CM | POA: Diagnosis not present

## 2019-03-15 DIAGNOSIS — M25562 Pain in left knee: Secondary | ICD-10-CM | POA: Diagnosis not present

## 2019-03-23 ENCOUNTER — Ambulatory Visit (INDEPENDENT_AMBULATORY_CARE_PROVIDER_SITE_OTHER): Payer: 59 | Admitting: Neurology

## 2019-03-23 ENCOUNTER — Other Ambulatory Visit: Payer: Self-pay

## 2019-03-23 ENCOUNTER — Encounter: Payer: Self-pay | Admitting: Neurology

## 2019-03-23 VITALS — BP 121/66 | HR 80 | Temp 97.4°F | Ht 68.5 in | Wt 134.0 lb

## 2019-03-23 DIAGNOSIS — R29818 Other symptoms and signs involving the nervous system: Secondary | ICD-10-CM

## 2019-03-23 DIAGNOSIS — R202 Paresthesia of skin: Secondary | ICD-10-CM | POA: Diagnosis not present

## 2019-03-23 DIAGNOSIS — R5383 Other fatigue: Secondary | ICD-10-CM

## 2019-03-23 DIAGNOSIS — G35 Multiple sclerosis: Secondary | ICD-10-CM | POA: Diagnosis not present

## 2019-03-23 DIAGNOSIS — G43009 Migraine without aura, not intractable, without status migrainosus: Secondary | ICD-10-CM

## 2019-03-23 MED ORDER — TOPIRAMATE 50 MG PO TABS: ORAL_TABLET | ORAL | 11 refills | Status: DC

## 2019-03-23 MED ORDER — AMPHETAMINE-DEXTROAMPHET ER 25 MG PO CP24: ORAL_CAPSULE | ORAL | 0 refills | Status: DC

## 2019-03-23 MED ORDER — AMPHETAMINE-DEXTROAMPHETAMINE 10 MG PO TABS: ORAL_TABLET | ORAL | 0 refills | Status: DC

## 2019-03-23 MED FILL — AMPHETAMINE-DEXTROAMPHETAMI: 10 | 30 days supply | Qty: 30 | Fill #0

## 2019-03-23 MED FILL — ADDERALL XR 25 MG CAPSULE: 25 | 30 days supply | Qty: 30 | Fill #0

## 2019-03-23 MED FILL — TOPIRAMATE 50 MG TABLET: 50 | 90 days supply | Qty: 270 | Fill #0

## 2019-03-23 NOTE — Progress Notes (Signed)
[facility]  PATIENT: Ashley May DOB: Dec 10, 1970  REFERRING DOCTOR OR PCP:  Einar Pheasant SOURCE: patient, records in EMR and MRI images on PACS/CD  _________________________________   HISTORICAL  CHIEF COMPLAINT:  Chief Complaint  Patient presents with  . Follow-up    Rm 13 here for f/u  . Multiple Sclerosis    Not on DMT needs a refill of adderall 10 mg. Pt reports she has been doing well no new sx     HISTORY OF PRESENT ILLNESS:  Ashley May is a 48 y.o. woman with multiple sclerosis.   Update 03/23/2019: She is doing well and remains off of a DMT (was on Betaseron many years ago for just a year or two).   Her last relapse was in 2000.      Gait, balance, sensation and strength.      She has a Lhermitte sign.    Bladder function is fine.    She feels vision is ok and saw ophthalmology recently.    She has fatigue.    She has to wake up early (4 am) and gets about 6 hours sleep.    Adderall helps.   She takes the XR in the morning and a 10 mg IR later in the day.        She has had more migraines lately.    If she sleeps her migrain usually goes away.       Her brother has MS also (PPMS) and has difficulty walking.  Update 07/09/2018: She feels her MS is stable and she has no exacerbations or new symptoms.    She has not been on a DMT x 6 years and has done well.   Gait, balance, strength and sensation are doing well.   Dysethesias improved on Oxcabazepine and she was able to stop a month ago and they did not return.  She has more fatigue, especially in the afternoons,   She is on Adderall XR 25 mg and takes at 530 am.  She is sleeping only 5-6 hours of sleep on work days.  Mood and cognition are doing well.  Migraines are doing well.  She notes more if sleep deprived but has done well since being on topiramate and imipramine 10    Update 11/03/2017: Her dysesthesias are doing better with Trileptal 300 mg po qAM and 600 mg po qPM.   The  dysesthesias were neck down and sometimes triggered by getting out of bed.   Bending over also caused some spells.    However, since she started the Tecfidera she is feeling much more tired during the days.  She does note that the Adderall is helping some.  Her current dose is 15 mg time release in the morning.   We discussed changes in dose  She denies any change in her gait or balance.  Strength is normal.  Although she has a dysesthesias, she denies numbness.   Bladder function is doing well.  She denies any changes in fatigue or mood.  Migraines are doing very well on Topamax and imipramine now only having 1-2 a month.    When one occurs Excedrin Migraine knocks it out.    Update 09/04/2017: She is noting episodes with a shock like sensation from her neck down lasting 10-20 seconds and associated with feeling weak.    Most of these spells occur a night when she changes positions in bed and only one occurred while sitting, but still with a change  in position.   These started about 2-3 months ago and now occur 2-4 times every night.      She has been sleeping worse due to these symptoms.      She has been off of disease modifying therapy since about 2014 and had been on Betaseron for many years.  She was diagnosed around 71 after presenting with a cervical transverse myelitis and other evidence of MS.  She has had a Lhermitte sign off and on since diagnosis though the current symptoms seem to be different.   Her last cervical spine MRI was around 2010 and I personally reviewed it showing several plaques in the upper cervical spine.  She notes no change in gait, strength, sensation or bladder function.   Vision is fine.     Her mood is doing okay though she does have more stress with her husband recently being diagnosed with cancer.  Update 04/10/2017: She had a sinus infection and URI concurrently.  She began to note vertigo and reduced balance.    With a scopolamine patch, she did better but  vertigo returned.    She took 10-14 days of amoxacillin.    Then she had a UTI and was on a cephalosporin.    She then had knee surgery 2 days ago.    With all these issues, she has noted that the vertigo returns 1 day or two after stopping scopolamine. She does not note veering with walking and the vertigo is not clearly a spinning or translational feeling but more like motion sickness like in the back seat of a car.   Her ENT doctor did not feel that this was due to her ear/vestibular system but rather MS.     Her last exacerbation was at least 15 years ago.   Her last MRI was in 2016 and was stable.   She has not any disease modifying therapy.  She feels gait has ben stabvle otehrwise.   No new numbness, weakness or bladder change.   Vision is unchanged.      Fatigue has been doing ok, even with infections.    Sleep is good.    Mood has been stale.   Cognition is fine.      From 10/07/2016 MS:    She feels her MS has been stable. Her last exacerbation was more than 15 years ago. She had several exacerbations in the first 5 years of her disease.   Her last MRI in 2016 did not show any new lesions.  Headache:   She reports a HA with pain in the occiput and pressure in the forehead and eyes.  She notes more stress at work with more hours.    It has been constant x 10 days.   They were doing better adter starting Topamax at the last visit.    Gait/strength/sensation: She feels that her gait is normal. She is able to walk long distances without difficulty. She can take steps with her eyes closed and not fall. The significant weakness in her arms or legs. She will rarely get the Lhermite sign and/or tingling in the chest.  Bladder: She denies any significant problems with her bladder. She has no urgency, frequency or hesitancy. She has no nocturia.  Vision:   She denies any MS related vision problem.    She had optic neuritis shortly after diagnosis with complete recovery..  Fatigue/sleep: This is been  changes in her work schedule with more hours, she is more tired. She  also feels a little bit worse than she is in the heat. Sleep has been much worse the last month.   She is sleeping only 5-6 hours due to changes in her schedule and difficulty with sleep maintenance.     She'll have nocturia once some nights.  Mood/cognition: She denies any depression or anxiety. She notes mild cognitive issues.    As an example, she goes into a room and is not sure why she is there and has mild forgetfulness   She notes this seems worse associated with longer hours at work (55-60 hours).   Meningioma:   MRIs have shown a small meningioma. This has been stable to slightly increased over many years.  MS History:   She presented in 1994 with a Lhermite sign and tingling in her hands. An MRI of the cervical spine revealed a cervical plaque. She also had an MRI of the brain and a lumbar puncture at that time. The studies were consistent with multiple sclerosis.   She was placed on Betaseron but had a lot of difficulty tolerating the medication. Specifically she would get severe myalgias and fatigue. Therefore, she used the medication intermittently over the next 7 years or so. In 2000, she delivered a healthy daughter. A few months later, she became pregnant again but this pregnancy was complicated by adenomyosis and she required a hysterectomy. Afterwards, she had an exacerbation. Her symptoms at that time included a poor gait with a Romberg sign. She was placed on several days of IV steroids and improved. Around 2001, she stopped Betaseron and has not been on any therapy since. Most of this time, she was seeing Dr. Jacqulynn Cadet. She last saw him in 2014.   I first saw her May 2016.      Review of studies:   MRI of the brain dated 08/18/2015 and compared it to the prior MRI. There are white matter lesions in a pattern and configuration consistent with multiple sclerosis. They are all located in the hemispheres and there are no  brainstem or cerebellar lesions.   Additionally, there is a 1.2 cm right frontal meningioma that was unchanged.   MRI of the cervical spine12/31/2007 shows midline posterior plaque at C2 and left posterior plaque at C3.    She had another MRI of the brain 06/08/2012 compared to 2011 MRI shows a similar pattern and configuration of white matter foci. In the interval, there has only been the development of only 1 new small plaque in the right cerebral hemisphere. As before, there were no posterior fossa lesions. That study was done without contrast.  The meningioma looks about the same size. Chronic left maxillary and ethmoid sinusitis is noted.  FH:  Her brother (one year older) has primary progressive MS and has right hemiplegia.       REVIEW OF SYSTEMS: Constitutional: No fevers, chills, sweats, or change in appetite.   She notes some fatigue and sleepiness. Eyes: No visual changes, double vision, eye pain Ear, nose and throat: No hearing loss, ear pain, nasal congestion, sore throat Cardiovascular: No chest pain, palpitations Respiratory: No shortness of breath at rest or with exertion.   No wheezes GastrointestinaI: No nausea, vomiting, diarrhea, abdominal pain, fecal incontinence Genitourinary: No dysuria, urinary retention or frequency.  No nocturia. Musculoskeletal: No neck pain, back pain Integumentary: No rash, pruritus, skin lesions Neurological: as above Psychiatric: No depression at this time.  No anxiety Endocrine: No palpitations, diaphoresis, change in appetite, change in weigh or increased thirst Hematologic/Lymphatic:  No anemia, purpura, petechiae. Allergic/Immunologic: No itchy/runny eyes, nasal congestion, recent allergic reactions, rashes  ALLERGIES: Allergies  Allergen Reactions  . Prochlorperazine Edisylate Anaphylaxis  . Eggs Or Egg-Derived Products Nausea And Vomiting  . Gadolinium Derivatives Nausea And Vomiting  . Morphine Itching    HOME  MEDICATIONS:  Current Outpatient Medications:  .  amphetamine-dextroamphetamine (ADDERALL XR) 25 MG 24 hr capsule, TAKE 1 CAPSULE BY MOUTH EVERY MORNING, Disp: 30 capsule, Rfl: 0 .  amphetamine-dextroamphetamine (ADDERALL) 10 MG tablet, TAKE 1 TABLET BY MOUTH IN THE AFTERNOON AS NEEDED, Disp: 30 tablet, Rfl: 0 .  cholecalciferol (VITAMIN D) 1000 units tablet, Take 8,000 Units by mouth daily. , Disp: , Rfl:  .  Multiple Vitamin (MULTIVITAMIN) tablet, Take 1 tablet by mouth daily., Disp: , Rfl:  .  ondansetron (ZOFRAN-ODT) 8 MG disintegrating tablet, Take 1 tablet (8 mg total) by mouth every 8 (eight) hours as needed for nausea or vomiting., Disp: 20 tablet, Rfl: 5 .  topiramate (TOPAMAX) 50 MG tablet, Take 2-3 po qHS, Disp: 270 tablet, Rfl: 11  PAST MEDICAL HISTORY: Past Medical History:  Diagnosis Date  . Chondromalacia of left patella 10/2016  . Complication of anesthesia    slow to awaken after anesthesia for wisdom teeth extraction  . Family history of adverse reaction to anesthesia    pt's father has hx. of being hard to wake up post-op  . Lateral meniscus tear 10/2016   left  . Meningioma ([place])    right frontal  . Migraines   . Multiple sclerosis ([place])   . PONV (postoperative nausea and vomiting)   . PVC's (premature ventricular contractions)    no current med.    PAST SURGICAL HISTORY: Past Surgical History:  Procedure Laterality Date  . AUGMENTATION MAMMAPLASTY Bilateral    1994  . BREAST BIOPSY Right 08/14/2016  . BREAST ENHANCEMENT SURGERY    . CHONDROPLASTY Left 10/31/2016   Procedure: LEFT KNEE CHONDROPLASTY;  Surgeon: Renette Butters, MD;  Location: [facility];  Service: Orthopedics;  Laterality: Left;  . ESOPHAGOGASTRODUODENOSCOPY (EGD) WITH PROPOFOL N/A 07/05/2015   Procedure: ESOPHAGOGASTRODUODENOSCOPY (EGD) WITH PROPOFOL;  Surgeon: Arta Silence, MD;  Location: [facility];  Service: Endoscopy;  Laterality: N/A;  . KNEE ARTHROSCOPY WITH LATERAL  MENISECTOMY Left 10/31/2016   Procedure: LEFT KNEE ARTHROSCOPY WITH LATERAL MENISCECTOMY;  Surgeon: Renette Butters, MD;  Location: [facility];  Service: Orthopedics;  Laterality: Left;  . KNEE HARDWARE REMOVAL Right 11/30/2009   with debridement of scar tissue  . ORIF PROXIMAL TIBIAL PLATEAU FRACTURE Right 06/01/2009  . TOTAL VAGINAL HYSTERECTOMY  05/22/2000  . WISDOM TOOTH EXTRACTION      FAMILY HISTORY: Family History  Problem Relation Age of Onset  . Heart disease Paternal Grandfather   . Stomach cancer Paternal Grandmother   . Colon cancer Paternal Grandmother   . Heart murmur Father   . Heart disease Father   . Heart failure Father   . Anesthesia problems Father        hard to wake up post-op    SOCIAL HISTORY:  Social History   Socioeconomic History  . Marital status: Married    Spouse name: Not on file  . Number of children: 2  . Years of education: Not on file  . Highest education level: Not on file  Occupational History  . Occupation: MRI Librarian, academic at [employer]: [employer]  Tobacco Use  . Smoking status: Never Smoker  . Smokeless tobacco:  Never Used  Substance and Sexual Activity  . Alcohol use: No    Alcohol/week: 0.0 standard drinks  . Drug use: No  . Sexual activity: Not on file  Other Topics Concern  . Not on file  Social History Narrative  . Not on file   Social Determinants of Health   Financial Resource Strain:   . Difficulty of Paying Living Expenses: Not on file  Food Insecurity:   . Worried About Charity fundraiser in the Last Year: Not on file  . Ran Out of Food in the Last Year: Not on file  Transportation Needs:   . Lack of Transportation (Medical): Not on file  . Lack of Transportation (Non-Medical): Not on file  Physical Activity:   . Days of Exercise per Week: Not on file  . Minutes of Exercise per Session: Not on file  Stress:   . Feeling of Stress : Not on file  Social Connections:   . Frequency of  Communication with Friends and Family: Not on file  . Frequency of Social Gatherings with Friends and Family: Not on file  . Attends Religious Services: Not on file  . Active Member of Clubs or Organizations: Not on file  . Attends Archivist Meetings: Not on file  . Marital Status: Not on file  Intimate Partner Violence:   . Fear of Current or Ex-Partner: Not on file  . Emotionally Abused: Not on file  . Physically Abused: Not on file  . Sexually Abused: Not on file     PHYSICAL EXAM  Vitals:   03/23/19 1253  BP: 121/66  Pulse: 80  Temp: (!) 97.4 F (36.3 C)  TempSrc: Temporal  Weight: 134 lb (60.8 kg)  Height: 5' 8.5" (1.74 m)    Body mass index is 20.08 kg/m.   General: The patient is well-developed and well-nourished and in no acute distress  Neurologic Exam  Mental status: The patient is alert and oriented x 3 at the time of the examination. The patient has apparent normal recent and remote memory, with an apparently normal attention span and concentration ability.   Speech is normal.  Cranial nerves: Extraocular muscles are intact.  Facial strength and sensation is normal.  Trapezius strength is normal.. No obvious hearing deficits are noted.  Motor:  Muscle bulk is normal.  Muscle tone is normal.  Strength is 5/5 in the arms and legs.  Sensory: Normal sensation to touch and vibration. . Coordination: Cerebellar testing shows good finger-nose-finger and heel-to-shin.  Gait and station: Station is normal.  Her gait is normal.  Tandem gait is slightly wide.  Romberg is negative.  Reflexes: Deep tendon reflexes are normal in the arms but brisk in the legs with spread at the knees.Marland Kitchen            DIAGNOSTIC DATA (LABS, IMAGING, TESTING) - I reviewed patient records, labs, notes, testing and imaging myself where available.  Lab Results  Component Value Date   WBC 7.6 11/04/2018   HGB 13.3 11/04/2018   HCT 40.3 11/04/2018   MCV 88.1 11/04/2018   PLT  368.0 11/04/2018      Component Value Date/Time   NA 139 11/04/2018 0927   K 4.6 11/04/2018 0927   CL 104 11/04/2018 0927   CO2 29 11/04/2018 0927   GLUCOSE 86 11/04/2018 0927   BUN 13 11/04/2018 0927   CREATININE 0.65 11/04/2018 0927   CALCIUM 9.6 11/04/2018 0927   PROT 6.7 11/04/2018 2505  ALBUMIN 4.2 11/04/2018 0927   AST 13 11/04/2018 0927   ALT 11 11/04/2018 0927   ALKPHOS 54 11/04/2018 0927   BILITOT 0.5 11/04/2018 0927   GFRNONAA 109.73 02/21/2010 1302   GFRAA  05/31/2009 1000    >60        The eGFR has been calculated using the MDRD equation. This calculation has not been validated in all clinical situations. eGFR's persistently <60 mL/min signify possible Chronic Kidney Disease.   Lab Results  Component Value Date   CHOL 166 11/04/2018   HDL 48.20 11/04/2018   LDLCALC 101 (H) 11/04/2018   TRIG 84.0 11/04/2018   CHOLHDL 3 11/04/2018   No results found for: HGBA1C No results found for: VITAMINB12 Lab Results  Component Value Date   TSH 1.35 11/04/2018       ASSESSMENT AND PLAN  Multiple sclerosis ([place])  Common migraine without intractability  Paresthesia  Other fatigue  Lhermitte's sign positive  1.   She has been stable and will continue off of a disease modifying therapy.  We will check MRI imaging periodically to make sure that she is not having significant breakthrough activity. 2.   Adderall XR 25 mg in the morning.  She may take a 10 mg immediate release later in the day if needed.  Try to get at least 6 hours of sleep 3.   The active exercise as tolerated .4.   Return in 6 months or sooner if there are new or worsening neurologic symptoms. Baylie Drakes A. Felecia Shelling, MD, PhD 79/49/9718, 2:09 PM Certified in Neurology, Clinical Neurophysiology, Sleep Medicine, Pain Medicine and Neuroimaging  [facility] [address], [place], [place] [address] [phone]

## 2019-03-29 DIAGNOSIS — M25562 Pain in left knee: Secondary | ICD-10-CM | POA: Diagnosis not present

## 2019-03-29 DIAGNOSIS — M25662 Stiffness of left knee, not elsewhere classified: Secondary | ICD-10-CM | POA: Diagnosis not present

## 2019-03-29 DIAGNOSIS — M6281 Muscle weakness (generalized): Secondary | ICD-10-CM | POA: Diagnosis not present

## 2019-03-29 DIAGNOSIS — R262 Difficulty in walking, not elsewhere classified: Secondary | ICD-10-CM | POA: Diagnosis not present

## 2019-04-01 DIAGNOSIS — M25662 Stiffness of left knee, not elsewhere classified: Secondary | ICD-10-CM | POA: Diagnosis not present

## 2019-04-01 DIAGNOSIS — M6281 Muscle weakness (generalized): Secondary | ICD-10-CM | POA: Diagnosis not present

## 2019-04-01 DIAGNOSIS — R262 Difficulty in walking, not elsewhere classified: Secondary | ICD-10-CM | POA: Diagnosis not present

## 2019-04-01 DIAGNOSIS — M25562 Pain in left knee: Secondary | ICD-10-CM | POA: Diagnosis not present

## 2019-04-07 DIAGNOSIS — Z7189 Other specified counseling: Secondary | ICD-10-CM | POA: Insufficient documentation

## 2019-04-07 NOTE — Progress Notes (Signed)
Cardiology Office Note   Date:  04/08/2019   ID:  Ashley May, DOB 21-Oct-1970, MRN [id number]  PCP:  Einar Pheasant, MD  Cardiologist:   Minus Breeding, MD   Chief Complaint  Patient presents with  . Palpitations      History of Present Illness: Ashley May is a 49 y.o. female who was referred by Einar Pheasant, MD for evaluation of palpitations.     She was seen by Dr. Fletcher Anon about six years ago for palpitations.  Echo in 2015 demonstrated a normal echo. She was found to have PVCs.     She has had increasing palpitations.  It feels similar to her previous PVCs.  However, they are more intense.  She feels a quivering or a "Jell-O" like sensation in her chest.  She gets nauseated and presyncopal.  They happen a few times a week.  The whole episode may last for minutes.  She cannot bring them on.  They happen when she is at work more often than at night.  He does not associate it with the little caffeine she takes in.  She is physically active and cannot bring them on.  She has no other symptoms such as chest pressure, neck or arm discomfort.  She has no shortness of breath, PND or orthopnea.  He had no weight gain or edema.    with past history of multiple sclerosis, meningioma and PVC's. She was referred for evaluation of increased palpitations. Her MS is stable. Has been seeing Dr Dellis Filbert. On no medications. No relapse in 13 years. Has a meningioma (1.2 cm per pt report). Has been stable and has been followed yearly. Stays active and exercises in a regular basis. she denies any chest pain or shortness of breath. She does have overall mild palpitations which have increased recently. She does not consume any caffeinated products and has no thyroid disease. She reports having PVCs for years.  Past Medical History:  Diagnosis Date  . Chondromalacia of left patella 10/2016  . Complication of anesthesia    slow to awaken after anesthesia for wisdom teeth extraction    . Family history of adverse reaction to anesthesia    pt's father has hx. of being hard to wake up post-op  . Lateral meniscus tear 10/2016   left  . Meningioma ([place])    right frontal  . Migraines   . Multiple sclerosis ([place])   . PONV (postoperative nausea and vomiting)   . PVC's (premature ventricular contractions)    no current med.    Past Surgical History:  Procedure Laterality Date  . AUGMENTATION MAMMAPLASTY Bilateral    1994  . BREAST BIOPSY Right 08/14/2016  . BREAST ENHANCEMENT SURGERY    . CHONDROPLASTY Left 10/31/2016   Procedure: LEFT KNEE CHONDROPLASTY;  Surgeon: Renette Butters, MD;  Location: [place];  Service: Orthopedics;  Laterality: Left;  . ESOPHAGOGASTRODUODENOSCOPY (EGD) WITH PROPOFOL N/A 07/05/2015   Procedure: ESOPHAGOGASTRODUODENOSCOPY (EGD) WITH PROPOFOL;  Surgeon: Arta Silence, MD;  [facility];  Service: Endoscopy;  Laterality: N/A;  . KNEE ARTHROSCOPY WITH LATERAL MENISECTOMY Left 10/31/2016   Procedure: LEFT KNEE ARTHROSCOPY WITH LATERAL MENISCECTOMY;  Surgeon: Renette Butters, MD;  Location: [place];  Service: Orthopedics;  Laterality: Left;  . KNEE HARDWARE REMOVAL Right 11/30/2009   with debridement of scar tissue  . ORIF PROXIMAL TIBIAL PLATEAU FRACTURE Right 06/01/2009  . TOTAL VAGINAL HYSTERECTOMY  05/22/2000  . WISDOM TOOTH EXTRACTION  Current Outpatient Medications  Medication Sig Dispense Refill  . amphetamine-dextroamphetamine (ADDERALL XR) 25 MG 24 hr capsule TAKE 1 CAPSULE BY MOUTH EVERY MORNING 30 capsule 0  . amphetamine-dextroamphetamine (ADDERALL) 10 MG tablet TAKE 1 TABLET BY MOUTH IN THE AFTERNOON AS NEEDED 30 tablet 0  . cholecalciferol (VITAMIN D) 1000 units tablet Take 8,000 Units by mouth daily.     . Multiple Vitamin (MULTIVITAMIN) tablet Take 1 tablet by mouth daily.    . ondansetron (ZOFRAN-ODT) 8 MG disintegrating tablet Take 1 tablet (8 mg total) by mouth every 8  (eight) hours as needed for nausea or vomiting. 20 tablet 5  . topiramate (TOPAMAX) 50 MG tablet Take 2-3 po qHS 270 tablet 11  . metoprolol succinate (TOPROL XL) 25 MG 24 hr tablet Take 1 tablet (25 mg total) by mouth daily. 90 tablet 3   No current facility-administered medications for this visit.    Allergies:   Prochlorperazine edisylate, Eggs or egg-derived products, Gadolinium derivatives, and Morphine    Social History:  The patient  reports that she has never smoked. She has never used smokeless tobacco. She reports that she does not drink alcohol or use drugs.   Family History:  The patient's family history includes Anesthesia problems in her father; Colon cancer in her paternal grandmother; Heart disease in her father and paternal grandfather; Heart failure in her father; Heart murmur in her father; Stomach cancer in her paternal grandmother.    ROS:  Please see the history of present illness.   Otherwise, review of systems are positive for none.   All other systems are reviewed and negative.    PHYSICAL EXAM: VS:  BP 116/68   Pulse 83   Temp 97.6 F (36.4 C)   Ht 5' 8.5" (1.74 m)   Wt 138 lb (62.6 kg)   SpO2 100%   BMI 20.68 kg/m  , BMI Body mass index is 20.68 kg/m. GENERAL:  Well appearing HEENT:  Pupils equal round and reactive, fundi not visualized, oral mucosa unremarkable NECK:  No jugular venous distention, waveform within normal limits, carotid upstroke brisk and symmetric, no bruits, no thyromegaly LYMPHATICS:  No cervical, inguinal adenopathy LUNGS:  Clear to auscultation bilaterally BACK:  No CVA tenderness CHEST:  Unremarkable HEART:  PMI not displaced or sustained,S1 and S2 within normal limits, no S3, no S4, no clicks, no rubs, no murmurs ABD:  Flat, positive bowel sounds normal in frequency in pitch, no bruits, no rebound, no guarding, no midline pulsatile mass, no hepatomegaly, no splenomegaly EXT:  2 plus pulses throughout, no edema, no cyanosis no  clubbing SKIN:  No rashes no nodules NEURO:  Cranial nerves II through XII grossly intact, motor grossly intact throughout PSYCH:  Cognitively intact, oriented to person place and time    EKG:  EKG is ordered today. The ekg ordered today demonstrates sinus rhythm, rate 83, axis within normal limits, intervals within normal limits, no acute ST-T wave changes, nonspecific T wave changes.   Recent Labs: 11/04/2018: ALT 11; BUN 13; Creatinine, Ser 0.65; Hemoglobin 13.3; Platelets 368.0; Potassium 4.6; Sodium 139; TSH 1.35    Lipid Panel    Component Value Date/Time   CHOL 166 11/04/2018 0927   TRIG 84.0 11/04/2018 0927   HDL 48.20 11/04/2018 0927   CHOLHDL 3 11/04/2018 0927   VLDL 16.8 11/04/2018 0927   LDLCALC 101 (H) 11/04/2018 0927      Wt Readings from Last 3 Encounters:  04/08/19 138 lb (62.6 kg)  03/23/19  134 lb (60.8 kg)  11/04/18 137 lb 9.6 oz (62.4 kg)      Other studies Reviewed: Additional studies/ records that were reviewed today include: Echo and previous cardiology work up. . Review of the above records demonstrates:  Please see elsewhere in the note.     ASSESSMENT AND PLAN:  PALPITATIONS: The patient likely is having recurrent PVCs.  I do not think further imaging or testing is indicated and I reviewed her lab work.  I will treat her symptomatically and start with metoprolol XL 25 mg daily.  She will let me know if she has any improvement with this or if her symptoms are still persistent and we could try a different dose or pulse channel blocker or a different beta-blocker.  COVID EDUCATION: We talked about the vaccine and she will consider it.  Current medicines are reviewed at length with the patient today.  The patient does not have concerns regarding medicines.  The following changes have been made:  no change  Labs/ tests ordered today include: None  Orders Placed This Encounter  Procedures  . EKG 12-Lead     Disposition:   FU with me as  needed.      Signed, Minus Breeding, MD  04/08/2019 3:37 PM    [facility]

## 2019-04-08 ENCOUNTER — Other Ambulatory Visit: Payer: Self-pay

## 2019-04-08 ENCOUNTER — Ambulatory Visit (INDEPENDENT_AMBULATORY_CARE_PROVIDER_SITE_OTHER): Payer: 59 | Admitting: Cardiology

## 2019-04-08 ENCOUNTER — Encounter: Payer: Self-pay | Admitting: Cardiology

## 2019-04-08 VITALS — BP 116/68 | HR 83 | Temp 97.6°F | Ht 68.5 in | Wt 138.0 lb

## 2019-04-08 DIAGNOSIS — Z7189 Other specified counseling: Secondary | ICD-10-CM | POA: Diagnosis not present

## 2019-04-08 DIAGNOSIS — R002 Palpitations: Secondary | ICD-10-CM

## 2019-04-08 MED ORDER — METOPROLOL SUCCINATE ER 25 MG PO TB24: 25.0000 mg | ORAL_TABLET | Freq: Every day | ORAL | 3 refills | Status: DC

## 2019-04-08 MED FILL — METOPROLOL SUCCINATE ER 25: 25 | 90 days supply | Qty: 90 | Fill #0

## 2019-04-08 NOTE — Patient Instructions (Signed)
Medication Instructions:  Start Toprol XL 25mg  daily *If you need a refill on your cardiac medications before your next appointment, please call your pharmacy*  Lab Work: None  Testing/Procedures: None  Follow-Up: At [facility], you and your health needs are our priority.  As part of our continuing mission to provide you with exceptional heart care, we have created designated Provider Care Teams.  These Care Teams include your primary Cardiologist (physician) and Advanced Practice Providers (APPs -  Physician Assistants and Nurse Practitioners) who all work together to provide you with the care you need, when you need it.   Other Instructions Follow up as needed

## 2019-04-09 DIAGNOSIS — M6281 Muscle weakness (generalized): Secondary | ICD-10-CM | POA: Diagnosis not present

## 2019-04-09 DIAGNOSIS — R262 Difficulty in walking, not elsewhere classified: Secondary | ICD-10-CM | POA: Diagnosis not present

## 2019-04-09 DIAGNOSIS — M25662 Stiffness of left knee, not elsewhere classified: Secondary | ICD-10-CM | POA: Diagnosis not present

## 2019-04-09 DIAGNOSIS — M25562 Pain in left knee: Secondary | ICD-10-CM | POA: Diagnosis not present

## 2019-04-12 DIAGNOSIS — M25562 Pain in left knee: Secondary | ICD-10-CM | POA: Diagnosis not present

## 2019-04-12 DIAGNOSIS — M6281 Muscle weakness (generalized): Secondary | ICD-10-CM | POA: Diagnosis not present

## 2019-04-12 DIAGNOSIS — R262 Difficulty in walking, not elsewhere classified: Secondary | ICD-10-CM | POA: Diagnosis not present

## 2019-04-12 DIAGNOSIS — M25662 Stiffness of left knee, not elsewhere classified: Secondary | ICD-10-CM | POA: Diagnosis not present

## 2019-04-16 DIAGNOSIS — M25562 Pain in left knee: Secondary | ICD-10-CM | POA: Diagnosis not present

## 2019-04-16 DIAGNOSIS — M1712 Unilateral primary osteoarthritis, left knee: Secondary | ICD-10-CM | POA: Diagnosis not present

## 2019-05-04 ENCOUNTER — Other Ambulatory Visit: Payer: Self-pay | Admitting: Neurology

## 2019-05-05 MED FILL — ADDERALL XR 25 MG CAPSULE: 25 | 30 days supply | Qty: 30 | Fill #0

## 2019-05-12 DIAGNOSIS — M25561 Pain in right knee: Secondary | ICD-10-CM | POA: Diagnosis not present

## 2019-05-12 DIAGNOSIS — M25662 Stiffness of left knee, not elsewhere classified: Secondary | ICD-10-CM | POA: Diagnosis not present

## 2019-05-12 DIAGNOSIS — M6281 Muscle weakness (generalized): Secondary | ICD-10-CM | POA: Diagnosis not present

## 2019-05-12 DIAGNOSIS — R262 Difficulty in walking, not elsewhere classified: Secondary | ICD-10-CM | POA: Diagnosis not present

## 2019-05-14 ENCOUNTER — Ambulatory Visit: Payer: 59 | Admitting: Internal Medicine

## 2019-05-26 DIAGNOSIS — M25662 Stiffness of left knee, not elsewhere classified: Secondary | ICD-10-CM | POA: Diagnosis not present

## 2019-05-26 DIAGNOSIS — R262 Difficulty in walking, not elsewhere classified: Secondary | ICD-10-CM | POA: Diagnosis not present

## 2019-05-26 DIAGNOSIS — M6281 Muscle weakness (generalized): Secondary | ICD-10-CM | POA: Diagnosis not present

## 2019-05-26 DIAGNOSIS — M25561 Pain in right knee: Secondary | ICD-10-CM | POA: Diagnosis not present

## 2019-05-31 DIAGNOSIS — M25561 Pain in right knee: Secondary | ICD-10-CM | POA: Diagnosis not present

## 2019-05-31 DIAGNOSIS — M6281 Muscle weakness (generalized): Secondary | ICD-10-CM | POA: Diagnosis not present

## 2019-05-31 DIAGNOSIS — M25662 Stiffness of left knee, not elsewhere classified: Secondary | ICD-10-CM | POA: Diagnosis not present

## 2019-05-31 DIAGNOSIS — R262 Difficulty in walking, not elsewhere classified: Secondary | ICD-10-CM | POA: Diagnosis not present

## 2019-06-07 ENCOUNTER — Telehealth: Payer: Self-pay | Admitting: Neurology

## 2019-06-07 NOTE — Telephone Encounter (Signed)
Pt called stating she was surprised with a trip to [place] and would like to know if she could be prescribed xanax - patches - adavan for the flight as she doesn't do well with motion sickness.

## 2019-06-07 NOTE — Telephone Encounter (Signed)
Called and LVM letting her know she should contact PCP office for this request.

## 2019-06-08 DIAGNOSIS — L814 Other melanin hyperpigmentation: Secondary | ICD-10-CM | POA: Diagnosis not present

## 2019-06-08 DIAGNOSIS — D2271 Melanocytic nevi of right lower limb, including hip: Secondary | ICD-10-CM | POA: Diagnosis not present

## 2019-06-08 DIAGNOSIS — L821 Other seborrheic keratosis: Secondary | ICD-10-CM | POA: Diagnosis not present

## 2019-06-08 DIAGNOSIS — D225 Melanocytic nevi of trunk: Secondary | ICD-10-CM | POA: Diagnosis not present

## 2019-06-08 DIAGNOSIS — Z7189 Other specified counseling: Secondary | ICD-10-CM | POA: Diagnosis not present

## 2019-06-08 DIAGNOSIS — D224 Melanocytic nevi of scalp and neck: Secondary | ICD-10-CM | POA: Diagnosis not present

## 2019-06-08 DIAGNOSIS — D2262 Melanocytic nevi of left upper limb, including shoulder: Secondary | ICD-10-CM | POA: Diagnosis not present

## 2019-06-08 DIAGNOSIS — L728 Other follicular cysts of the skin and subcutaneous tissue: Secondary | ICD-10-CM | POA: Diagnosis not present

## 2019-06-09 ENCOUNTER — Other Ambulatory Visit: Payer: Self-pay | Admitting: Neurology

## 2019-06-09 MED FILL — ADDERALL XR 25 MG CAPSULE: 25 | 30 days supply | Qty: 30 | Fill #0

## 2019-06-11 DIAGNOSIS — M25562 Pain in left knee: Secondary | ICD-10-CM | POA: Diagnosis not present

## 2019-06-14 DIAGNOSIS — M6281 Muscle weakness (generalized): Secondary | ICD-10-CM | POA: Diagnosis not present

## 2019-06-14 DIAGNOSIS — R262 Difficulty in walking, not elsewhere classified: Secondary | ICD-10-CM | POA: Diagnosis not present

## 2019-06-14 DIAGNOSIS — M25561 Pain in right knee: Secondary | ICD-10-CM | POA: Diagnosis not present

## 2019-06-14 DIAGNOSIS — M25662 Stiffness of left knee, not elsewhere classified: Secondary | ICD-10-CM | POA: Diagnosis not present

## 2019-06-15 ENCOUNTER — Telehealth: Payer: Self-pay | Admitting: Internal Medicine

## 2019-06-15 NOTE — Telephone Encounter (Signed)
Pt is going to [facility] [place] next week and would like 4-5 patches for mild valium or xanax called in because she is terrified to fly. Please advise.

## 2019-06-16 NOTE — Telephone Encounter (Signed)
Called patient. Unable to leave message.

## 2019-06-18 NOTE — Telephone Encounter (Signed)
Pt called said she just missed your call. She said she has her cell phone on her now.

## 2019-06-18 NOTE — Telephone Encounter (Signed)
Patient does not want valium patches. She would like to have scopalamine patches to use and then also requested a low dose ativan, xanax or something like that to have for the flight there and back because it is a very long flight. She leaves Wednesday morning. Needs sent to [facility] long out pt pharmacy

## 2019-06-19 MED ORDER — SCOPOLAMINE 1 MG/3DAYS TD PT72: 1.0000 | MEDICATED_PATCH | TRANSDERMAL | 0 refills | Status: DC

## 2019-06-19 MED ORDER — ALPRAZOLAM 0.25 MG PO TABS: 0.2500 mg | ORAL_TABLET | Freq: Two times a day (BID) | ORAL | 0 refills | Status: DC | PRN

## 2019-06-19 MED FILL — ALPRAZolam 0.25 MG TABS: 0.25 | 5 days supply | Qty: 10 | Fill #0

## 2019-06-19 MED FILL — SCOPOLAMINE 1 MG/3DAYS PT72: 1 | 30 days supply | Qty: 10 | Fill #0

## 2019-06-19 NOTE — Telephone Encounter (Signed)
rx for xanax and scopolamine patches sent to [facility].

## 2019-06-19 NOTE — Addendum Note (Signed)
Addended by: Alisa Graff on: 06/19/2019 09:45 AM   Modules accepted: Orders

## 2019-06-21 NOTE — Telephone Encounter (Signed)
Left detailed message for patient. Also let her know that she is over due for a 6 month follow up. Need to schedule

## 2019-06-29 ENCOUNTER — Ambulatory Visit: Payer: 59 | Admitting: Internal Medicine

## 2019-06-29 ENCOUNTER — Other Ambulatory Visit: Payer: Self-pay

## 2019-06-29 ENCOUNTER — Encounter: Payer: Self-pay | Admitting: Internal Medicine

## 2019-06-29 DIAGNOSIS — L03039 Cellulitis of unspecified toe: Secondary | ICD-10-CM

## 2019-06-29 DIAGNOSIS — M79676 Pain in unspecified toe(s): Secondary | ICD-10-CM | POA: Diagnosis not present

## 2019-06-29 DIAGNOSIS — G35 Multiple sclerosis: Secondary | ICD-10-CM | POA: Diagnosis not present

## 2019-06-29 MED ORDER — DOXYCYCLINE HYCLATE 100 MG PO TABS: 100.0000 mg | ORAL_TABLET | Freq: Two times a day (BID) | ORAL | 0 refills | Status: DC

## 2019-06-29 MED FILL — DOXYCYCLINE HYCLATE 100 MG: 100 | 7 days supply | Qty: 14 | Fill #0

## 2019-06-29 NOTE — Progress Notes (Signed)
Patient ID: Ashley May, female   DOB: May 04, 1970, 49 y.o.   MRN: [id number]   Subjective:    Patient ID: Ashley May, female    DOB: July 21, 1970, 49 y.o.   MRN: [id number]  HPI This visit occurred during the SARS-CoV-2 public health emergency.  Safety protocols were in place, including screening questions prior to the visit, additional usage of staff PPE, and extensive cleaning of exam room while observing appropriate contact time as indicated for disinfecting solutions.  Patient here as a work in appt with concerns regarding her toes. She went on a trip recently to the [facility].  Hiked 7.5 miles down.  Had on trail shoes.  Feet hurting.  Felt like they were freezing.  After her hike, noticed her toe nails - sore - underneath the nail bed - raised and swollen.  States she opened the area and it drained.  Has continued to do this.  Affected both great toes.  Applied neosporin.  Increased pain.  Not able to apply pressure to the area.  Not able to wear shoes.  No fever.  No redness extending up the toes.    Past Medical History:  Diagnosis Date  . Chondromalacia of left patella 10/2016  . Complication of anesthesia    slow to awaken after anesthesia for wisdom teeth extraction  . Family history of adverse reaction to anesthesia    pt's father has hx. of being hard to wake up post-op  . Lateral meniscus tear 10/2016   left  . Meningioma ([place])    right frontal  . Migraines   . Multiple sclerosis ([place])   . PONV (postoperative nausea and vomiting)   . PVC's (premature ventricular contractions)    no current med.   Past Surgical History:  Procedure Laterality Date  . AUGMENTATION MAMMAPLASTY Bilateral    1994  . BREAST BIOPSY Right 08/14/2016  . BREAST ENHANCEMENT SURGERY    . CHONDROPLASTY Left 10/31/2016   Procedure: LEFT KNEE CHONDROPLASTY;  Surgeon: Renette Butters, MD;  Location: [place];  Service: Orthopedics;  Laterality: Left;  .  ESOPHAGOGASTRODUODENOSCOPY (EGD) WITH PROPOFOL N/A 07/05/2015   Procedure: ESOPHAGOGASTRODUODENOSCOPY (EGD) WITH PROPOFOL;  Surgeon: Arta Silence, MD;  [facility];  Service: Endoscopy;  Laterality: N/A;  . KNEE ARTHROSCOPY WITH LATERAL MENISECTOMY Left 10/31/2016   Procedure: LEFT KNEE ARTHROSCOPY WITH LATERAL MENISCECTOMY;  Surgeon: Renette Butters, MD;  Location: [place];  Service: Orthopedics;  Laterality: Left;  . KNEE HARDWARE REMOVAL Right 11/30/2009   with debridement of scar tissue  . ORIF PROXIMAL TIBIAL PLATEAU FRACTURE Right 06/01/2009  . TOTAL VAGINAL HYSTERECTOMY  05/22/2000  . WISDOM TOOTH EXTRACTION     Family History  Problem Relation Age of Onset  . Heart disease Paternal Grandfather   . Stomach cancer Paternal Grandmother   . Colon cancer Paternal Grandmother   . Heart murmur Father   . Heart disease Father        TAVR, pacemaker  . Heart failure Father   . Anesthesia problems Father        hard to wake up post-op   Social History   Socioeconomic History  . Marital status: Married    Spouse name: Not on file  . Number of children: 2  . Years of education: Not on file  . Highest education level: Not on file  Occupational History  . Occupation: MRI Librarian, academic at [employer]: [employer]  Tobacco Use  .  Smoking status: Never Smoker  . Smokeless tobacco: Never Used  Substance and Sexual Activity  . Alcohol use: No    Alcohol/week: 0.0 standard drinks  . Drug use: No  . Sexual activity: Not on file  Other Topics Concern  . 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  Married.     Social Determinants of Health   Financial Resource Strain:   . Difficulty of Paying Living Expenses:   Food Insecurity:   . Worried About Charity fundraiser in the Last Year:   . Arboriculturist in the Last Year:   Transportation Needs:   . Film/video editor (Medical):   Marland Kitchen Lack of Transportation (Non-Medical):     Physical Activity:   . Days of Exercise per Week:   . Minutes of Exercise per Session:   Stress:   . Feeling of Stress :   Social Connections:   . Frequency of Communication with Friends and Family:   . Frequency of Social Gatherings with Friends and Family:   . Attends Religious Services:   . Active Member of Clubs or Organizations:   . Attends Archivist Meetings:   Marland Kitchen Marital Status:     Outpatient Encounter Medications as of 06/29/2019  Medication Sig  . ALPRAZolam (XANAX) 0.25 MG tablet Take 1 tablet (0.25 mg total) by mouth 2 (two) times daily as needed for anxiety.  Marland Kitchen amphetamine-dextroamphetamine (ADDERALL XR) 25 MG 24 hr capsule TAKE 1 CAPSULE BY MOUTH EVERY MORNING  . amphetamine-dextroamphetamine (ADDERALL) 10 MG tablet TAKE 1 TABLET BY MOUTH IN THE AFTERNOON AS NEEDED  . cholecalciferol (VITAMIN D) 1000 units tablet Take 8,000 Units by mouth daily.   Marland Kitchen doxycycline (VIBRA-TABS) 100 MG tablet Take 1 tablet (100 mg total) by mouth 2 (two) times daily.  . metoprolol succinate (TOPROL XL) 25 MG 24 hr tablet Take 1 tablet (25 mg total) by mouth daily.  . Multiple Vitamin (MULTIVITAMIN) tablet Take 1 tablet by mouth daily.  . ondansetron (ZOFRAN-ODT) 8 MG disintegrating tablet Take 1 tablet (8 mg total) by mouth every 8 (eight) hours as needed for nausea or vomiting.  Marland Kitchen scopolamine (TRANSDERM-SCOP, 1.5 MG,) 1 MG/3DAYS Place 1 patch (1.5 mg total) onto the skin every 3 (three) days.  Marland Kitchen topiramate (TOPAMAX) 50 MG tablet Take 2-3 po qHS   No facility-administered encounter medications on file as of 06/29/2019.    Review of Systems  Constitutional: Negative for appetite change and fever.  Respiratory: Negative for cough.   Cardiovascular: Negative for leg swelling.  Gastrointestinal: Negative for nausea and vomiting.  Musculoskeletal: Negative for joint swelling and myalgias.       Raised nail with increased fluid/pus accumulation - beneath the nails - bilateral great  toes.  Increased pain to palpation.  No increased erythema or warmth.    Skin: Negative for color change and rash.  Psychiatric/Behavioral: Negative for agitation and dysphoric mood.       Objective:    Physical Exam Constitutional:      General: She is not in acute distress.    Appearance: Normal appearance.  HENT:     Head: Normocephalic and atraumatic.  Neck:     Thyroid: No thyromegaly.  Cardiovascular:     Rate and Rhythm: Normal rate and regular rhythm.  Pulmonary:     Effort: No respiratory distress.     Breath sounds: Normal breath sounds. No wheezing.  Musculoskeletal:  General: No swelling or tenderness.     Comments: Raised nail with increased fluid/pus accumulation - beneath the nails - bilateral great toes.  Increased pain to palpation.  No increased erythema or warmth.   Skin:    Findings: No erythema or rash.  Neurological:     Mental Status: She is alert.  Psychiatric:        Mood and Affect: Mood normal.        Behavior: Behavior normal.     BP 118/66   Pulse 96   Temp 97.9 F (36.6 C)   Resp 16   Wt 138 lb (62.6 kg)   SpO2 99%   BMI 20.68 kg/m  Wt Readings from Last 3 Encounters:  06/29/19 138 lb (62.6 kg)  04/08/19 138 lb (62.6 kg)  03/23/19 134 lb (60.8 kg)     Lab Results  Component Value Date   WBC 7.6 11/04/2018   HGB 13.3 11/04/2018   HCT 40.3 11/04/2018   PLT 368.0 11/04/2018   GLUCOSE 86 11/04/2018   CHOL 166 11/04/2018   TRIG 84.0 11/04/2018   HDL 48.20 11/04/2018   LDLCALC 101 (H) 11/04/2018   ALT 11 11/04/2018   AST 13 11/04/2018   NA 139 11/04/2018   K 4.6 11/04/2018   CL 104 11/04/2018   CREATININE 0.65 11/04/2018   BUN 13 11/04/2018   CO2 29 11/04/2018   TSH 1.35 11/04/2018   INR 1.03 05/31/2009       Assessment & Plan:   Problem List Items Addressed This Visit    Infected nailbed of toe   Relevant Orders   Ambulatory referral to Podiatry   Multiple sclerosis ([place])    Followed by neurology.  Stable.        Pain around toenail    Pain and swelling and concern regarding infection - bilateral great toes.  She has drained daily.  Hold on drainage today.  Treat with doxycycline.  Epsom salt - soaks.  Refer to podiatry.            Einar Pheasant, MD

## 2019-07-02 ENCOUNTER — Other Ambulatory Visit: Payer: Self-pay

## 2019-07-02 ENCOUNTER — Ambulatory Visit: Payer: 59 | Admitting: Podiatry

## 2019-07-02 DIAGNOSIS — L03031 Cellulitis of right toe: Secondary | ICD-10-CM | POA: Diagnosis not present

## 2019-07-02 DIAGNOSIS — L03032 Cellulitis of left toe: Secondary | ICD-10-CM

## 2019-07-02 DIAGNOSIS — L03039 Cellulitis of unspecified toe: Secondary | ICD-10-CM

## 2019-07-02 MED ORDER — GENTAMICIN SULFATE 0.1 % EX CREA: 1.0000 "application " | TOPICAL_CREAM | Freq: Two times a day (BID) | CUTANEOUS | 1 refills | Status: DC

## 2019-07-02 MED FILL — GENTAMICIN SULFATE 0.1 % CR: 0.1 | 7 days supply | Qty: 15 | Fill #0

## 2019-07-02 NOTE — Patient Instructions (Signed)

## 2019-07-04 DIAGNOSIS — M79676 Pain in unspecified toe(s): Secondary | ICD-10-CM | POA: Insufficient documentation

## 2019-07-04 NOTE — Assessment & Plan Note (Signed)
Pain and swelling and concern regarding infection - bilateral great toes.  She has drained daily.  Hold on drainage today.  Treat with doxycycline.  Epsom salt - soaks.  Refer to podiatry.

## 2019-07-04 NOTE — Assessment & Plan Note (Signed)
Followed by neurology.  Stable  

## 2019-07-12 NOTE — Progress Notes (Signed)
   HPI: 49 y.o. female presenting today for evaluation of pain to the bilateral great toenails.  Patient recently returned from a trip to the [facility] where she walked approximately 20 miles.  This was approximately 1 week ago when she noticed tenderness and swelling with blister formation around the bilateral great toenails.  She is currently taking doxycycline from her primary care physician  Past Medical History:  Diagnosis Date  . Chondromalacia of left patella 10/2016  . Complication of anesthesia    slow to awaken after anesthesia for wisdom teeth extraction  . Family history of adverse reaction to anesthesia    pt's father has hx. of being hard to wake up post-op  . Lateral meniscus tear 10/2016   left  . Meningioma ([place])    right frontal  . Migraines   . Multiple sclerosis ([place])   . PONV (postoperative nausea and vomiting)   . PVC's (premature ventricular contractions)    no current med.     Physical Exam: General: The patient is alert and oriented x3 in no acute distress.  Dermatology: Skin is warm, dry and supple bilateral lower extremities. Negative for open lesions or macerations.  There is fluctuance noted around the bilateral great toenails and the toenails are loosely adhered.  Vascular: Palpable pedal pulses bilaterally. No edema or erythema noted. Capillary refill within normal limits.  Neurological: Epicritic and protective threshold grossly intact bilaterally.   Musculoskeletal Exam: Range of motion within normal limits to all pedal and ankle joints bilateral. Muscle strength 5/5 in all groups bilateral.   Assessment: 1.  Loosely adhered dystrophic nails bilateral great toes secondary to trauma   Plan of Care:  1. Patient evaluated.  2.  Total temporary nail avulsions were performed to the bilateral great toes after the toes were blocked and prepped in an aseptic manner.  Digital block performed using 3 mL of 2% lidocaine plain.  Nails removed in toto and  light dressing was applied. 3.  Prescription for gentamicin cream 4.  Continue doxycycline from PCP 5.  Return to clinic as needed      Edrick Kins, DPM [facility]  Dr. Edrick Kins, DPM    [address], [place] [address]                Office [phone]  Fax [phone]

## 2019-07-15 ENCOUNTER — Other Ambulatory Visit: Payer: Self-pay | Admitting: Neurology

## 2019-07-16 MED FILL — ADDERALL XR 25 MG CAPSULE: 25 | 30 days supply | Qty: 30 | Fill #0

## 2019-07-20 DIAGNOSIS — M6281 Muscle weakness (generalized): Secondary | ICD-10-CM | POA: Diagnosis not present

## 2019-07-20 DIAGNOSIS — M25662 Stiffness of left knee, not elsewhere classified: Secondary | ICD-10-CM | POA: Diagnosis not present

## 2019-07-20 DIAGNOSIS — M25561 Pain in right knee: Secondary | ICD-10-CM | POA: Diagnosis not present

## 2019-07-20 DIAGNOSIS — R262 Difficulty in walking, not elsewhere classified: Secondary | ICD-10-CM | POA: Diagnosis not present

## 2019-07-29 ENCOUNTER — Telehealth: Payer: Self-pay | Admitting: Internal Medicine

## 2019-07-29 NOTE — Telephone Encounter (Signed)
Rejection Reason - Patient Declined" [facility] said 2 minutes ago

## 2019-08-12 DIAGNOSIS — M25561 Pain in right knee: Secondary | ICD-10-CM | POA: Diagnosis not present

## 2019-08-12 DIAGNOSIS — R262 Difficulty in walking, not elsewhere classified: Secondary | ICD-10-CM | POA: Diagnosis not present

## 2019-08-12 DIAGNOSIS — M25662 Stiffness of left knee, not elsewhere classified: Secondary | ICD-10-CM | POA: Diagnosis not present

## 2019-08-12 DIAGNOSIS — M6281 Muscle weakness (generalized): Secondary | ICD-10-CM | POA: Diagnosis not present

## 2019-08-16 ENCOUNTER — Other Ambulatory Visit: Payer: Self-pay | Admitting: Neurology

## 2019-08-16 MED FILL — ADDERALL XR 25 MG CAPSULE: 25 | 30 days supply | Qty: 30 | Fill #0

## 2019-08-31 DIAGNOSIS — M1712 Unilateral primary osteoarthritis, left knee: Secondary | ICD-10-CM | POA: Diagnosis not present

## 2019-08-31 DIAGNOSIS — M25562 Pain in left knee: Secondary | ICD-10-CM | POA: Diagnosis not present

## 2019-08-31 DIAGNOSIS — M170 Bilateral primary osteoarthritis of knee: Secondary | ICD-10-CM | POA: Diagnosis not present

## 2019-08-31 DIAGNOSIS — M1711 Unilateral primary osteoarthritis, right knee: Secondary | ICD-10-CM | POA: Diagnosis not present

## 2019-09-11 ENCOUNTER — Other Ambulatory Visit: Payer: Self-pay | Admitting: Neurology

## 2019-09-12 ENCOUNTER — Other Ambulatory Visit: Payer: Self-pay | Admitting: Neurology

## 2019-09-13 MED FILL — ONDANSETRON ODT 8 MG TABLET: 8 | 7 days supply | Qty: 20 | Fill #0

## 2019-09-16 ENCOUNTER — Other Ambulatory Visit: Payer: Self-pay | Admitting: Neurology

## 2019-09-17 MED FILL — ADDERALL XR 25 MG CAPSULE: 25 | 30 days supply | Qty: 30 | Fill #0

## 2019-09-21 ENCOUNTER — Ambulatory Visit: Payer: 59 | Admitting: Family Medicine

## 2019-09-21 ENCOUNTER — Encounter: Payer: Self-pay | Admitting: Family Medicine

## 2019-09-21 ENCOUNTER — Other Ambulatory Visit: Payer: Self-pay

## 2019-09-21 VITALS — BP 111/65 | HR 74 | Ht 68.5 in | Wt 134.0 lb

## 2019-09-21 DIAGNOSIS — G43009 Migraine without aura, not intractable, without status migrainosus: Secondary | ICD-10-CM | POA: Diagnosis not present

## 2019-09-21 DIAGNOSIS — R5383 Other fatigue: Secondary | ICD-10-CM | POA: Diagnosis not present

## 2019-09-21 DIAGNOSIS — E559 Vitamin D deficiency, unspecified: Secondary | ICD-10-CM

## 2019-09-21 DIAGNOSIS — G35 Multiple sclerosis: Secondary | ICD-10-CM | POA: Diagnosis not present

## 2019-09-21 MED ORDER — AMPHETAMINE-DEXTROAMPHET ER 30 MG PO CP24: 30.0000 mg | ORAL_CAPSULE | Freq: Every day | ORAL | 0 refills | Status: DC

## 2019-09-21 NOTE — Progress Notes (Signed)
PATIENT: Ashley May DOB: 10/10/1970  REASON FOR VISIT: follow up HISTORY FROM: patient  Chief Complaint  Patient presents with  . Follow-up    ms fu, rm 16, pt states she is doing well,      HISTORY OF PRESENT ILLNESS: Today 09/21/19 Ashley May is a 49 y.o. female here today for follow up for MS. She is not currently on DMT. Last exacerbation in 2000. Last imaging in 08/2017 and thought to be stable. She continues Adderall 40m XR daily in the mornings and 127mIR in the afternoons as needed. She often forgets the afternoon dose. She does report more fatigue recently and contributes this to working longer shifts. 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 She continues vitamin D 8000iu daily OTC. She denies any new or exacerbating symptoms.    HISTORY: (copied from Dr SatGarth Bignesste on 03/23/2019)  Ashley May a 48 6o. woman with multiple sclerosis.   Update 03/23/2019: She is doing well and remains off of a DMT (was on Betaseron many years ago for just a year or two).   Her last relapse was in 2000.      Gait, balance, sensation and strength.      She has a Lhermitte sign.    Bladder function is fine.    She feels vision is ok and saw ophthalmology recently.    She has fatigue.    She has to wake up early (4 am) and gets about 6 hours sleep.    Adderall helps.   She takes the XR in the morning and a 10 mg IR later in the day.        She has had more migraines lately.    If she sleeps her migrain usually goes away.       Her brother has MS also (PPMS) and has difficulty walking.  Update 07/09/2018: She feels her MS is stable and she has no exacerbations or new symptoms.    She has not been on a DMT x 6 years and has done well.   Gait, balance, strength and sensation are doing well.   Dysethesias improved on Oxcabazepine and she was able to stop a month ago and they did  not return.  She has more fatigue, especially in the afternoons,   She is on Adderall XR 25 mg and takes at 530 am.  She is sleeping only 5-6 hours of sleep on work days.  Mood and cognition are doing well.  Migraines are doing well.  She notes more if sleep deprived but has done well since being on topiramate and imipramine 10   Update 11/03/2017: Her dysesthesias are doing better with Trileptal 300 mg po qAM and 600 mg po qPM.   The dysesthesias were neck down and sometimes triggered by getting out of bed.   Bending over also caused some spells.    However, since she started the Tecfidera she is feeling much more tired during the days.  She does note that the Adderall is helping some.  Her current dose is 15 mg time release in the morning.   We discussed changes in dose  She denies any change in her gait or balance.  Strength is normal.  Although she has a dysesthesias, she denies numbness.   Bladder function is doing well.  She denies any changes in fatigue or mood.  Migraines are doing  very well on Topamax and imipramine now only having 1-2 a month.    When one occurs Excedrin Migraine knocks it out.    Update 09/04/2017: She is noting episodes with a shock like sensation from her neck down lasting 10-20 seconds and associated with feeling weak.    Most of these spells occur a night when she changes positions in bed and only one occurred while sitting, but still with a change in position.   These started about 2-3 months ago and now occur 2-4 times every night.      She has been sleeping worse due to these symptoms.      She has been off of disease modifying therapy since about 2014 and had been on Betaseron for many years.  She was diagnosed around 58 after presenting with a cervical transverse myelitis and other evidence of MS.  She has had a Lhermitte sign off and on since diagnosis though the current symptoms seem to be different.   Her last cervical spine MRI was around 2010 and I  personally reviewed it showing several plaques in the upper cervical spine.  She notes no change in gait, strength, sensation or bladder function.   Vision is fine.     Her mood is doing okay though she does have more stress with her husband recently being diagnosed with cancer.  Update 04/10/2017: She had a sinus infection and URI concurrently.  She began to note vertigo and reduced balance.    With a scopolamine patch, she did better but vertigo returned.    She took 10-14 days of amoxacillin.    Then she had a UTI and was on a cephalosporin.    She then had knee surgery 2 days ago.    With all these issues, she has noted that the vertigo returns 1 day or two after stopping scopolamine. She does not note veering with walking and the vertigo is not clearly a spinning or translational feeling but more like motion sickness like in the back seat of a car.   Her ENT doctor did not feel that this was due to her ear/vestibular system but rather MS.     Her last exacerbation was at least 15 years ago.   Her last MRI was in 2016 and was stable.   She has not any disease modifying therapy.  She feels gait has ben stabvle otehrwise.   No new numbness, weakness or bladder change.   Vision is unchanged.      Fatigue has been doing ok, even with infections.    Sleep is good.    Mood has been stale.   Cognition is fine.      From 10/07/2016 MS:    She feels her MS has been stable. Her last exacerbation was more than 15 years ago. She had several exacerbations in the first 5 years of her disease.   Her last MRI in 2016 did not show any new lesions.  Headache:   She reports a HA with pain in the occiput and pressure in the forehead and eyes.  She notes more stress at work with more hours.    It has been constant x 10 days.   They were doing better adter starting Topamax at the last visit.    Gait/strength/sensation: She feels that her gait is normal. She is able to walk long distances without difficulty.  She can take steps with her eyes closed and not fall. The significant weakness in her arms or  legs. She will rarely get the Lhermite sign and/or tingling in the chest.  Bladder: She denies any significant problems with her bladder. She has no urgency, frequency or hesitancy. She has no nocturia.  Vision:   She denies any MS related vision problem.    She had optic neuritis shortly after diagnosis with complete recovery..  Fatigue/sleep: This is been changes in her work schedule with more hours, she is more tired. She also feels a little bit worse than she is in the heat. Sleep has been much worse the last month.   She is sleeping only 5-6 hours due to changes in her schedule and difficulty with sleep maintenance.     She'll have nocturia once some nights.  Mood/cognition: She denies any depression or anxiety. She notes mild cognitive issues.    As an example, she goes into a room and is not sure why she is there and has mild forgetfulness   She notes this seems worse associated with longer hours at work (55-60 hours).   Meningioma:   MRIs have shown a small meningioma. This has been stable to slightly increased over many years.  MS History:   She presented in 1994 with a Lhermite sign and tingling in her hands. An MRI of the cervical spine revealed a cervical plaque. She also had an MRI of the brain and a lumbar puncture at that time. The studies were consistent with multiple sclerosis.   She was placed on Betaseron but had a lot of difficulty tolerating the medication. Specifically she would get severe myalgias and fatigue. Therefore, she used the medication intermittently over the next 7 years or so. In 2000, she delivered a healthy daughter. A few months later, she became pregnant again but this pregnancy was complicated by adenomyosis and she required a hysterectomy. Afterwards, she had an exacerbation. Her symptoms at that time included a poor gait with a Romberg sign. She was placed on several  days of IV steroids and improved. Around 2001, she stopped Betaseron and has not been on any therapy since. Most of this time, she was seeing Dr. Jacqulynn Cadet. She last saw him in 2014.   I first saw her May 2016.      Review of studies:   MRI of the brain dated 08/18/2015 and compared it to the prior MRI. There are white matter lesions in a pattern and configuration consistent with multiple sclerosis. They are all located in the hemispheres and there are no brainstem or cerebellar lesions.   Additionally, there is a 1.2 cm right frontal meningioma that was unchanged.   MRI of the cervical spine12/31/2007 shows midline posterior plaque at C2 and left posterior plaque at C3.    She had another MRI of the brain 06/08/2012 compared to 2011 MRI shows a similar pattern and configuration of white matter foci. In the interval, there has only been the development of only 1 new small plaque in the right cerebral hemisphere. As before, there were no posterior fossa lesions. That study was done without contrast.  The meningioma looks about the same size. Chronic left maxillary and ethmoid sinusitis is noted.  FH:  Her brother (one year older) has primary progressive MS and has right hemiplegia.     REVIEW OF SYSTEMS: Out of a complete 14 system review of symptoms, the patient complains only of the following symptoms, fatigue, headaches and all other reviewed systems are negative.  ALLERGIES: Allergies  Allergen Reactions  . Prochlorperazine Edisylate Anaphylaxis  . Eggs  Or Egg-Derived Products Nausea And Vomiting  . Gadolinium Derivatives Nausea And Vomiting  . Morphine Itching    HOME MEDICATIONS: Outpatient Medications Prior to Visit  Medication Sig Dispense Refill  . amphetamine-dextroamphetamine (ADDERALL) 10 MG tablet TAKE 1 TABLET BY MOUTH IN THE AFTERNOON AS NEEDED 30 tablet 0  . cholecalciferol (VITAMIN D) 1000 units tablet Take 8,000 Units by mouth daily.     . Multiple Vitamin (MULTIVITAMIN) tablet  Take 1 tablet by mouth daily.    . ondansetron (ZOFRAN-ODT) 8 MG disintegrating tablet TAKE 1 TABLET BY MOUTH EVERY 8 HOURS AS NEEDED FOR NAUSEA OR VOMITING. 20 tablet 5  . topiramate (TOPAMAX) 50 MG tablet Take 2-3 po qHS 270 tablet 11  . amphetamine-dextroamphetamine (ADDERALL XR) 25 MG 24 hr capsule TAKE 1 CAPSULE BY MOUTH EVERY MORNING 30 capsule 0  . metoprolol succinate (TOPROL XL) 25 MG 24 hr tablet Take 1 tablet (25 mg total) by mouth daily. 90 tablet 3  . ALPRAZolam (XANAX) 0.25 MG tablet Take 1 tablet (0.25 mg total) by mouth 2 (two) times daily as needed for anxiety. 10 tablet 0  . doxycycline (VIBRA-TABS) 100 MG tablet Take 1 tablet (100 mg total) by mouth 2 (two) times daily. 14 tablet 0  . gabapentin (NEURONTIN) 300 MG capsule Take 300 mg by mouth daily.    Marland Kitchen gentamicin cream (GARAMYCIN) 0.1 % Apply 1 application topically 2 (two) times daily. 15 g 1  . meloxicam (MOBIC) 15 MG tablet Take 15 mg by mouth daily.    Marland Kitchen scopolamine (TRANSDERM-SCOP, 1.5 MG,) 1 MG/3DAYS Place 1 patch (1.5 mg total) onto the skin every 3 (three) days. 10 patch 0   No facility-administered medications prior to visit.    PAST MEDICAL HISTORY: Past Medical History:  Diagnosis Date  . Chondromalacia of left patella 10/2016  . Complication of anesthesia    slow to awaken after anesthesia for wisdom teeth extraction  . Family history of adverse reaction to anesthesia    pt's father has hx. of being hard to wake up post-op  . Lateral meniscus tear 10/2016   left  . Meningioma ([place])    right frontal  . Migraines   . Multiple sclerosis ([place])   . PONV (postoperative nausea and vomiting)   . PVC's (premature ventricular contractions)    no current med.    PAST SURGICAL HISTORY: Past Surgical History:  Procedure Laterality Date  . AUGMENTATION MAMMAPLASTY Bilateral    1994  . BREAST BIOPSY Right 08/14/2016  . BREAST ENHANCEMENT SURGERY    . CHONDROPLASTY Left 10/31/2016   Procedure: LEFT KNEE  CHONDROPLASTY;  Surgeon: Renette Butters, MD;  Location: [facility];  Service: Orthopedics;  Laterality: Left;  . ESOPHAGOGASTRODUODENOSCOPY (EGD) WITH PROPOFOL N/A 07/05/2015   Procedure: ESOPHAGOGASTRODUODENOSCOPY (EGD) WITH PROPOFOL;  Surgeon: Arta Silence, MD;  Location: [facility];  Service: Endoscopy;  Laterality: N/A;  . KNEE ARTHROSCOPY WITH LATERAL MENISECTOMY Left 10/31/2016   Procedure: LEFT KNEE ARTHROSCOPY WITH LATERAL MENISCECTOMY;  Surgeon: Renette Butters, MD;  Location: [facility];  Service: Orthopedics;  Laterality: Left;  . KNEE HARDWARE REMOVAL Right 11/30/2009   with debridement of scar tissue  . ORIF PROXIMAL TIBIAL PLATEAU FRACTURE Right 06/01/2009  . TOTAL VAGINAL HYSTERECTOMY  05/22/2000  . WISDOM TOOTH EXTRACTION      FAMILY HISTORY: Family History  Problem Relation Age of Onset  . Heart disease Paternal Grandfather   . Stomach cancer Paternal Grandmother   . Colon cancer  Paternal Grandmother   . Heart murmur Father   . Heart disease Father        TAVR, pacemaker  . Heart failure Father   . Anesthesia problems Father        hard to wake up post-op    SOCIAL HISTORY: Social History   Socioeconomic History  . Marital status: Married    Spouse name: Not on file  . Number of children: 2  . Years of education: Not on file  . Highest education level: Not on file  Occupational History  . Occupation: MRI Librarian, academic at [employer]: [employer]  Tobacco Use  . Smoking status: Never Smoker  . Smokeless tobacco: Never Used  Vaping Use  . Vaping Use: Never used  Substance and Sexual Activity  . Alcohol use: No    Alcohol/week: 0.0 standard drinks  . Drug use: No  . Sexual activity: Not on file  Other Topics Concern  . 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  Married.     Social Determinants of Health   Financial Resource Strain:   . Difficulty of Paying Living Expenses:   Food  Insecurity:   . Worried About Charity fundraiser in the Last Year:   . Arboriculturist in the Last Year:   Transportation Needs:   . Film/video editor (Medical):   Marland Kitchen Lack of Transportation (Non-Medical):   Physical Activity:   . Days of Exercise per Week:   . Minutes of Exercise per Session:   Stress:   . Feeling of Stress :   Social Connections:   . Frequency of Communication with Friends and Family:   . Frequency of Social Gatherings with Friends and Family:   . Attends Religious Services:   . Active Member of Clubs or Organizations:   . Attends Archivist Meetings:   Marland Kitchen Marital Status:   Intimate Partner Violence:   . Fear of Current or Ex-Partner:   . Emotionally Abused:   Marland Kitchen Physically Abused:   . Sexually Abused:       PHYSICAL EXAM  Vitals:   09/21/19 1521  BP: 111/65  Pulse: 74  Weight: 134 lb (60.8 kg)  Height: 5' 8.5" (1.74 m)   Body mass index is 20.08 kg/m.  Generalized: Well developed, in no acute distress  Cardiology: normal rate and rhythm, no murmur noted Respiratory: clear to auscultation bilaterally  Neurological examination  Mentation: Alert oriented to time, place, history taking. Follows all commands speech and language fluent Cranial nerve II-XII: Pupils were equal round reactive to light. Extraocular movements were full, visual field were full on confrontational test. Facial sensation and strength were normal. Uvula tongue midline. Head turning and shoulder shrug  were normal and symmetric. Motor: The motor testing reveals 5 over 5 strength of all 4 extremities. Good symmetric motor tone is noted throughout.  Sensory: Sensory testing is intact to soft touch on all 4 extremities. No evidence of extinction is noted.  Coordination: Cerebellar testing reveals good finger-nose-finger and heel-to-shin bilaterally.  Gait and station: Gait is normal.  Reflexes: Deep tendon reflexes are symmetric and normal bilaterally.   DIAGNOSTIC DATA  (LABS, IMAGING, TESTING) - I reviewed patient records, labs, notes, testing and imaging myself where available.  No flowsheet data found.   Lab Results  Component Value Date   WBC 7.6 11/04/2018   HGB 13.3 11/04/2018   HCT 40.3 11/04/2018  MCV 88.1 11/04/2018   PLT 368.0 11/04/2018      Component Value Date/Time   NA 139 11/04/2018 0927   K 4.6 11/04/2018 0927   CL 104 11/04/2018 0927   CO2 29 11/04/2018 0927   GLUCOSE 86 11/04/2018 0927   BUN 13 11/04/2018 0927   CREATININE 0.65 11/04/2018 0927   CALCIUM 9.6 11/04/2018 0927   PROT 6.7 11/04/2018 0927   ALBUMIN 4.2 11/04/2018 0927   AST 13 11/04/2018 0927   ALT 11 11/04/2018 0927   ALKPHOS 54 11/04/2018 0927   BILITOT 0.5 11/04/2018 0927   GFRNONAA 109.73 02/21/2010 1302   GFRAA  05/31/2009 1000    >60        The eGFR has been calculated using the MDRD equation. This calculation has not been validated in all clinical situations. eGFR's persistently <60 mL/min signify possible Chronic Kidney Disease.   Lab Results  Component Value Date   CHOL 166 11/04/2018   HDL 48.20 11/04/2018   LDLCALC 101 (H) 11/04/2018   TRIG 84.0 11/04/2018   CHOLHDL 3 11/04/2018   No results found for: HGBA1C No results found for: VITAMINB12 Lab Results  Component Value Date   TSH 1.35 11/04/2018       ASSESSMENT AND PLAN 49 y.o. year old female  has a past medical history of Chondromalacia of left patella (34/1937), Complication of anesthesia, Family history of adverse reaction to anesthesia, Lateral meniscus tear (10/2016), Meningioma ([place]), Migraines, Multiple sclerosis ([place]), PONV (postoperative nausea and vomiting), and PVC's (premature ventricular contractions). here with     ICD-10-CM   1. Multiple sclerosis ([place])  G35 CMP    Vitamin D, 25-hydroxy    CBC with Differential/Platelets    amphetamine-dextroamphetamine (ADDERALL XR) 30 MG 24 hr capsule  2. Common migraine without intractability  G43.009   3. Other fatigue   R53.83   4. Vitamin D deficiency  E55.9     Dorthula is doing very well. No new or exacerbating symptoms. MS has been stable off DMT for many years. We will update labs today. I will increase Adderall XR to 63m daily to see if this helps with fatigue. May discontinue IR dosing if not needed. She was encouraged to continue topiramate and vit d supplements. She will continue healthy lifestyle habits. Follow up in 6-8 months advised. She verbalizes understanding and agreement with this plan.    Orders Placed This Encounter  Procedures  . CMP  . Vitamin D, 25-hydroxy  . CBC with Differential/Platelets     Meds ordered this encounter  Medications  . amphetamine-dextroamphetamine (ADDERALL XR) 30 MG 24 hr capsule    Sig: Take 1 capsule (30 mg total) by mouth daily.    Dispense:  30 capsule    Refill:  0    Order Specific Question:   Supervising Provider    Answer:   AMelvenia Beam[[id number]     I spent 15 minutes with the patient. 50% of this time was spent counseling and educating patient on plan of care and medications.    ADebbora Presto FNP-C 09/21/2019, 4:14 PM [facility] [address] [place] [phone]

## 2019-09-21 NOTE — Patient Instructions (Addendum)
We will try Adderall XR 30mg  daily to see if this eliminates need for extra dose and helps with fatigue. Take Adderall XR 10mg  IR in the afternoon as needed. Continue topiramate for headaches.   Follow up in 6 months, sooner if needed   Multiple Sclerosis Multiple sclerosis (MS) is a disease of the brain, spinal cord, and optic nerves (central nervous system). It causes the body's disease-fighting (immune) system to destroy the protective covering (myelin sheath) around nerves in the brain. When this happens, signals (nerve impulses) going to and from the brain and spinal cord do not get sent properly or may not get sent at all. There are several types of MS:  Relapsing-remitting MS. This is the most common type. This causes sudden attacks of symptoms. After an attack, you may recover completely until the next attack, or some symptoms may remain permanently.  Secondary progressive MS. This usually develops after the onset of relapsing-remitting MS. Similar to relapsing-remitting MS, this type also causes sudden attacks of symptoms. Attacks may be less frequent, but symptoms slowly get worse (progress) over time.  Primary progressive MS. This causes symptoms that steadily progress over time. This type of MS does not cause sudden attacks of symptoms. The age of onset of MS varies, but it often develops between 47-79 years of age. MS is a lifelong (chronic) condition. There is no cure, but treatment can help slow down the progression of the disease. What are the causes? The cause of this condition is not known. What increases the risk? You are more likely to develop this condition if:  You are a woman.  You have a relative with MS. However, the condition is not passed from parent to child (inherited).  You have a lack (deficiency) of vitamin D.  You smoke. MS is more common in the [place] than in the [place]. What are the signs or symptoms? Relapsing-remitting  and secondary progressive MS cause symptoms to occur in episodes or attacks that may last weeks to months. There may be long periods between attacks in which there are almost no symptoms. Primary progressive MS causes symptoms to steadily progress after they develop. Symptoms of MS vary because of the many different ways it affects the central nervous system. The main symptoms include:  Vision problems and eye pain.  Numbness.  Weakness.  Inability to move your arms, hands, feet, or legs (paralysis).  Balance problems.  Shaking that you cannot control (tremors).  Muscle spasms.  Problems with thinking (cognitive changes). MS can also cause symptoms that are associated with the disease, but are not always the direct result of an MS attack. They may include:  Inability to control urination or bowel movements (incontinence).  Headaches.  Fatigue.  Inability to tolerate heat.  Emotional changes.  Depression.  Pain. How is this diagnosed? This condition is diagnosed based on:  Your symptoms.  A neurological exam. This involves checking central nervous system function, such as nerve function, reflexes, and coordination.  MRIs of the brain and spinal cord.  Lab tests, including a lumbar puncture that tests the fluid that surrounds the brain and spinal cord (cerebrospinal fluid).  Tests to measure the electrical activity of the brain in response to stimulation (evoked potentials). How is this treated? There is no cure for MS, but medicines can help decrease the number and frequency of attacks and help relieve nuisance symptoms. Treatment options may include:  Medicines that reduce the frequency of attacks. These medicines may be  given by injection, by mouth (orally), or through an IV.  Medicines that reduce inflammation (steroids). These may provide short-term relief of symptoms.  Medicines to help control pain, depression, fatigue, or incontinence.  Vitamin D, if you  have a deficiency.  Using devices to help you move around (assistive devices), such as braces, a cane, or a walker.  Physical therapy to strengthen and stretch your muscles.  Occupational therapy to help you with everyday tasks.  Alternative or complementary treatments such as exercise, massage, or acupuncture. Follow these instructions at home:  Take over-the-counter and prescription medicines only as told by your health care provider.  Do not drive or use heavy machinery while taking prescription pain medicine.  Use assistive devices as recommended by your physical therapist or your health care provider.  Exercise as directed by your health care provider.  Return to your normal activities as told by your health care provider. Ask your health care provider what activities are safe for you.  Reach out for support. Share your feelings with friends, family, or a support group.  Keep all follow-up visits as told by your health care provider and therapists. This is important. Where to find more information  National Multiple Sclerosis Society: https://www.nationalmssociety.org Contact a health care provider if:  You feel depressed.  You develop new pain or numbness.  You have tremors.  You have problems with sexual function. Get help right away if:  You develop paralysis.  You develop numbness.  You have problems with your bladder or bowel function.  You develop double vision.  You lose vision in one or both eyes.  You develop suicidal thoughts.  You develop severe confusion. If you ever feel like you may hurt yourself or others, or have thoughts about taking your own life, get help right away. You can go to your nearest emergency department or call:  Your local emergency services (911 in the [place]  A suicide crisis helpline, such as the [facility] at 484-635-3734. This is open 24 hours a day. Summary  Multiple sclerosis (MS) is a  disease of the central nervous system that causes the body's immune system to destroy the protective covering (myelin sheath) around nerves in the brain.  There are 3 types of MS: relapsing-remitting, secondary progressive, and primary progressive. Relapsing-remitting and secondary progressive MS cause symptoms to occur in episodes or attacks that may last weeks to months. Primary progressive MS causes symptoms to steadily progress after they develop.  There is no cure for MS, but medicines can help decrease the number and frequency of attacks and help relieve nuisance symptoms. Treatment may also include physical or occupational therapy.  If you develop numbness, paralysis, vision problems, or other neurological symptoms, get help right away. This information is not intended to replace advice given to you by your health care provider. Make sure you discuss any questions you have with your health care provider. Document Revised: 02/21/2017 Document Reviewed: 05/20/2016 Elsevier Patient Education  2020 [employer].

## 2019-09-22 NOTE — Progress Notes (Signed)
I have read the note, and I agree with the clinical assessment and plan.  Eiko Mcgowen A. Russell Engelstad, MD, PhD, FAAN Certified in Neurology, Clinical Neurophysiology, Sleep Medicine, Pain Medicine and Neuroimaging  [facility] [address], [address] [place], [place] [address] [phone]  

## 2019-09-23 MED FILL — ADDERALL XR 30 MG CAP SA: 30 | 30 days supply | Qty: 30 | Fill #0

## 2019-10-05 DIAGNOSIS — M1712 Unilateral primary osteoarthritis, left knee: Secondary | ICD-10-CM | POA: Diagnosis not present

## 2019-10-05 DIAGNOSIS — M25562 Pain in left knee: Secondary | ICD-10-CM | POA: Diagnosis not present

## 2019-10-12 DIAGNOSIS — M1712 Unilateral primary osteoarthritis, left knee: Secondary | ICD-10-CM | POA: Diagnosis not present

## 2019-10-19 DIAGNOSIS — M1712 Unilateral primary osteoarthritis, left knee: Secondary | ICD-10-CM | POA: Diagnosis not present

## 2019-11-01 ENCOUNTER — Encounter: Payer: 59 | Admitting: Internal Medicine

## 2019-11-03 ENCOUNTER — Encounter: Payer: Self-pay | Admitting: Internal Medicine

## 2019-11-03 ENCOUNTER — Telehealth: Payer: Self-pay | Admitting: *Deleted

## 2019-11-03 NOTE — Telephone Encounter (Signed)
-----   Message from Debbora Presto, NP sent at 09/28/2019  7:39 AM EDT ----- Good morning. Can you check to see if these labs were performed? TY! ----- Message ----- From: SYSTEM Sent: 09/26/2019  12:15 AM EDT To: Debbora Presto, NP

## 2019-11-04 ENCOUNTER — Other Ambulatory Visit (INDEPENDENT_AMBULATORY_CARE_PROVIDER_SITE_OTHER): Payer: 59

## 2019-11-04 DIAGNOSIS — Z0289 Encounter for other administrative examinations: Secondary | ICD-10-CM

## 2019-11-04 DIAGNOSIS — G35 Multiple sclerosis: Secondary | ICD-10-CM | POA: Diagnosis not present

## 2019-11-04 NOTE — Telephone Encounter (Signed)
Larena Glassman, hold until we receive form.   Dr Nicki Reaper

## 2019-11-05 LAB — COMPREHENSIVE METABOLIC PANEL
ALT: 11 IU/L (ref 0–32)
AST: 15 IU/L (ref 0–40)
Albumin/Globulin Ratio: 1.7 (ref 1.2–2.2)
Albumin: 4.3 g/dL (ref 3.8–4.8)
Alkaline Phosphatase: 71 IU/L (ref 48–121)
BUN/Creatinine Ratio: 29 — ABNORMAL HIGH (ref 9–23)
BUN: 17 mg/dL (ref 6–24)
Bilirubin Total: <0.2 mg/dL (ref 0.0–1.2)
CO2: 27 mmol/L (ref 20–29)
Calcium: 9.3 mg/dL (ref 8.7–10.2)
Chloride: 103 mmol/L (ref 96–106)
Creatinine, Ser: 0.59 mg/dL (ref 0.57–1.00)
GFR calc Af Amer: 125 mL/min/{1.73_m2} (ref >59)
GFR calc non Af Amer: 109 mL/min/{1.73_m2} (ref >59)
Globulin, Total: 2.6 g/dL (ref 1.5–4.5)
Glucose: 85 mg/dL (ref 65–99)
Potassium: 4.6 mmol/L (ref 3.5–5.2)
Sodium: 139 mmol/L (ref 134–144)
Total Protein: 6.9 g/dL (ref 6.0–8.5)

## 2019-11-05 LAB — CBC WITH DIFFERENTIAL/PLATELET
Basophils Absolute: 0.1 10*3/uL (ref 0.0–0.2)
Basos: 1 %
EOS (ABSOLUTE): 0.4 10*3/uL (ref 0.0–0.4)
Eos: 4 %
Hematocrit: 38.7 % (ref 34.0–46.6)
Hemoglobin: 12.7 g/dL (ref 11.1–15.9)
Immature Grans (Abs): 0 10*3/uL (ref 0.0–0.1)
Immature Granulocytes: 0 %
Lymphocytes Absolute: 3.3 10*3/uL — ABNORMAL HIGH (ref 0.7–3.1)
Lymphs: 36 %
MCH: 28.8 pg (ref 26.6–33.0)
MCHC: 32.8 g/dL (ref 31.5–35.7)
MCV: 88 fL (ref 79–97)
Monocytes Absolute: 1 10*3/uL — ABNORMAL HIGH (ref 0.1–0.9)
Monocytes: 11 %
Neutrophils Absolute: 4.3 10*3/uL (ref 1.4–7.0)
Neutrophils: 48 %
Platelets: 344 10*3/uL (ref 150–450)
RBC: 4.41 x10E6/uL (ref 3.77–5.28)
RDW: 12 % (ref 11.7–15.4)
WBC: 9.1 10*3/uL (ref 3.4–10.8)

## 2019-11-05 LAB — VITAMIN D 25 HYDROXY (VIT D DEFICIENCY, FRACTURES): Vit D, 25-Hydroxy: 51.6 ng/mL (ref 30.0–100.0)

## 2019-11-08 ENCOUNTER — Encounter: Payer: Self-pay | Admitting: Family Medicine

## 2019-11-08 NOTE — Telephone Encounter (Signed)
Pt dropped off forms. Placed in folder up front to be completed. Please call pt when complete

## 2019-11-09 ENCOUNTER — Other Ambulatory Visit: Payer: Self-pay | Admitting: *Deleted

## 2019-11-09 DIAGNOSIS — G35 Multiple sclerosis: Secondary | ICD-10-CM

## 2019-11-09 NOTE — Telephone Encounter (Signed)
I do not have the forms.  Need to form to complete.

## 2019-11-09 NOTE — Telephone Encounter (Signed)
Forms placed in form folder for completion

## 2019-11-10 MED ORDER — AMPHETAMINE-DEXTROAMPHET ER 30 MG PO CP24: 30.0000 mg | ORAL_CAPSULE | Freq: Every day | ORAL | 0 refills | Status: DC

## 2019-11-10 MED FILL — ADDERALL XR 30 MG CAP SA: 30 | 30 days supply | Qty: 30 | Fill #0

## 2019-11-10 NOTE — Telephone Encounter (Signed)
I have signed the form.  There is one page that needs her signature.

## 2019-11-25 MED FILL — ONDANSETRON ODT 8 MG TABLET: 8 | 7 days supply | Qty: 20 | Fill #1

## 2019-12-02 DIAGNOSIS — M1712 Unilateral primary osteoarthritis, left knee: Secondary | ICD-10-CM | POA: Diagnosis not present

## 2019-12-13 DIAGNOSIS — M25662 Stiffness of left knee, not elsewhere classified: Secondary | ICD-10-CM | POA: Diagnosis not present

## 2019-12-13 DIAGNOSIS — M25562 Pain in left knee: Secondary | ICD-10-CM | POA: Diagnosis not present

## 2019-12-15 DIAGNOSIS — M25662 Stiffness of left knee, not elsewhere classified: Secondary | ICD-10-CM | POA: Diagnosis not present

## 2019-12-15 DIAGNOSIS — M25562 Pain in left knee: Secondary | ICD-10-CM | POA: Diagnosis not present

## 2019-12-17 DIAGNOSIS — M25662 Stiffness of left knee, not elsewhere classified: Secondary | ICD-10-CM | POA: Diagnosis not present

## 2019-12-17 DIAGNOSIS — M25562 Pain in left knee: Secondary | ICD-10-CM | POA: Diagnosis not present

## 2019-12-20 ENCOUNTER — Other Ambulatory Visit: Payer: Self-pay | Admitting: Family Medicine

## 2019-12-20 DIAGNOSIS — M25562 Pain in left knee: Secondary | ICD-10-CM | POA: Diagnosis not present

## 2019-12-20 DIAGNOSIS — M25662 Stiffness of left knee, not elsewhere classified: Secondary | ICD-10-CM | POA: Diagnosis not present

## 2019-12-20 DIAGNOSIS — G35 Multiple sclerosis: Secondary | ICD-10-CM

## 2019-12-20 MED ORDER — AMPHETAMINE-DEXTROAMPHET ER 30 MG PO CP24: 30.0000 mg | ORAL_CAPSULE | Freq: Every day | ORAL | 0 refills | Status: DC

## 2019-12-20 MED FILL — ADDERALL XR 30 MG CAP SA: 30 | 30 days supply | Qty: 30 | Fill #0

## 2019-12-22 DIAGNOSIS — M25562 Pain in left knee: Secondary | ICD-10-CM | POA: Diagnosis not present

## 2019-12-22 DIAGNOSIS — M25662 Stiffness of left knee, not elsewhere classified: Secondary | ICD-10-CM | POA: Diagnosis not present

## 2019-12-24 DIAGNOSIS — M25562 Pain in left knee: Secondary | ICD-10-CM | POA: Diagnosis not present

## 2019-12-24 DIAGNOSIS — M25662 Stiffness of left knee, not elsewhere classified: Secondary | ICD-10-CM | POA: Diagnosis not present

## 2019-12-29 DIAGNOSIS — M25562 Pain in left knee: Secondary | ICD-10-CM | POA: Diagnosis not present

## 2019-12-29 DIAGNOSIS — M25662 Stiffness of left knee, not elsewhere classified: Secondary | ICD-10-CM | POA: Diagnosis not present

## 2019-12-31 DIAGNOSIS — M25562 Pain in left knee: Secondary | ICD-10-CM | POA: Diagnosis not present

## 2019-12-31 DIAGNOSIS — M25662 Stiffness of left knee, not elsewhere classified: Secondary | ICD-10-CM | POA: Diagnosis not present

## 2020-01-03 DIAGNOSIS — M25562 Pain in left knee: Secondary | ICD-10-CM | POA: Diagnosis not present

## 2020-01-03 DIAGNOSIS — M25662 Stiffness of left knee, not elsewhere classified: Secondary | ICD-10-CM | POA: Diagnosis not present

## 2020-01-06 ENCOUNTER — Other Ambulatory Visit (HOSPITAL_COMMUNITY): Payer: Self-pay | Admitting: Orthopedic Surgery

## 2020-01-06 ENCOUNTER — Other Ambulatory Visit: Payer: Self-pay | Admitting: Orthopedic Surgery

## 2020-01-06 DIAGNOSIS — M25562 Pain in left knee: Secondary | ICD-10-CM

## 2020-01-06 DIAGNOSIS — M1712 Unilateral primary osteoarthritis, left knee: Secondary | ICD-10-CM | POA: Diagnosis not present

## 2020-01-10 DIAGNOSIS — M25562 Pain in left knee: Secondary | ICD-10-CM | POA: Diagnosis not present

## 2020-01-10 DIAGNOSIS — M25662 Stiffness of left knee, not elsewhere classified: Secondary | ICD-10-CM | POA: Diagnosis not present

## 2020-01-12 DIAGNOSIS — M25562 Pain in left knee: Secondary | ICD-10-CM | POA: Diagnosis not present

## 2020-01-12 DIAGNOSIS — M25662 Stiffness of left knee, not elsewhere classified: Secondary | ICD-10-CM | POA: Diagnosis not present

## 2020-01-13 ENCOUNTER — Other Ambulatory Visit: Payer: Self-pay

## 2020-01-13 ENCOUNTER — Ambulatory Visit (HOSPITAL_COMMUNITY)
Admission: RE | Admit: 2020-01-13 | Discharge: 2020-01-13 | Disposition: A | Discharge status: Home / Self Care | Payer: 59 | Source: Ambulatory Visit | Attending: Orthopedic Surgery | Admitting: Orthopedic Surgery

## 2020-01-13 DIAGNOSIS — M1712 Unilateral primary osteoarthritis, left knee: Secondary | ICD-10-CM | POA: Diagnosis not present

## 2020-01-13 DIAGNOSIS — M25462 Effusion, left knee: Secondary | ICD-10-CM | POA: Diagnosis not present

## 2020-01-13 DIAGNOSIS — M25562 Pain in left knee: Secondary | ICD-10-CM | POA: Insufficient documentation

## 2020-01-13 DIAGNOSIS — R600 Localized edema: Secondary | ICD-10-CM | POA: Diagnosis not present

## 2020-01-13 DIAGNOSIS — M65862 Other synovitis and tenosynovitis, left lower leg: Secondary | ICD-10-CM | POA: Diagnosis not present

## 2020-01-14 DIAGNOSIS — M25562 Pain in left knee: Secondary | ICD-10-CM | POA: Diagnosis not present

## 2020-01-14 DIAGNOSIS — M25662 Stiffness of left knee, not elsewhere classified: Secondary | ICD-10-CM | POA: Diagnosis not present

## 2020-01-20 ENCOUNTER — Other Ambulatory Visit: Payer: Self-pay

## 2020-01-21 DIAGNOSIS — M25662 Stiffness of left knee, not elsewhere classified: Secondary | ICD-10-CM | POA: Diagnosis not present

## 2020-01-21 DIAGNOSIS — M25562 Pain in left knee: Secondary | ICD-10-CM | POA: Diagnosis not present

## 2020-01-24 DIAGNOSIS — M25662 Stiffness of left knee, not elsewhere classified: Secondary | ICD-10-CM | POA: Diagnosis not present

## 2020-01-24 DIAGNOSIS — M25562 Pain in left knee: Secondary | ICD-10-CM | POA: Diagnosis not present

## 2020-01-25 ENCOUNTER — Encounter: Payer: 59 | Admitting: Internal Medicine

## 2020-02-01 DIAGNOSIS — M25662 Stiffness of left knee, not elsewhere classified: Secondary | ICD-10-CM | POA: Diagnosis not present

## 2020-02-01 DIAGNOSIS — M25562 Pain in left knee: Secondary | ICD-10-CM | POA: Diagnosis not present

## 2020-02-02 DIAGNOSIS — M1712 Unilateral primary osteoarthritis, left knee: Secondary | ICD-10-CM | POA: Diagnosis not present

## 2020-02-02 MED FILL — ONDANSETRON ODT 8 MG TABLET: 8 | 7 days supply | Qty: 20 | Fill #2

## 2020-02-05 ENCOUNTER — Other Ambulatory Visit: Payer: Self-pay | Admitting: Neurology

## 2020-02-05 DIAGNOSIS — G35 Multiple sclerosis: Secondary | ICD-10-CM

## 2020-02-05 MED ORDER — AMPHETAMINE-DEXTROAMPHET ER 30 MG PO CP24: 30.0000 mg | ORAL_CAPSULE | Freq: Every day | ORAL | 0 refills | Status: DC

## 2020-02-05 MED FILL — ADDERALL XR 30 MG CAP SA: 30 | 30 days supply | Qty: 30 | Fill #0

## 2020-02-08 DIAGNOSIS — M25662 Stiffness of left knee, not elsewhere classified: Secondary | ICD-10-CM | POA: Diagnosis not present

## 2020-02-08 DIAGNOSIS — M25562 Pain in left knee: Secondary | ICD-10-CM | POA: Diagnosis not present

## 2020-02-10 MED FILL — TOPIRAMATE 50 MG TABLET: 50 | 90 days supply | Qty: 270 | Fill #1

## 2020-02-11 ENCOUNTER — Encounter: Payer: 59 | Admitting: Internal Medicine

## 2020-02-22 DIAGNOSIS — M25662 Stiffness of left knee, not elsewhere classified: Secondary | ICD-10-CM | POA: Diagnosis not present

## 2020-02-22 DIAGNOSIS — M25562 Pain in left knee: Secondary | ICD-10-CM | POA: Diagnosis not present

## 2020-03-02 DIAGNOSIS — M25562 Pain in left knee: Secondary | ICD-10-CM | POA: Diagnosis not present

## 2020-03-02 DIAGNOSIS — M25662 Stiffness of left knee, not elsewhere classified: Secondary | ICD-10-CM | POA: Diagnosis not present

## 2020-03-07 DIAGNOSIS — M25662 Stiffness of left knee, not elsewhere classified: Secondary | ICD-10-CM | POA: Diagnosis not present

## 2020-03-07 DIAGNOSIS — M25562 Pain in left knee: Secondary | ICD-10-CM | POA: Diagnosis not present

## 2020-03-09 ENCOUNTER — Other Ambulatory Visit: Payer: Self-pay | Admitting: Neurology

## 2020-03-09 ENCOUNTER — Other Ambulatory Visit: Payer: Self-pay | Admitting: Family Medicine

## 2020-03-09 DIAGNOSIS — G35 Multiple sclerosis: Secondary | ICD-10-CM

## 2020-03-09 MED FILL — ADDERALL XR 30 MG CAP SA: 30 | 30 days supply | Qty: 30 | Fill #0

## 2020-03-13 DIAGNOSIS — M25562 Pain in left knee: Secondary | ICD-10-CM | POA: Diagnosis not present

## 2020-03-13 DIAGNOSIS — M25662 Stiffness of left knee, not elsewhere classified: Secondary | ICD-10-CM | POA: Diagnosis not present

## 2020-03-29 ENCOUNTER — Encounter: Payer: Self-pay | Admitting: Neurology

## 2020-03-29 ENCOUNTER — Ambulatory Visit: Payer: 59 | Admitting: Neurology

## 2020-03-29 ENCOUNTER — Other Ambulatory Visit: Payer: Self-pay | Admitting: Neurology

## 2020-03-29 VITALS — BP 110/66 | HR 76 | Ht 68.5 in | Wt 135.4 lb

## 2020-03-29 DIAGNOSIS — R5383 Other fatigue: Secondary | ICD-10-CM | POA: Diagnosis not present

## 2020-03-29 DIAGNOSIS — G4700 Insomnia, unspecified: Secondary | ICD-10-CM

## 2020-03-29 DIAGNOSIS — G43009 Migraine without aura, not intractable, without status migrainosus: Secondary | ICD-10-CM | POA: Diagnosis not present

## 2020-03-29 DIAGNOSIS — R202 Paresthesia of skin: Secondary | ICD-10-CM

## 2020-03-29 DIAGNOSIS — G35 Multiple sclerosis: Secondary | ICD-10-CM

## 2020-03-29 MED ORDER — TOPIRAMATE 50 MG PO TABS: ORAL_TABLET | ORAL | 4 refills | Status: DC

## 2020-03-29 MED ORDER — AMPHETAMINE-DEXTROAMPHETAMINE 10 MG PO TABS: ORAL_TABLET | ORAL | 0 refills | Status: DC

## 2020-03-29 MED ORDER — AMPHETAMINE-DEXTROAMPHET ER 30 MG PO CP24: 30.0000 mg | ORAL_CAPSULE | Freq: Every day | ORAL | 0 refills | Status: DC

## 2020-03-29 MED ORDER — GABAPENTIN 300 MG PO CAPS: ORAL_CAPSULE | ORAL | 3 refills | Status: DC

## 2020-03-29 MED ORDER — RIZATRIPTAN BENZOATE 10 MG PO TBDP: 10.0000 mg | ORAL_TABLET | ORAL | 11 refills | Status: DC | PRN

## 2020-03-29 MED FILL — AMPHETAMINE SALTS 10 MG: 10 | 30 days supply | Qty: 30 | Fill #0

## 2020-03-29 MED FILL — RIZATRIPTAN 10 MG ODT: 10 | 30 days supply | Qty: 9 | Fill #0

## 2020-03-29 MED FILL — GABAPENTIN 300 MG CAPSULE: 300 | 90 days supply | Qty: 180 | Fill #0

## 2020-03-29 NOTE — Progress Notes (Signed)
[facility]  PATIENT: Ashley May DOB: 11/19/1970  REFERRING DOCTOR OR PCP:  Dale [place] SOURCE: patient, records in EMR and MRI images on PACS/CD  _________________________________   HISTORICAL  CHIEF COMPLAINT:  Chief Complaint  Patient presents with  . Follow-up    RM 13, alone. Last seen 09/21/19 by AL,NP. Off DMT for MS.     HISTORY OF PRESENT ILLNESS:  Ashley May is a 50 y.o. woman with relapsing remitting multiple sclerosis.   Update 03/29/2020: She feels her MS is stable and she has no exacerbations or new symptoms.    She stopped DMTs around 2005 and has done well.   Gait, balance, strength and sensation are doing well.   Dysethesias contineu to do well.   They improved after starting oxcabazepine and she was able to stop a last year without return of symptoms.    She has a lot of fatigue, especially in the afternoons,   She needs to wake up early to be at the hospital on time.  She is on Adderall XR 30 mg at 530 am and 10 mg IR in the afternoons.  She is sleeping 5 to 6  hours of sleep on work most nights but she notes she tosses and turns a lot.   She falls asleep easily but wakes up a couple times. Some nights she may only get 5 hours of sleep. She also notes possible restless leg syndrome.   Mood and cognition are doing well.  Migraines are doing well.  She notes more if sleep deprived but has done well since being on topiramate. She has not tried Imitrex or other triptan.  MS History: MS History:   She presented in 1994 with a [place] sign and tingling in her hands. An MRI of the cervical spine revealed a cervical plaque. She also had an MRI of the brain and a lumbar puncture at that time. The studies were consistent with multiple sclerosis.   She was placed on Betaseron but had a lot of difficulty tolerating the medication. Specifically she would get severe myalgias and fatigue. Therefore, she used the medication intermittently over the  next 7 years or so. In 2000, she delivered a healthy daughter. A few months later, she became pregnant again but this pregnancy was complicated by adenomyosis and she required a hysterectomy. Afterwards, she had an exacerbation. Her symptoms at that time included a poor gait with a Romberg sign. She was placed on several days of IV steroids and improved. Around 2001, she stopped Betaseron and has not been on any therapy since. Most of this time, she was seeing Dr. Leotis Shames. She last saw him in 2014.   I first saw her May 2016.      FH:  Her brother (one year older) has primary progressive MS and has right hemiplegia.    Imaging studies: MRI 03/29/2020:  Scattered T2/FLAIR hyperintense foci in the periventricular, juxtacortical deep white matter of the hemispheres and upper cervical spinal cord consistent with chronic demyelinating plaque associated with multiple sclerosis. None of the foci were new compared to the 2019 MRI. Additionally, there is a 1.2 cm right frontal meningioma that appears stable  MRI brain 04/15/2017:  Stable appearance of demyelinating lesions involvement of the corpus callosum.    Fluid level of the left maxillary sinus compatible with acute sinusitis. The patient has had left maxillary sinusitis on previous  studies as well.   Stable right frontal meningiomas  MRI cervical spine 09/05/2017 showed  chronic cervical spinal cord lesions at C2 and C3,  Stable since 2007.  There is perhaps a new small left far lateral cord lesion at C4-C5, but no other discrete cord lesions and no other progression of demyelinating disease since 2007.   Mild for age cervical spine degeneration. Mild C6-C7 disc and endplate degeneration has developed since 2007 with borderline to mild bilateral C7 foraminal stenosis.  MRI of the brain 08/18/2015 and compared it to the prior MRI. There are white matter lesions in a pattern and configuration consistent with multiple sclerosis. They are all located in the  hemispheres and there are no brainstem or cerebellar lesions.   Additionally, there is a 1.2 cm right frontal meningioma that was unchanged.   MRI of the cervical spine12/31/2007 shows midline posterior plaque at C2 and left posterior plaque at C3.    She had another MRI of the brain 06/08/2012 compared to 2011 MRI shows a similar pattern and configuration of white matter foci. In the interval, there has only been the development of only 1 new small plaque in the right cerebral hemisphere. As before, there were no posterior fossa lesions. That study was done without contrast.  The meningioma looks about the same size. Chronic left maxillary and ethmoid sinusitis is noted.   REVIEW OF SYSTEMS: Constitutional: No fevers, chills, sweats, or change in appetite.   She notes some fatigue and sleepiness. Eyes: No visual changes, double vision, eye pain Ear, nose and throat: No hearing loss, ear pain, nasal congestion, sore throat Cardiovascular: No chest pain, palpitations Respiratory: No shortness of breath at rest or with exertion.   No wheezes GastrointestinaI: No nausea, vomiting, diarrhea, abdominal pain, fecal incontinence Genitourinary: No dysuria, urinary retention or frequency.  No nocturia. Musculoskeletal: No neck pain, back pain Integumentary: No rash, pruritus, skin lesions Neurological: as above Psychiatric: No depression at this time.  No anxiety Endocrine: No palpitations, diaphoresis, change in appetite, change in weigh or increased thirst Hematologic/Lymphatic: No anemia, purpura, petechiae. Allergic/Immunologic: No itchy/runny eyes, nasal congestion, recent allergic reactions, rashes  ALLERGIES: Allergies  Allergen Reactions  . Prochlorperazine Edisylate Anaphylaxis  . Eggs Or Egg-Derived Products Nausea And Vomiting  . Gadolinium Derivatives Nausea And Vomiting  . Morphine Itching    HOME MEDICATIONS:  Current Outpatient Medications:  .  cholecalciferol (VITAMIN D)  1000 units tablet, Take 8,000 Units by mouth daily. , Disp: , Rfl:  .  gabapentin (NEURONTIN) 300 MG capsule, Take one or two at bedtime, Disp: 180 capsule, Rfl: 3 .  Multiple Vitamin (MULTIVITAMIN) tablet, Take 1 tablet by mouth daily., Disp: , Rfl:  .  ondansetron (ZOFRAN-ODT) 8 MG disintegrating tablet, TAKE 1 TABLET BY MOUTH EVERY 8 HOURS AS NEEDED FOR NAUSEA OR VOMITING., Disp: 20 tablet, Rfl: 5 .  rizatriptan (MAXALT-MLT) 10 MG disintegrating tablet, Take 1 tablet (10 mg total) by mouth as needed for migraine. May repeat in 2 hours if needed, Disp: 9 tablet, Rfl: 11 .  amphetamine-dextroamphetamine (ADDERALL XR) 30 MG 24 hr capsule, Take 1 capsule (30 mg total) by mouth daily., Disp: 30 capsule, Rfl: 0 .  amphetamine-dextroamphetamine (ADDERALL) 10 MG tablet, TAKE 1 TABLET BY MOUTH IN THE AFTERNOON AS NEEDED, Disp: 30 tablet, Rfl: 0 .  topiramate (TOPAMAX) 50 MG tablet, Take 2-3 po qHS, Disp: 270 tablet, Rfl: 4  PAST MEDICAL HISTORY: Past Medical History:  Diagnosis Date  . Chondromalacia of left patella 10/2016  . Complication of anesthesia    slow to awaken after anesthesia for wisdom  teeth extraction  . Family history of adverse reaction to anesthesia    pt's father has hx. of being hard to wake up post-op  . Lateral meniscus tear 10/2016   left  . Meningioma (HCC)    right frontal  . Migraines   . Multiple sclerosis (HCC)   . PONV (postoperative nausea and vomiting)   . PVC's (premature ventricular contractions)    no current med.    PAST SURGICAL HISTORY: Past Surgical History:  Procedure Laterality Date  . AUGMENTATION MAMMAPLASTY Bilateral    1994  . BREAST BIOPSY Right 08/14/2016  . BREAST ENHANCEMENT SURGERY    . CHONDROPLASTY Left 10/31/2016   Procedure: LEFT KNEE CHONDROPLASTY;  Surgeon: Sheral Apley, MD;  Location: [facility];  Service: Orthopedics;  Laterality: Left;  . ESOPHAGOGASTRODUODENOSCOPY (EGD) WITH PROPOFOL N/A 07/05/2015   Procedure:  ESOPHAGOGASTRODUODENOSCOPY (EGD) WITH PROPOFOL;  Surgeon: Willis Modena, MD;  Location: [facility];  Service: Endoscopy;  Laterality: N/A;  . KNEE ARTHROSCOPY WITH LATERAL MENISECTOMY Left 10/31/2016   Procedure: LEFT KNEE ARTHROSCOPY WITH LATERAL MENISCECTOMY;  Surgeon: Sheral Apley, MD;  Location: [facility];  Service: Orthopedics;  Laterality: Left;  . KNEE HARDWARE REMOVAL Right 11/30/2009   with debridement of scar tissue  . ORIF PROXIMAL TIBIAL PLATEAU FRACTURE Right 06/01/2009  . TOTAL VAGINAL HYSTERECTOMY  05/22/2000  . WISDOM TOOTH EXTRACTION      FAMILY HISTORY: Family History  Problem Relation Age of Onset  . Heart disease Paternal Grandfather   . Stomach cancer Paternal Grandmother   . Colon cancer Paternal Grandmother   . Heart murmur Father   . Heart disease Father        TAVR, pacemaker  . Heart failure Father   . Anesthesia problems Father        hard to wake up post-op    SOCIAL HISTORY:  Social History   Socioeconomic History  . Marital status: Married    Spouse name: Not on file  . Number of children: 2  . Years of education: Not on file  . Highest education level: Not on file  Occupational History  . Occupation: MRI Merchandiser, retail at [employer]: [employer]  Tobacco Use  . Smoking status: Never Smoker  . Smokeless tobacco: Never Used  Vaping Use  . Vaping Use: Never used  Substance and Sexual Activity  . Alcohol use: No    Alcohol/week: 0.0 standard drinks  . Drug use: No  . Sexual activity: Not on file  Other Topics Concern  . 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  Married.     Social Determinants of Health   Financial Resource Strain: Not on file  Food Insecurity: Not on file  Transportation Needs: Not on file  Physical Activity: Not on file  Stress: Not on file  Social Connections: Not on file  Intimate Partner Violence: Not on file     PHYSICAL EXAM  Vitals:   03/29/20 1546  BP:  110/66  Pulse: 76  Weight: 135 lb 6.4 oz (61.4 kg)  Height: 5' 8.5" (1.74 m)    Body mass index is 20.29 kg/m.   General: The patient is well-developed and well-nourished and in no acute distress  Neurologic Exam  Mental status: The patient is alert and oriented x 3 at the time of the examination. The patient has apparent normal recent and remote memory, with an apparently normal attention span and  concentration ability.   Speech is normal.  Cranial nerves: Extraocular muscles are intact.  Facial strength and sensation is normal.  Trapezius strength is normal.. No obvious hearing deficits are noted.  Motor:  Muscle bulk is normal.  Muscle tone is normal.  Strength is 5/5 in the arms and legs.  Sensory: She has intact sensation to touch and vibration. Coordination: Cerebellar testing shows good finger-nose-finger and heel-to-shin.  Gait and station: Station is normal.  Her gait is normal.  Tandem gait is near normal.  Romberg is negative.  Reflexes: Deep tendon reflexes are normal in the arms but brisk in the legs with spread at the knees.Marland Kitchen            DIAGNOSTIC DATA (LABS, IMAGING, TESTING) - I reviewed patient records, labs, notes, testing and imaging myself where available.  Lab Results  Component Value Date   WBC 9.1 11/04/2019   HGB 12.7 11/04/2019   HCT 38.7 11/04/2019   MCV 88 11/04/2019   PLT 344 11/04/2019      Component Value Date/Time   NA 139 11/04/2019 1636   K 4.6 11/04/2019 1636   CL 103 11/04/2019 1636   CO2 27 11/04/2019 1636   GLUCOSE 85 11/04/2019 1636   GLUCOSE 86 11/04/2018 0927   BUN 17 11/04/2019 1636   CREATININE 0.59 11/04/2019 1636   CALCIUM 9.3 11/04/2019 1636   PROT 6.9 11/04/2019 1636   ALBUMIN 4.3 11/04/2019 1636   AST 15 11/04/2019 1636   ALT 11 11/04/2019 1636   ALKPHOS 71 11/04/2019 1636   BILITOT <0.2 11/04/2019 1636   GFRNONAA 109 11/04/2019 1636   GFRAA 125 11/04/2019 1636   Lab Results  Component Value Date   CHOL 166  11/04/2018   HDL 48.20 11/04/2018   LDLCALC 101 (H) 11/04/2018   TRIG 84.0 11/04/2018   CHOLHDL 3 11/04/2018   No results found for: HGBA1C No results found for: VITAMINB12 Lab Results  Component Value Date   TSH 1.35 11/04/2018    _______________________________________________________________     Vitals:   03/29/20 1546  BP: 110/66  Pulse: 76  Height: 5' 8.5" (1.74 m)  Weight: 135 lb 6.4 oz (61.4 kg)  BMI (Calculated): 20.29     General: The patient is well-developed and well-nourished and in no acute distress  Neurologic Exam  Mental status: The patient is alert and oriented x 3 at the time of the examination. The patient has apparent normal recent and remote memory, with an apparently normal attention span and concentration ability.   Speech is normal.  Cranial nerves: Extraocular muscles are intact.  Facial strength and sensation is normal.  Trapezius strength is normal.. No obvious hearing deficits are noted.  Motor:  Muscle bulk is normal.  Muscle tone is normal.  Strength is 5/5 in the arms and legs.  Sensory: Normal sensation to touch and vibration. . Coordination: Cerebellar testing shows good finger-nose-finger and heel-to-shin.  Gait and station: Station is normal.  Her gait is normal.  Tandem gait is slightly wide.  Romberg is negative.  Reflexes: Deep tendon reflexes are normal in the arms but brisk in the legs with spread at the knees..            ASSESSMENT AND PLAN  Common migraine without intractability  Multiple sclerosis (HCC) - Plan: amphetamine-dextroamphetamine (ADDERALL XR) 30 MG 24 hr capsule  Other fatigue  Paresthesia  Insomnia, unspecified type  1.   She has remained stable off of a disease modifying therapy for  many years and will continue off of a medication for her MS. 2.   Continue Adderall XR 30 mg every morning and additional 10 mg in the afternoon if needed. 3.   Continue Topamax for migraines, add rizatriptan as  needed if she has a more severe migraine. 4.   She has possible restless leg syndrome and sleep maintenance insomnia. She will take gabapentin at bedtime to see if that helps the symptoms. 5.   Return in 6 months or sooner if there are new or worsening neurologic symptoms.  Donise Woodle A. Felecia Shelling, MD, PhD 06/25/3951, 2:02 PM Certified in Neurology, Clinical Neurophysiology, Sleep Medicine, Pain Medicine and Neuroimaging  [facility] [address], [place], [place] [address] [phone]

## 2020-04-17 DIAGNOSIS — M1712 Unilateral primary osteoarthritis, left knee: Secondary | ICD-10-CM | POA: Diagnosis not present

## 2020-04-26 ENCOUNTER — Encounter: Payer: Self-pay | Admitting: *Deleted

## 2020-04-28 MED FILL — ADDERALL XR 30 MG CAP SA: 30 | 30 days supply | Qty: 30 | Fill #0

## 2020-04-28 MED FILL — ONDANSETRON ODT 8 MG TABLET: 8 | 7 days supply | Qty: 20 | Fill #3

## 2020-05-15 ENCOUNTER — Encounter: Payer: 59 | Admitting: Internal Medicine

## 2020-05-15 DIAGNOSIS — Z0289 Encounter for other administrative examinations: Secondary | ICD-10-CM

## 2020-05-31 ENCOUNTER — Other Ambulatory Visit: Payer: Self-pay | Admitting: Neurology

## 2020-05-31 DIAGNOSIS — G35 Multiple sclerosis: Secondary | ICD-10-CM

## 2020-05-31 MED FILL — ADDERALL XR 30 MG CAP SA: 30 | 30 days supply | Qty: 30 | Fill #0

## 2020-06-14 ENCOUNTER — Other Ambulatory Visit (HOSPITAL_COMMUNITY): Payer: Self-pay | Admitting: Ophthalmology

## 2020-06-14 DIAGNOSIS — L980 Pyogenic granuloma: Secondary | ICD-10-CM | POA: Diagnosis not present

## 2020-06-14 MED FILL — PREDNISOLONE AC 1% EYE DROP: 1 | 50 days supply | Qty: 5 | Fill #0

## 2020-07-03 ENCOUNTER — Other Ambulatory Visit (HOSPITAL_COMMUNITY): Payer: Self-pay

## 2020-07-13 ENCOUNTER — Other Ambulatory Visit: Payer: Self-pay | Admitting: Neurology

## 2020-07-13 ENCOUNTER — Other Ambulatory Visit (HOSPITAL_COMMUNITY): Payer: Self-pay

## 2020-07-13 DIAGNOSIS — G35 Multiple sclerosis: Secondary | ICD-10-CM

## 2020-07-13 MED ORDER — AMPHETAMINE-DEXTROAMPHET ER 30 MG PO CP24
ORAL_CAPSULE | Freq: Every day | ORAL | 0 refills | Status: DC
Start: 2020-07-13 — End: 2020-08-24
  Filled 2020-07-13: qty 30, 30d supply, fill #0

## 2020-07-13 MED FILL — Topiramate Tab 50 MG: ORAL | 90 days supply | Qty: 270 | Fill #0 | Status: AC

## 2020-07-14 ENCOUNTER — Other Ambulatory Visit (HOSPITAL_COMMUNITY): Payer: Self-pay

## 2020-08-04 DIAGNOSIS — M25562 Pain in left knee: Secondary | ICD-10-CM | POA: Diagnosis not present

## 2020-08-24 ENCOUNTER — Other Ambulatory Visit (HOSPITAL_COMMUNITY): Payer: Self-pay

## 2020-08-24 ENCOUNTER — Other Ambulatory Visit: Payer: Self-pay | Admitting: Neurology

## 2020-08-24 DIAGNOSIS — G35 Multiple sclerosis: Secondary | ICD-10-CM

## 2020-08-24 MED ORDER — AMPHETAMINE-DEXTROAMPHET ER 30 MG PO CP24
ORAL_CAPSULE | Freq: Every day | ORAL | 0 refills | Status: DC
  Filled 2020-08-24: qty 30, 30d supply, fill #0

## 2020-09-11 ENCOUNTER — Other Ambulatory Visit (HOSPITAL_COMMUNITY): Payer: Self-pay

## 2020-09-11 MED ORDER — ERYTHROMYCIN 5 MG/GM OP OINT
TOPICAL_OINTMENT | OPHTHALMIC | 0 refills | Status: DC
  Filled 2020-09-11: qty 3.5, 5d supply, fill #0

## 2020-09-11 MED ORDER — FLUCONAZOLE 200 MG PO TABS
200.0000 mg | ORAL_TABLET | ORAL | 0 refills | Status: DC
  Filled 2020-09-11: qty 2, 3d supply, fill #0

## 2020-09-11 MED ORDER — DOXYCYCLINE HYCLATE 100 MG PO TABS
100.0000 mg | ORAL_TABLET | Freq: Two times a day (BID) | ORAL | 0 refills | Status: DC
  Filled 2020-09-11: qty 20, 10d supply, fill #0

## 2020-09-19 ENCOUNTER — Other Ambulatory Visit: Payer: Self-pay | Admitting: Neurology

## 2020-09-19 ENCOUNTER — Other Ambulatory Visit (HOSPITAL_COMMUNITY): Payer: Self-pay

## 2020-09-19 MED ORDER — ONDANSETRON 8 MG PO TBDP
ORAL_TABLET | ORAL | 5 refills | Status: DC
  Filled 2020-09-19: qty 20, 7d supply, fill #0
  Filled 2021-01-24: qty 20, 7d supply, fill #1
  Filled 2021-02-19: qty 20, 7d supply, fill #0
  Filled 2021-02-19: qty 20, 7d supply, fill #2
  Filled 2021-02-19: qty 20, 7d supply, fill #0
  Filled 2021-06-01: qty 20, 7d supply, fill #1
  Filled 2021-08-09: qty 20, 7d supply, fill #2

## 2020-09-19 MED FILL — Rizatriptan Benzoate Oral Disintegrating Tab 10 MG (Base Eq): ORAL | 15 days supply | Qty: 9 | Fill #0 | Status: AC

## 2020-10-06 ENCOUNTER — Telehealth: Payer: Self-pay | Admitting: Neurology

## 2020-10-06 ENCOUNTER — Other Ambulatory Visit (HOSPITAL_COMMUNITY): Payer: Self-pay

## 2020-10-06 DIAGNOSIS — G35 Multiple sclerosis: Secondary | ICD-10-CM

## 2020-10-09 ENCOUNTER — Other Ambulatory Visit (HOSPITAL_COMMUNITY): Payer: Self-pay

## 2020-10-09 MED ORDER — AMPHETAMINE-DEXTROAMPHET ER 30 MG PO CP24
ORAL_CAPSULE | Freq: Every day | ORAL | 0 refills | Status: DC
Start: 2020-10-09 — End: 2020-10-10
  Filled 2020-10-09: qty 30, 30d supply, fill #0

## 2020-10-09 NOTE — Telephone Encounter (Signed)
refill 

## 2020-10-10 ENCOUNTER — Encounter: Payer: Self-pay | Admitting: Family Medicine

## 2020-10-10 ENCOUNTER — Ambulatory Visit: Payer: 59 | Admitting: Family Medicine

## 2020-10-10 ENCOUNTER — Other Ambulatory Visit (HOSPITAL_COMMUNITY): Payer: Self-pay

## 2020-10-10 VITALS — BP 119/76 | HR 75 | Ht 68.0 in | Wt 136.0 lb

## 2020-10-10 DIAGNOSIS — G43009 Migraine without aura, not intractable, without status migrainosus: Secondary | ICD-10-CM

## 2020-10-10 DIAGNOSIS — G35 Multiple sclerosis: Secondary | ICD-10-CM

## 2020-10-10 DIAGNOSIS — G4719 Other hypersomnia: Secondary | ICD-10-CM

## 2020-10-10 DIAGNOSIS — R5383 Other fatigue: Secondary | ICD-10-CM | POA: Diagnosis not present

## 2020-10-10 MED ORDER — ARMODAFINIL 250 MG PO TABS
250.0000 mg | ORAL_TABLET | Freq: Every day | ORAL | 1 refills | Status: DC
  Filled 2020-10-10: qty 90, 90d supply, fill #0
  Filled 2021-01-24: qty 30, 30d supply, fill #1
  Filled 2021-02-19 – 2021-04-02 (×2): qty 30, 30d supply, fill #0
  Filled ????-??-??: fill #0

## 2020-10-10 NOTE — Patient Instructions (Signed)
Below is our plan:  We will try Nuvigil 250mg  daily for fatigue and daytime sleepiness. Discontinue Adderall.   Please make sure you are staying well hydrated. I recommend 50-60 ounces daily. Well balanced diet and regular exercise encouraged. Consistent sleep schedule with 6-8 hours recommended.   Please continue follow up with care team as directed.   Follow up with me in 6 months   You may receive a survey regarding today's visit. I encourage you to leave honest feed back as I do use this information to improve patient care. Thank you for seeing me today!

## 2020-10-10 NOTE — Progress Notes (Addendum)
PATIENT: Ashley May DOB: 1971-03-01  REASON FOR VISIT: follow up HISTORY FROM: patient  Chief Complaint  Patient presents with   Follow-up    Pt alone, rm 10. Presents for f/u visit. Overall stable. She is feeling tired during the day and would like to discuss something different. She is having problems with her sleep. Toss/turn through out the night.     HISTORY OF PRESENT ILLNESS: 10/10/20 ALL:  She returns for MS follow up. She is doing fairly well. No new or exacerbating symptoms. She has been off DMT for many years. MRIs have been stable. She denies new or worsening symptoms with exception of fatigue and daytime sleepiness. She does not feel Adderall is helping any longer. She tried Nuvigil years ago and remembers doing well on this medication. 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 She has a hard time getting to sleep. She ca not tolerate sleep aids due to waking at 3:30 am to go to work. She usually sleeps about 6 hours a night.   09/21/2019 ALL:  Ashley May is a 50 y.o. female here today for follow up for MS. She is not currently on DMT. Last exacerbation in 2000. Last imaging in 08/2017 and thought to be stable. She continues Adderall 25mg  XR daily in the mornings and 10mg  IR in the afternoons as needed. She often forgets the afternoon dose. She does report more fatigue recently and contributes this to working longer shifts. 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 She is taking topiramate 50-150mg  daily for headache management and feels headaches are well managed. She continues vitamin D 8000iu daily OTC. She denies any new or exacerbating symptoms.    HISTORY: (copied from Dr Garth Bigness note on 03/23/2019)  Ashley May is a 50 y.o. woman with multiple sclerosis.    Update 03/23/2019: She is doing well and remains off of a DMT (was on Betaseron many years ago for just a year or two).   Her last relapse was in 2000.        Gait, balance, sensation and strength.      She has a Lhermitte sign.    Bladder function is fine.    She feels vision is ok and saw ophthalmology recently.     She has fatigue.    She has to wake up early (4 am) and gets about 6 hours sleep.    Adderall helps.   She takes the XR in the morning and a 10 mg IR later in the day.         She has had more migraines lately.    If she sleeps her migrain usually goes away.        Her brother has MS also (PPMS) and has difficulty walking.   Update 07/09/2018: She feels her MS is stable and she has no exacerbations or new symptoms.    She has not been on a DMT x 6 years and has done well.   Gait, balance, strength and sensation are doing well.   Dysethesias improved on Oxcabazepine and she was able to stop a month ago and they did not return.   She has more fatigue, especially in the afternoons,   She is on Adderall XR 25 mg and takes at 530 am.  She is sleeping only 5-6 hours of sleep on work days.  Mood and cognition are doing well.   Migraines are doing well.  She  notes more if sleep deprived but has done well since being on topiramate and imipramine 10    Update 11/03/2017: Her dysesthesias are doing better with Trileptal 300 mg po qAM and 600 mg po qPM.   The dysesthesias were neck down and sometimes triggered by getting out of bed.   Bending over also caused some spells.    However, since she started the Tecfidera she is feeling much more tired during the days.  She does note that the Adderall is helping some.  Her current dose is 15 mg time release in the morning.   We discussed changes in dose   She denies any change in her gait or balance.  Strength is normal.  Although she has a dysesthesias, she denies numbness.   Bladder function is doing well.  She denies any changes in fatigue or mood.   Migraines are doing very well on Topamax and imipramine now only having 1-2 a month.    When one occurs Excedrin Migraine knocks it out.     Update  09/04/2017: She is noting episodes with a shock like sensation from her neck down lasting 10-20 seconds and associated with feeling weak.    Most of these spells occur a night when she changes positions in bed and only one occurred while sitting, but still with a change in position.   These started about 2-3 months ago and now occur 2-4 times every night.      She has been sleeping worse due to these symptoms.       She has been off of disease modifying therapy since about 2014 and had been on Betaseron for many years.  She was diagnosed around 53 after presenting with a cervical transverse myelitis and other evidence of MS.  She has had a Lhermitte sign off and on since diagnosis though the current symptoms seem to be different.   Her last cervical spine MRI was around 2010 and I personally reviewed it showing several plaques in the upper cervical spine.   She notes no change in gait, strength, sensation or bladder function.   Vision is fine.      Her mood is doing okay though she does have more stress with her husband recently being diagnosed with cancer.   Update 04/10/2017: She had a sinus infection and URI concurrently.  She began to note vertigo and reduced balance.    With a scopolamine patch, she did better but vertigo returned.    She took 10-14 days of amoxacillin.    Then she had a UTI and was on a cephalosporin.    She then had knee surgery 2 days ago.    With all these issues, she has noted that the vertigo returns 1 day or two after stopping scopolamine. She does not note veering with walking and the vertigo is not clearly a spinning or translational feeling but more like motion sickness like in the back seat of a car.   Her ENT doctor did not feel that this was due to her ear/vestibular system but rather MS.     Her last exacerbation was at least 15 years ago.   Her last MRI was in 2016 and was stable.   She has not any disease modifying therapy.   She feels gait has ben stabvle otehrwise.    No new numbness, weakness or bladder change.   Vision is unchanged.       Fatigue has been doing ok, even with infections.  Sleep is good.    Mood has been stale.   Cognition is fine.        From 10/07/2016 MS:    She feels her MS has been stable. Her last exacerbation was more than 15 years ago. She had several exacerbations in the first 5 years of her disease.   Her last MRI in 2016 did not show any new lesions.   Headache:   She reports a HA with pain in the occiput and pressure in the forehead and eyes.  She notes more stress at work with more hours.    It has been constant x 10 days.   They were doing better adter starting Topamax at the last visit.     Gait/strength/sensation: She feels that her gait is normal. She is able to walk long distances without difficulty. She can take steps with her eyes closed and not fall. The significant weakness in her arms or legs. She will rarely get the Lhermite sign and/or tingling in the chest.   Bladder: She denies any significant problems with her bladder. She has no urgency, frequency or hesitancy. She has no nocturia.   Vision:   She denies any MS related vision problem.    She had optic neuritis shortly after diagnosis with complete recovery..   Fatigue/sleep: This is been changes in her work schedule with more hours, she is more tired. She also feels a little bit worse than she is in the heat. Sleep has been much worse the last month.   She is sleeping only 5-6 hours due to changes in her schedule and difficulty with sleep maintenance.     She'll have nocturia once some nights.   Mood/cognition: She denies any depression or anxiety. She notes mild cognitive issues.    As an example, she goes into a room and is not sure why she is there and has mild forgetfulness   She notes this seems worse associated with longer hours at work (55-60 hours).   Meningioma:   MRIs have shown a small meningioma. This has been stable to slightly increased over many  years.   MS History:   She presented in 1994 with a Lhermite sign and tingling in her hands. An MRI of the cervical spine revealed a cervical plaque. She also had an MRI of the brain and a lumbar puncture at that time. The studies were consistent with multiple sclerosis.   She was placed on Betaseron but had a lot of difficulty tolerating the medication. Specifically she would get severe myalgias and fatigue. Therefore, she used the medication intermittently over the next 7 years or so. In 2000, she delivered a healthy daughter. A few months later, she became pregnant again but this pregnancy was complicated by adenomyosis and she required a hysterectomy. Afterwards, she had an exacerbation. Her symptoms at that time included a poor gait with a Romberg sign. She was placed on several days of IV steroids and improved. Around 2001, she stopped Betaseron and has not been on any therapy since. Most of this time, she was seeing Dr. Jacqulynn Cadet. She last saw him in 2014.   I first saw her May 2016.       Review of studies:   MRI of the brain dated 08/18/2015 and compared it to the prior MRI. There are white matter lesions in a pattern and configuration consistent with multiple sclerosis. They are all located in the hemispheres and there are no brainstem or cerebellar lesions.   Additionally,  there is a 1.2 cm right frontal meningioma that was unchanged.   MRI of the cervical spine12/31/2007 shows midline posterior plaque at C2 and left posterior plaque at C3.    She had another MRI of the brain 06/08/2012 compared to 2011 MRI shows a similar pattern and configuration of white matter foci. In the interval, there has only been the development of only 1 new small plaque in the right cerebral hemisphere. As before, there were no posterior fossa lesions. That study was done without contrast.  The meningioma looks about the same size. Chronic left maxillary and ethmoid sinusitis is noted.   FH:  Her brother (one year older)  has primary progressive MS and has right hemiplegia.     REVIEW OF SYSTEMS: Out of a complete 14 system review of symptoms, the patient complains only of the following symptoms, fatigue, headaches and all other reviewed systems are negative.  ESS: 11 FSS: 33  ALLERGIES: Allergies  Allergen Reactions   Prochlorperazine Edisylate Anaphylaxis   Eggs Or Egg-Derived Products Nausea And Vomiting   Gadolinium Derivatives Nausea And Vomiting   Morphine Itching    HOME MEDICATIONS: Outpatient Medications Prior to Visit  Medication Sig Dispense Refill   cholecalciferol (VITAMIN D) 1000 units tablet Take 8,000 Units by mouth daily.      gabapentin (NEURONTIN) 300 MG capsule TAKE 1 TO 2 CAPSULES BY MOUTH AT BEDTIME 180 capsule 3   Multiple Vitamin (MULTIVITAMIN) tablet Take 1 tablet by mouth daily.     ondansetron (ZOFRAN-ODT) 8 MG disintegrating tablet DISSOLVE 1 TABLET BY MOUTH EVERY 8 HOURS AS NEEDED FOR NAUSEA OR VOMITING. 20 tablet 5   rizatriptan (MAXALT-MLT) 10 MG disintegrating tablet DISSOLVE 1 TABLET BY MOUTH AS NEEDED FOR MIGRAINE, MAY REPEAT IN 2 HOURS AS NEEDED 9 tablet 11   topiramate (TOPAMAX) 50 MG tablet TAKE 2-3 TABLETS BY MOUTH AT BEDTIME 270 tablet 4   amphetamine-dextroamphetamine (ADDERALL XR) 30 MG 24 hr capsule TAKE 1 CAPSULE BY MOUTH ONCE A DAY 30 capsule 0   amphetamine-dextroamphetamine (ADDERALL) 10 MG tablet TAKE 1 TABLET BY MOUTH EVERY AFTERNOON AS NEEDED 30 tablet 0   doxycycline (VIBRA-TABS) 100 MG tablet Take 1 tablet (100 mg total) by mouth 2 (two) times daily for 10 days 20 tablet 0   erythromycin ophthalmic ointment Apply ointment into right eye every 6-8 hours as directed 3.5 g 0   fluconazole (DIFLUCAN) 200 MG tablet Take 1 tablet (200 mg total) by mouth once, may repeat in 3 days if symptoms persist 2 tablet 0   prednisoLONE acetate (PRED FORTE) 1 % ophthalmic suspension PLACE 1 DROP INTO THE LEFT EYE 2 TIMES DAILY 5 mL 0   No facility-administered  medications prior to visit.    PAST MEDICAL HISTORY: Past Medical History:  Diagnosis Date   Chondromalacia of left patella 40/9811   Complication of anesthesia    slow to awaken after anesthesia for wisdom teeth extraction   Family history of adverse reaction to anesthesia    pt's father has hx. of being hard to wake up post-op   Lateral meniscus tear 10/2016   left   Meningioma ([facility])    right frontal   Migraines    Multiple sclerosis ([facility])    PONV (postoperative nausea and vomiting)    PVC's (premature ventricular contractions)    no current med.    PAST SURGICAL HISTORY: Past Surgical History:  Procedure Laterality Date   AUGMENTATION MAMMAPLASTY Bilateral    1994   BREAST  BIOPSY Right 08/14/2016   BREAST ENHANCEMENT SURGERY     CHONDROPLASTY Left 10/31/2016   Procedure: LEFT KNEE CHONDROPLASTY;  Surgeon: Renette Butters, MD;  Location: [facility];  Service: Orthopedics;  Laterality: Left;   ESOPHAGOGASTRODUODENOSCOPY (EGD) WITH PROPOFOL N/A 07/05/2015   Procedure: ESOPHAGOGASTRODUODENOSCOPY (EGD) WITH PROPOFOL;  Surgeon: Arta Silence, MD;  Location: [facility];  Service: Endoscopy;  Laterality: N/A;   KNEE ARTHROSCOPY WITH LATERAL MENISECTOMY Left 10/31/2016   Procedure: LEFT KNEE ARTHROSCOPY WITH LATERAL MENISCECTOMY;  Surgeon: Renette Butters, MD;  Location: [facility];  Service: Orthopedics;  Laterality: Left;   KNEE HARDWARE REMOVAL Right 11/30/2009   with debridement of scar tissue   ORIF PROXIMAL TIBIAL PLATEAU FRACTURE Right 06/01/2009   TOTAL VAGINAL HYSTERECTOMY  05/22/2000   WISDOM TOOTH EXTRACTION      FAMILY HISTORY: Family History  Problem Relation Age of Onset   Heart disease Paternal Grandfather    Stomach cancer Paternal Grandmother    Colon cancer Paternal Grandmother    Heart murmur Father    Heart disease Father        TAVR, pacemaker   Heart failure Father    Anesthesia problems Father        hard to wake up  post-op    SOCIAL HISTORY: Social History   Socioeconomic History   Marital status: Married    Spouse name: Not on file   Number of children: 2   Years of education: Not on file   Highest education level: Not on file  Occupational History   Occupation: MRI Librarian, academic at [employer]: [employer] Use   Smoking status: Never   Smokeless tobacco: Never  Vaping Use   Vaping Use: Never used  Substance and Sexual Activity   Alcohol use: No    Alcohol/week: 0.0 standard drinks   Drug use: No   Sexual activity: Not on file  Other Topics Concern   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  Married.     Social Determinants of Health   Financial Resource Strain: Not on file  Food Insecurity: Not on file  Transportation Needs: Not on file  Physical Activity: Not on file  Stress: Not on file  Social Connections: Not on file  Intimate Partner Violence: Not on file      PHYSICAL EXAM  Vitals:   10/10/20 1520  BP: 119/76  Pulse: 75  Weight: 136 lb (61.7 kg)  Height: 5\' 8"  (1.727 m)   Body mass index is 20.68 kg/m.  Generalized: Well developed, in no acute distress  Cardiology: normal rate and rhythm, no murmur noted Respiratory: clear to auscultation bilaterally  Neurological examination  Mentation: Alert oriented to time, place, history taking. Follows all commands speech and language fluent Cranial nerve II-XII: Pupils were equal round reactive to light. Extraocular movements were full, visual field were full on confrontational test. Facial sensation and strength were normal.  Head turning and shoulder shrug  were normal and symmetric. Motor: The motor testing reveals 5 over 5 strength of all 4 extremities. Good symmetric motor tone is noted throughout.  Gait and station: Gait is normal.    DIAGNOSTIC DATA (LABS, IMAGING, TESTING) - I reviewed patient records, labs, notes, testing and imaging myself where available.  No flowsheet  data found.   Lab Results  Component Value Date   WBC 9.1 11/04/2019   HGB 12.7 11/04/2019   HCT  38.7 11/04/2019   MCV 88 11/04/2019   PLT 344 11/04/2019      Component Value Date/Time   NA 139 11/04/2019 1636   K 4.6 11/04/2019 1636   CL 103 11/04/2019 1636   CO2 27 11/04/2019 1636   GLUCOSE 85 11/04/2019 1636   GLUCOSE 86 11/04/2018 0927   BUN 17 11/04/2019 1636   CREATININE 0.59 11/04/2019 1636   CALCIUM 9.3 11/04/2019 1636   PROT 6.9 11/04/2019 1636   ALBUMIN 4.3 11/04/2019 1636   AST 15 11/04/2019 1636   ALT 11 11/04/2019 1636   ALKPHOS 71 11/04/2019 1636   BILITOT <0.2 11/04/2019 1636   GFRNONAA 109 11/04/2019 1636   GFRAA 125 11/04/2019 1636   Lab Results  Component Value Date   CHOL 166 11/04/2018   HDL 48.20 11/04/2018   LDLCALC 101 (H) 11/04/2018   TRIG 84.0 11/04/2018   CHOLHDL 3 11/04/2018   No results found for: HGBA1C No results found for: VITAMINB12 Lab Results  Component Value Date   TSH 1.35 11/04/2018       ASSESSMENT AND PLAN 50 y.o. year old female  has a past medical history of Chondromalacia of left patella (79/0240), Complication of anesthesia, Family history of adverse reaction to anesthesia, Lateral meniscus tear (10/2016), Meningioma ([place]), Migraines, Multiple sclerosis ([place]), PONV (postoperative nausea and vomiting), and PVC's (premature ventricular contractions). here with     ICD-10-CM   1. Multiple sclerosis ([place])  G35     2. Excessive daytime sleepiness  G47.19     3. Common migraine without intractability  G43.009     4. Other fatigue  R53.83        Lotta is doing very well. No new or exacerbating symptoms. MS has been stable off DMT for many years. I will switch her to Nuvigil 250mg  daily for MS fatigue and daytime sleepiness. We have reviewed sleep hygiene recommendations. Migraines are well managed. She was encouraged to continue topiramate 100-150mg  at bedtime, rizatriptan as needed and vit d supplements. She will  continue healthy lifestyle habits. Follow up in 6-8 months advised. She verbalizes understanding and agreement with this plan.    No orders of the defined types were placed in this encounter.    Meds ordered this encounter  Medications   Armodafinil (NUVIGIL) 250 MG tablet    Sig: Take 1 tablet (250 mg total) by mouth daily.    Dispense:  90 tablet    Refill:  1    Order Specific Question:   Supervising Provider    Answer:   Melvenia Beam [[id number]]      JME QASTM, FNP-C 10/10/2020, 4:12 PM [facility] [address] [place], [place] [address] [phone]    I have read the note, and I agree with the clinical assessment and plan.  Richard A. Felecia Shelling, MD, PhD, [facility] Certified in Neurology, Clinical Neurophysiology, Sleep Medicine, Pain Medicine and Neuroimaging  [facility] [address], [place], [place] [address] [phone]

## 2020-10-11 ENCOUNTER — Other Ambulatory Visit (HOSPITAL_COMMUNITY): Payer: Self-pay

## 2020-10-12 DIAGNOSIS — M25562 Pain in left knee: Secondary | ICD-10-CM | POA: Diagnosis not present

## 2020-10-30 DIAGNOSIS — M25662 Stiffness of left knee, not elsewhere classified: Secondary | ICD-10-CM | POA: Diagnosis not present

## 2020-11-01 DIAGNOSIS — M25562 Pain in left knee: Secondary | ICD-10-CM | POA: Diagnosis not present

## 2020-11-01 DIAGNOSIS — M25662 Stiffness of left knee, not elsewhere classified: Secondary | ICD-10-CM | POA: Diagnosis not present

## 2020-11-16 DIAGNOSIS — M25662 Stiffness of left knee, not elsewhere classified: Secondary | ICD-10-CM | POA: Diagnosis not present

## 2020-11-16 DIAGNOSIS — M25562 Pain in left knee: Secondary | ICD-10-CM | POA: Diagnosis not present

## 2020-11-20 DIAGNOSIS — M25662 Stiffness of left knee, not elsewhere classified: Secondary | ICD-10-CM | POA: Diagnosis not present

## 2020-11-20 DIAGNOSIS — M25562 Pain in left knee: Secondary | ICD-10-CM | POA: Diagnosis not present

## 2020-12-06 DIAGNOSIS — M25562 Pain in left knee: Secondary | ICD-10-CM | POA: Diagnosis not present

## 2020-12-06 DIAGNOSIS — M25662 Stiffness of left knee, not elsewhere classified: Secondary | ICD-10-CM | POA: Diagnosis not present

## 2020-12-11 DIAGNOSIS — M25562 Pain in left knee: Secondary | ICD-10-CM | POA: Diagnosis not present

## 2020-12-11 DIAGNOSIS — M25662 Stiffness of left knee, not elsewhere classified: Secondary | ICD-10-CM | POA: Diagnosis not present

## 2020-12-13 DIAGNOSIS — M25662 Stiffness of left knee, not elsewhere classified: Secondary | ICD-10-CM | POA: Diagnosis not present

## 2020-12-20 DIAGNOSIS — M25662 Stiffness of left knee, not elsewhere classified: Secondary | ICD-10-CM | POA: Diagnosis not present

## 2020-12-20 DIAGNOSIS — M25562 Pain in left knee: Secondary | ICD-10-CM | POA: Diagnosis not present

## 2020-12-28 DIAGNOSIS — M1712 Unilateral primary osteoarthritis, left knee: Secondary | ICD-10-CM | POA: Diagnosis not present

## 2021-01-08 DIAGNOSIS — M25562 Pain in left knee: Secondary | ICD-10-CM | POA: Diagnosis not present

## 2021-01-08 DIAGNOSIS — M25662 Stiffness of left knee, not elsewhere classified: Secondary | ICD-10-CM | POA: Diagnosis not present

## 2021-01-17 DIAGNOSIS — M25562 Pain in left knee: Secondary | ICD-10-CM | POA: Diagnosis not present

## 2021-01-17 DIAGNOSIS — M25662 Stiffness of left knee, not elsewhere classified: Secondary | ICD-10-CM | POA: Diagnosis not present

## 2021-01-24 ENCOUNTER — Other Ambulatory Visit (HOSPITAL_COMMUNITY): Payer: Self-pay

## 2021-01-24 DIAGNOSIS — M25562 Pain in left knee: Secondary | ICD-10-CM | POA: Diagnosis not present

## 2021-01-24 DIAGNOSIS — M25662 Stiffness of left knee, not elsewhere classified: Secondary | ICD-10-CM | POA: Diagnosis not present

## 2021-01-31 DIAGNOSIS — M25562 Pain in left knee: Secondary | ICD-10-CM | POA: Diagnosis not present

## 2021-01-31 DIAGNOSIS — M25662 Stiffness of left knee, not elsewhere classified: Secondary | ICD-10-CM | POA: Diagnosis not present

## 2021-02-05 DIAGNOSIS — M25562 Pain in left knee: Secondary | ICD-10-CM | POA: Diagnosis not present

## 2021-02-05 DIAGNOSIS — M25662 Stiffness of left knee, not elsewhere classified: Secondary | ICD-10-CM | POA: Diagnosis not present

## 2021-02-12 DIAGNOSIS — M25562 Pain in left knee: Secondary | ICD-10-CM | POA: Diagnosis not present

## 2021-02-12 DIAGNOSIS — M25662 Stiffness of left knee, not elsewhere classified: Secondary | ICD-10-CM | POA: Diagnosis not present

## 2021-02-19 ENCOUNTER — Other Ambulatory Visit (HOSPITAL_COMMUNITY): Payer: Self-pay

## 2021-02-19 ENCOUNTER — Other Ambulatory Visit (HOSPITAL_BASED_OUTPATIENT_CLINIC_OR_DEPARTMENT_OTHER): Payer: Self-pay

## 2021-02-19 MED ORDER — ARMODAFINIL 250 MG PO TABS
250.0000 mg | ORAL_TABLET | Freq: Every day | ORAL | 0 refills | Status: DC
  Filled 2021-02-19: qty 60, 60d supply, fill #0

## 2021-02-20 ENCOUNTER — Other Ambulatory Visit (HOSPITAL_COMMUNITY): Payer: Self-pay

## 2021-02-21 ENCOUNTER — Other Ambulatory Visit (HOSPITAL_COMMUNITY): Payer: Self-pay

## 2021-02-22 ENCOUNTER — Encounter: Payer: Self-pay | Admitting: Internal Medicine

## 2021-03-01 ENCOUNTER — Other Ambulatory Visit (HOSPITAL_COMMUNITY)
Admission: RE | Admit: 2021-03-01 | Discharge: 2021-03-01 | Disposition: A | Discharge status: Home / Self Care | Payer: 59 | Source: Ambulatory Visit | Attending: Family Medicine | Admitting: Family Medicine

## 2021-03-01 ENCOUNTER — Other Ambulatory Visit: Payer: Self-pay

## 2021-03-01 ENCOUNTER — Telehealth (INDEPENDENT_AMBULATORY_CARE_PROVIDER_SITE_OTHER): Payer: 59 | Admitting: Family Medicine

## 2021-03-01 ENCOUNTER — Other Ambulatory Visit (HOSPITAL_COMMUNITY): Payer: Self-pay

## 2021-03-01 ENCOUNTER — Encounter: Payer: Self-pay | Admitting: Family Medicine

## 2021-03-01 VITALS — Ht 68.0 in | Wt 136.0 lb

## 2021-03-01 DIAGNOSIS — R110 Nausea: Secondary | ICD-10-CM

## 2021-03-01 LAB — COMPREHENSIVE METABOLIC PANEL
ALT: 19 U/L (ref 0–44)
AST: 24 U/L (ref 15–41)
Albumin: 3.9 g/dL (ref 3.5–5.0)
Alkaline Phosphatase: 68 U/L (ref 38–126)
Anion gap: 6 (ref 5–15)
BUN: 19 mg/dL (ref 6–20)
CO2: 28 mmol/L (ref 22–32)
Calcium: 8.9 mg/dL (ref 8.9–10.3)
Chloride: 103 mmol/L (ref 98–111)
Creatinine, Ser: 0.61 mg/dL (ref 0.44–1.00)
GFR, Estimated: >60 mL/min (ref >60)
Glucose, Bld: 86 mg/dL (ref 70–99)
Potassium: 3.7 mmol/L (ref 3.5–5.1)
Sodium: 137 mmol/L (ref 135–145)
Total Bilirubin: 0.4 mg/dL (ref 0.3–1.2)
Total Protein: 6.9 g/dL (ref 6.5–8.1)

## 2021-03-01 LAB — CBC WITH DIFFERENTIAL/PLATELET
Abs Immature Granulocytes: 0.04 10*3/uL (ref 0.00–0.07)
Basophils Absolute: 0.1 10*3/uL (ref 0.0–0.1)
Basophils Relative: 1 %
Eosinophils Absolute: 1.6 10*3/uL — ABNORMAL HIGH (ref 0.0–0.5)
Eosinophils Relative: 13 %
HCT: 40.4 % (ref 36.0–46.0)
Hemoglobin: 13.4 g/dL (ref 12.0–15.0)
Immature Granulocytes: 0 %
Lymphocytes Relative: 29 %
Lymphs Abs: 3.5 10*3/uL (ref 0.7–4.0)
MCH: 29.3 pg (ref 26.0–34.0)
MCHC: 33.2 g/dL (ref 30.0–36.0)
MCV: 88.4 fL (ref 80.0–100.0)
Monocytes Absolute: 0.8 10*3/uL (ref 0.1–1.0)
Monocytes Relative: 7 %
Neutro Abs: 6 10*3/uL (ref 1.7–7.7)
Neutrophils Relative %: 50 %
Platelets: 352 10*3/uL (ref 150–400)
RBC: 4.57 MIL/uL (ref 3.87–5.11)
RDW: 12.6 % (ref 11.5–15.5)
WBC: 12 10*3/uL — ABNORMAL HIGH (ref 4.0–10.5)
nRBC: 0 % (ref 0.0–0.2)

## 2021-03-01 LAB — LIPASE, BLOOD: Lipase: 56 U/L — ABNORMAL HIGH (ref 11–51)

## 2021-03-01 NOTE — Assessment & Plan Note (Addendum)
No clear cause.  No new meds, no NSAIDs, no food triggers.  Gastritis vs PUD vs pancreatitis vs gallbladder disease. Less likely atypical migraine.  Will eval with labs.. CMET, cbc, lipase.  If unremarkable consider in person exam vs [place]. Increase Prilosec to 40 mg daily, avoid acidic foods and GEDf triggers.

## 2021-03-01 NOTE — Telephone Encounter (Signed)
Pt scheduled for VV 12/8 at 10:40 with Amy [facility]

## 2021-03-01 NOTE — Progress Notes (Signed)
VIRTUAL VISIT Due to national recommendations of social distancing due to [place] 19, a virtual visit is felt to be most appropriate for this patient at this time.   I connected with the patient on 03/01/21 at 10:40 AM EST by virtual telehealth platform and verified that I am speaking with the correct person using two identifiers.   I discussed the limitations, risks, security and privacy concerns of performing an evaluation and management service by  virtual telehealth platform and the availability of in person appointments. I also discussed with the patient that there may be a patient responsible charge related to this service. The patient expressed understanding and agreed to proceed.  Patient location: Home Provider Location: [facility] [place] Participants: Eliezer Lofts and [place]   Chief Complaint  Patient presents with   Nausea    X 2.5 months-Not associated with foot that she can come up with-Taking Zofran 1 to 2 times a day    History of Present Illness:  50 year old female patient with history of multiple sclerosis of Ashley May's presents with 2 months of nausea.  She report she has intermittent nausea off and on.. occ in middle of night or in AM/PM.. not releated to food.  No abdominal pain.. feels nausea on epigastrium.  No emesis, no D/C, no blood in stool, no dark stool. No fever.  No heartburn.   No change in diet.  Zofran helps temporarily. She has tried prilosec 20 mg daily x 10 days.   Had gallbladder. S/P hysterectomy.  No new meds, no new supplements.   Only new med.Marland Kitchenar modafinil for 6 months.  Not on any MS meds.   No migraine.     COVID 19 screen No recent travel or known exposure to COVID19 The patient denies respiratory symptoms of COVID 19 at this time.  The importance of social distancing was discussed today.   ROS    Past Medical History:  Diagnosis Date   Chondromalacia of left patella 16/1096   Complication of anesthesia     slow to awaken after anesthesia for wisdom teeth extraction   Family history of adverse reaction to anesthesia    pt's father has hx. of being hard to wake up post-op   Lateral meniscus tear 10/2016   left   Meningioma (HCC)    right frontal   Migraines    Multiple sclerosis (HCC)    PONV (postoperative nausea and vomiting)    PVC's (premature ventricular contractions)    no current med.    reports that she has never smoked. She has never used smokeless tobacco. She reports that she does not drink alcohol and does not use drugs.   Current Outpatient Medications:    Armodafinil (NUVIGIL) 250 MG tablet, Take 1 tablet (250 mg total) by mouth daily., Disp: 90 tablet, Rfl: 1   cholecalciferol (VITAMIN D) 1000 units tablet, Take 8,000 Units by mouth daily. , Disp: , Rfl:    gabapentin (NEURONTIN) 300 MG capsule, TAKE 1 TO 2 CAPSULES BY MOUTH AT BEDTIME, Disp: 180 capsule, Rfl: 3   Multiple Vitamin (MULTIVITAMIN) tablet, Take 1 tablet by mouth daily., Disp: , Rfl:    ondansetron (ZOFRAN-ODT) 8 MG disintegrating tablet, DISSOLVE 1 TABLET BY MOUTH EVERY 8 HOURS AS NEEDED FOR NAUSEA OR VOMITING., Disp: 20 tablet, Rfl: 5   rizatriptan (MAXALT-MLT) 10 MG disintegrating tablet, DISSOLVE 1 TABLET BY MOUTH AS NEEDED FOR MIGRAINE, MAY REPEAT IN 2 HOURS AS NEEDED, Disp: 9 tablet, Rfl: 11  topiramate (TOPAMAX) 50 MG tablet, TAKE 2-3 TABLETS BY MOUTH AT BEDTIME, Disp: 270 tablet, Rfl: 4   Observations/Objective: Height 5\' 8"  (1.727 m), weight 136 lb (61.7 kg).  Physical Exam  Physical Exam Constitutional:      General: The patient is not in acute distress. Pulmonary:     Effort: Pulmonary effort is normal. No respiratory distress.  Neurological:     Mental Status: The patient is alert and oriented to person, place, and time.  Psychiatric:        Mood and Affect: Mood normal.        Behavior: Behavior normal.   Assessment and Plan Problem List Items Addressed This Visit     Chronic nausea -  Primary    No clear cause.  No new meds, no NSAIDs, no food triggers.  Gastritis vs PUD vs pancreatitis vs gallbladder disease. Less likely atypical migraine.  Will eval with labs.. CMET, cbc, lipase.  If unremarkable consider in person exam vs [place]. Increase Prilosec to 40 mg daily, avoid acidic foods and GEDf triggers.      Relevant Orders   CBC with Differential/Platelet   Comprehensive metabolic panel   Lipase      I discussed the assessment and treatment plan with the patient. The patient was provided an opportunity to ask questions and all were answered. The patient agreed with the plan and demonstrated an understanding of the instructions.   The patient was advised to call back or seek an in-person evaluation if the symptoms worsen or if the condition fails to improve as anticipated.     Eliezer Lofts, MD

## 2021-03-02 ENCOUNTER — Encounter: Payer: Self-pay | Admitting: Family Medicine

## 2021-03-05 ENCOUNTER — Other Ambulatory Visit: Payer: Self-pay | Admitting: Family Medicine

## 2021-03-05 DIAGNOSIS — R110 Nausea: Secondary | ICD-10-CM

## 2021-03-05 NOTE — Telephone Encounter (Signed)
FYI order changed

## 2021-03-06 ENCOUNTER — Encounter: Payer: Self-pay | Admitting: Family Medicine

## 2021-03-06 ENCOUNTER — Ambulatory Visit (HOSPITAL_COMMUNITY)
Admission: RE | Admit: 2021-03-06 | Discharge: 2021-03-06 | Disposition: A | Discharge status: Home / Self Care | Payer: 59 | Source: Ambulatory Visit | Attending: Family Medicine | Admitting: Family Medicine

## 2021-03-06 ENCOUNTER — Other Ambulatory Visit: Payer: Self-pay | Admitting: Family Medicine

## 2021-03-06 ENCOUNTER — Other Ambulatory Visit: Payer: Self-pay

## 2021-03-06 DIAGNOSIS — R110 Nausea: Secondary | ICD-10-CM | POA: Diagnosis not present

## 2021-03-06 DIAGNOSIS — K824 Cholesterolosis of gallbladder: Secondary | ICD-10-CM | POA: Diagnosis not present

## 2021-03-20 DIAGNOSIS — M25562 Pain in left knee: Secondary | ICD-10-CM | POA: Diagnosis not present

## 2021-03-20 DIAGNOSIS — M25662 Stiffness of left knee, not elsewhere classified: Secondary | ICD-10-CM | POA: Diagnosis not present

## 2021-03-21 ENCOUNTER — Telehealth: Payer: Self-pay | Admitting: Internal Medicine

## 2021-03-21 NOTE — Telephone Encounter (Signed)
Please call Lamonica.  Confirm doing ok.  I had received a notification from Dr Diona Browner.  She recently saw Horris Latino for nausea.  She had an abdominal ultrasound.  Please schedule [facility] for a f/u appt with me.  (If she needs it to be a virtual visit, ok for virtual).

## 2021-03-22 NOTE — Telephone Encounter (Signed)
Patient confirmed doing ok. Still having some nausea intermittently. Agreed to f/u with Dr Nicki Reaper but requested Friday appt since she is off on Fridays. Scheduled for 1/27.

## 2021-03-22 NOTE — Telephone Encounter (Signed)
Called patient,. Unable to leave message

## 2021-03-23 DIAGNOSIS — M25662 Stiffness of left knee, not elsewhere classified: Secondary | ICD-10-CM | POA: Diagnosis not present

## 2021-03-23 DIAGNOSIS — M25562 Pain in left knee: Secondary | ICD-10-CM | POA: Diagnosis not present

## 2021-03-28 DIAGNOSIS — M25662 Stiffness of left knee, not elsewhere classified: Secondary | ICD-10-CM | POA: Diagnosis not present

## 2021-03-28 DIAGNOSIS — M25562 Pain in left knee: Secondary | ICD-10-CM | POA: Diagnosis not present

## 2021-04-02 ENCOUNTER — Other Ambulatory Visit (HOSPITAL_COMMUNITY): Payer: Self-pay

## 2021-04-02 ENCOUNTER — Other Ambulatory Visit (HOSPITAL_BASED_OUTPATIENT_CLINIC_OR_DEPARTMENT_OTHER): Payer: Self-pay

## 2021-04-06 DIAGNOSIS — R635 Abnormal weight gain: Secondary | ICD-10-CM | POA: Diagnosis not present

## 2021-04-06 DIAGNOSIS — N951 Menopausal and female climacteric states: Secondary | ICD-10-CM | POA: Diagnosis not present

## 2021-04-06 DIAGNOSIS — M25562 Pain in left knee: Secondary | ICD-10-CM | POA: Diagnosis not present

## 2021-04-06 DIAGNOSIS — M25662 Stiffness of left knee, not elsewhere classified: Secondary | ICD-10-CM | POA: Diagnosis not present

## 2021-04-09 ENCOUNTER — Encounter: Payer: Self-pay | Admitting: Family Medicine

## 2021-04-09 ENCOUNTER — Other Ambulatory Visit: Payer: Self-pay | Admitting: *Deleted

## 2021-04-09 ENCOUNTER — Other Ambulatory Visit (HOSPITAL_BASED_OUTPATIENT_CLINIC_OR_DEPARTMENT_OTHER): Payer: Self-pay

## 2021-04-09 ENCOUNTER — Other Ambulatory Visit (HOSPITAL_COMMUNITY): Payer: Self-pay

## 2021-04-09 MED ORDER — ARMODAFINIL 250 MG PO TABS
250.0000 mg | ORAL_TABLET | Freq: Every day | ORAL | 1 refills | Status: DC
  Filled 2021-04-09 (×2): qty 90, 90d supply, fill #0

## 2021-04-10 ENCOUNTER — Other Ambulatory Visit (HOSPITAL_COMMUNITY): Payer: Self-pay

## 2021-04-13 DIAGNOSIS — M25662 Stiffness of left knee, not elsewhere classified: Secondary | ICD-10-CM | POA: Diagnosis not present

## 2021-04-13 DIAGNOSIS — M25562 Pain in left knee: Secondary | ICD-10-CM | POA: Diagnosis not present

## 2021-04-16 ENCOUNTER — Ambulatory Visit: Payer: 59 | Admitting: Family Medicine

## 2021-04-17 ENCOUNTER — Encounter: Payer: Self-pay | Admitting: Internal Medicine

## 2021-04-17 DIAGNOSIS — M25562 Pain in left knee: Secondary | ICD-10-CM | POA: Diagnosis not present

## 2021-04-17 DIAGNOSIS — M25662 Stiffness of left knee, not elsewhere classified: Secondary | ICD-10-CM | POA: Diagnosis not present

## 2021-04-19 ENCOUNTER — Encounter: Payer: Self-pay | Admitting: Family Medicine

## 2021-04-19 ENCOUNTER — Ambulatory Visit: Payer: 59 | Admitting: Family Medicine

## 2021-04-19 ENCOUNTER — Other Ambulatory Visit (HOSPITAL_COMMUNITY): Payer: Self-pay

## 2021-04-19 VITALS — BP 125/75 | HR 102 | Ht 68.0 in | Wt 136.0 lb

## 2021-04-19 DIAGNOSIS — G43009 Migraine without aura, not intractable, without status migrainosus: Secondary | ICD-10-CM

## 2021-04-19 DIAGNOSIS — E559 Vitamin D deficiency, unspecified: Secondary | ICD-10-CM | POA: Diagnosis not present

## 2021-04-19 DIAGNOSIS — R5383 Other fatigue: Secondary | ICD-10-CM | POA: Diagnosis not present

## 2021-04-19 DIAGNOSIS — G4700 Insomnia, unspecified: Secondary | ICD-10-CM

## 2021-04-19 DIAGNOSIS — G4719 Other hypersomnia: Secondary | ICD-10-CM | POA: Diagnosis not present

## 2021-04-19 DIAGNOSIS — G35 Multiple sclerosis: Secondary | ICD-10-CM

## 2021-04-19 MED ORDER — AMPHETAMINE-DEXTROAMPHET ER 30 MG PO CP24
30.0000 mg | ORAL_CAPSULE | Freq: Every day | ORAL | 0 refills | Status: DC
  Filled 2021-04-19: qty 30, 30d supply, fill #0

## 2021-04-19 NOTE — Patient Instructions (Addendum)
Below is our plan:  We will switch you  back to Adderall 30 MG XR daily.  Try taking melatonin for you insomnia.  There is instant release that helps you fall asleep, and extended release that may help you stay asleep.  Try taking 5,000 IU OTC of Vitamin D MRI order placed for follow up on your MS  Please make sure you are staying well hydrated. I recommend 50-60 ounces daily. Well balanced diet and regular exercise encouraged. Consistent sleep schedule with 6-8 hours recommended.   Please continue follow up with care team as directed.   Follow up with Dr. Felecia Shelling in 1 year or sooner if needed  You may receive a survey regarding today's visit. I encourage you to leave honest feed back as I do use this information to improve patient care. Thank you for seeing me today!

## 2021-04-19 NOTE — Progress Notes (Signed)
Chief Complaint  Patient presents with   Follow-up    Rm 16, alone. Here for 6 month MS f/u, currently of DMT for MS. Pt reports chronic nausea last 6 month, taking zofran for relief. Recent labs shows low Vit D, taking supplements. L arm numbness while sleeping, driving, or when on the phone.     HISTORY OF PRESENT ILLNESS:  04/19/21 ALL:  Ashley May is a 51 y.o. female here today for follow up for MS and excessive daytime sleepiness. Not on DMT. She remains on Nuvigil 250 mg daily. Since starting this, she has experienced some chronic nausea. She would like to try switching back to Adderall.   She denies any changes in her gait, balance, bowel, bladder function, or increased weakness. She continues to work full time.  She endorses numbness when she lays down at night in her L arm over the last month. Her MRI in 2019 demonstrated chronic lesion on C2 and degenerative disease at C5-C6.   Patient reports that recently saw a holistic medicine provider who took her Vit. D level and it came back in th 20's. Her last Vit. D in 2021 was 51. We recommend taking 5,000 IU OTC of Vitamin D and we can redraw her Vit. D level.   She mentions some insomnia, but hasn't tried any OTC medications. I recommend trying 5mg  of melatonin OTC and let us know if this doesn't improve.   HISTORY (copied from previous note) 10/10/20 ALL She returns for follow up. She is doing fairly well. No new or exacerbating symptoms. She has been off DMT for many years. MRIs have been stable. She denies new or worsening symptoms with exception of fatigue and daytime sleepiness. She does not feel Adderall is helping any longer. She tried Nuvigil years ago and remembers doing well on this medication. 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 She has a hard time getting to sleep. She ca not tolerate sleep aids due to waking at 3:30 am to go to work. She usually sleeps about 6  hours a night.   REVIEW OF SYSTEMS: Out of a complete 14 system review of symptoms, the patient complains only of the following symptoms, nausea, numbness tingling, and all other reviewed systems are negative.   ALLERGIES: Allergies  Allergen Reactions   Prochlorperazine Edisylate Anaphylaxis   Eggs Or Egg-Derived Products Nausea And Vomiting   Gadolinium Derivatives Nausea And Vomiting   Morphine Itching     HOME MEDICATIONS: Outpatient Medications Prior to Visit  Medication Sig Dispense Refill   Armodafinil (NUVIGIL) 250 MG tablet Take 1 tablet by mouth daily. 90 tablet 1   cholecalciferol (VITAMIN D) 1000 units tablet Take 8,000 Units by mouth daily.      Multiple Vitamin (MULTIVITAMIN) tablet Take 1 tablet by mouth daily.     ondansetron (ZOFRAN-ODT) 8 MG disintegrating tablet DISSOLVE 1 TABLET BY MOUTH EVERY 8 HOURS AS NEEDED FOR NAUSEA OR VOMITING. 20 tablet 5   rizatriptan (MAXALT-MLT) 10 MG disintegrating tablet DISSOLVE 1 TABLET BY MOUTH AS NEEDED FOR MIGRAINE, MAY REPEAT IN 2 HOURS AS NEEDED 9 tablet 11   topiramate (TOPAMAX) 50 MG tablet TAKE 2-3 TABLETS BY MOUTH AT BEDTIME 270 tablet 4   gabapentin (NEURONTIN) 300 MG capsule TAKE 1 TO 2 CAPSULES BY MOUTH AT BEDTIME 180 capsule 3   No facility-administered medications prior to visit.     PAST MEDICAL HISTORY: Past Medical History:  Diagnosis Date   Chondromalacia of left patella 41/7408   Complication of anesthesia    slow to awaken after anesthesia for wisdom teeth extraction   Family history of adverse reaction to anesthesia    pt's father has hx. of being hard to wake up post-op   Lateral meniscus tear 10/2016   left   Meningioma (HCC)    right frontal   Migraines    Multiple sclerosis (HCC)    PONV (postoperative nausea and vomiting)    PVC's (premature ventricular contractions)    no current med.   Vitamin D deficiency      PAST SURGICAL HISTORY: Past Surgical History:  Procedure Laterality Date    AUGMENTATION MAMMAPLASTY Bilateral    1994   BREAST BIOPSY Right 08/14/2016   BREAST ENHANCEMENT SURGERY     CHONDROPLASTY Left 10/31/2016   Procedure: LEFT KNEE CHONDROPLASTY;  Surgeon: Renette Butters, MD;  Location: [facility];  Service: Orthopedics;  Laterality: Left;   ESOPHAGOGASTRODUODENOSCOPY (EGD) WITH PROPOFOL N/A 07/05/2015   Procedure: ESOPHAGOGASTRODUODENOSCOPY (EGD) WITH PROPOFOL;  Surgeon: Arta Silence, MD;  Location: [facility];  Service: Endoscopy;  Laterality: N/A;   KNEE ARTHROSCOPY WITH LATERAL MENISECTOMY Left 10/31/2016   Procedure: LEFT KNEE ARTHROSCOPY WITH LATERAL MENISCECTOMY;  Surgeon: Renette Butters, MD;  Location: [facility];  Service: Orthopedics;  Laterality: Left;   KNEE HARDWARE REMOVAL Right 11/30/2009   with debridement of scar tissue   ORIF PROXIMAL TIBIAL PLATEAU FRACTURE Right 06/01/2009   TOTAL VAGINAL HYSTERECTOMY  05/22/2000   WISDOM TOOTH EXTRACTION       FAMILY HISTORY: Family History  Problem Relation Age of Onset   Heart disease Paternal Grandfather    Stomach cancer Paternal Grandmother    Colon cancer Paternal Grandmother    Heart murmur Father    Heart disease Father        TAVR, pacemaker   Heart failure Father    Anesthesia problems Father        hard to wake up post-op     SOCIAL HISTORY: Social History   Socioeconomic History   Marital status: Married    Spouse name: Not on file   Number of children: 2   Years of education: Not on file   Highest education level: Not on file  Occupational History   Occupation: MRI Librarian, academic at [employer]: [place] Use   Smoking status: Never   Smokeless tobacco: Never  Vaping Use   Vaping Use: Never used  Substance and Sexual Activity   Alcohol use: No    Alcohol/week: 0.0 standard drinks   Drug use: No   Sexual activity: Not on file  Other Topics Concern   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   Married.     Social Determinants of Health   Financial Resource Strain: Not on file  Food Insecurity: Not on file  Transportation Needs: Not on file  Physical Activity: Not on file  Stress: Not on file  Social Connections: Not on file  Intimate Partner Violence: Not on file     PHYSICAL EXAM  Vitals:   04/19/21 1511  BP: 125/75  Pulse: (!) 102  Weight: 61.7 kg  Height: 5\' 8"  (1.727 m)   Body mass index is 20.68 kg/m.  Generalized: Well developed, in no acute distress  Cardiology: normal rate and rhythm, no murmur auscultated  Respiratory: clear to auscultation bilaterally  Neurological examination  Mentation: Alert oriented to time, place, history taking. Follows all commands speech and language fluent Cranial nerve II-XII: Pupils were equal round reactive to light. Extraocular movements were full, visual field were full on confrontational test. Facial sensation and strength were normal. Head turning and shoulder shrug  were normal and symmetric. Motor: The motor testing reveals 5 over 5 strength of all 4 extremities. Good symmetric motor tone is noted throughout.  Sensory: Sensory testing is intact to soft touch on all 4 extremities. No evidence of extinction is noted.  Coordination: Cerebellar testing reveals good finger-nose-finger and heel-to-shin bilaterally.  Gait and station: Gait is normal.  Reflexes: Deep tendon reflexes are symmetric and normal bilaterally.    DIAGNOSTIC DATA (LABS, IMAGING, TESTING) - I reviewed patient records, labs, notes, testing and imaging myself where available.  Lab Results  Component Value Date   WBC 12.0 (H) 03/01/2021   HGB 13.4 03/01/2021   HCT 40.4 03/01/2021   MCV 88.4 03/01/2021   PLT 352 03/01/2021      Component Value Date/Time   NA 137 03/01/2021 1210   NA 139 11/04/2019 1636   K 3.7 03/01/2021 1210   CL 103 03/01/2021 1210   CO2 28 03/01/2021 1210   GLUCOSE 86 03/01/2021 1210   BUN 19 03/01/2021 1210   BUN 17  11/04/2019 1636   CREATININE 0.61 03/01/2021 1210   CALCIUM 8.9 03/01/2021 1210   PROT 6.9 03/01/2021 1210   PROT 6.9 11/04/2019 1636   ALBUMIN 3.9 03/01/2021 1210   ALBUMIN 4.3 11/04/2019 1636   AST 24 03/01/2021 1210   ALT 19 03/01/2021 1210   ALKPHOS 68 03/01/2021 1210   BILITOT 0.4 03/01/2021 1210   BILITOT <0.2 11/04/2019 1636   GFRNONAA >60 03/01/2021 1210   GFRAA 125 11/04/2019 1636   Lab Results  Component Value Date   CHOL 166 11/04/2018   HDL 48.20 11/04/2018   LDLCALC 101 (H) 11/04/2018   TRIG 84.0 11/04/2018   CHOLHDL 3 11/04/2018   No results found for: HGBA1C No results found for: VITAMINB12 Lab Results  Component Value Date   TSH 1.35 11/04/2018    No flowsheet data found.   No flowsheet data found.   ASSESSMENT AND PLAN  51 y.o. year old female  has a past medical history of Chondromalacia of left patella (41/6606), Complication of anesthesia, Family history of adverse reaction to anesthesia, Lateral meniscus tear (10/2016), Meningioma ([place]), Migraines, Multiple sclerosis ([place]), PONV (postoperative nausea and vomiting), PVC's (premature ventricular contractions), and Vitamin D deficiency. here with    Multiple sclerosis (HCC)  Excessive daytime sleepiness  Common migraine without intractability  Other fatigue  Vitamin D deficiency  Insomnia, unspecified type  We will switch back to Adderall 30 MG XR daily. Nuvigil caused nausea Try taking melatonin for your insomnia.  Take 5,000 IU OTC of Vitamin D daily We have ordered an MRI brain and cervical spine for MS follow up.  Follow up with Dr. Felecia Shelling in 1 year or sooner if needed  No orders of the defined types were placed in this encounter.    No orders of the defined types were placed in this encounter.   Debbora Presto, MSN, FNP-C 04/19/2021, 4:05 PM  [facility] [address], [place], [place] [address] [phone]

## 2021-04-20 ENCOUNTER — Ambulatory Visit: Payer: 59 | Admitting: Internal Medicine

## 2021-04-20 DIAGNOSIS — M1712 Unilateral primary osteoarthritis, left knee: Secondary | ICD-10-CM | POA: Diagnosis not present

## 2021-04-20 DIAGNOSIS — M25662 Stiffness of left knee, not elsewhere classified: Secondary | ICD-10-CM | POA: Diagnosis not present

## 2021-04-20 DIAGNOSIS — M25562 Pain in left knee: Secondary | ICD-10-CM | POA: Diagnosis not present

## 2021-04-24 ENCOUNTER — Telehealth: Payer: Self-pay | Admitting: Family Medicine

## 2021-04-24 NOTE — Telephone Encounter (Signed)
Ashley May  [facility] no Josem Kaufmann req - Patient is employee (Radio broadcast assistant). 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

## 2021-04-30 ENCOUNTER — Other Ambulatory Visit (HOSPITAL_COMMUNITY): Payer: Self-pay

## 2021-04-30 ENCOUNTER — Encounter: Payer: Self-pay | Admitting: Family Medicine

## 2021-04-30 DIAGNOSIS — M25662 Stiffness of left knee, not elsewhere classified: Secondary | ICD-10-CM | POA: Diagnosis not present

## 2021-04-30 DIAGNOSIS — M25562 Pain in left knee: Secondary | ICD-10-CM | POA: Diagnosis not present

## 2021-04-30 MED ORDER — LORAZEPAM 0.5 MG PO TABS
0.5000 mg | ORAL_TABLET | Freq: Three times a day (TID) | ORAL | 0 refills | Status: DC
  Filled 2021-04-30: qty 2, 1d supply, fill #0

## 2021-05-02 ENCOUNTER — Encounter (HOSPITAL_COMMUNITY): Payer: Self-pay

## 2021-05-02 ENCOUNTER — Other Ambulatory Visit: Payer: Self-pay

## 2021-05-02 ENCOUNTER — Other Ambulatory Visit: Payer: Self-pay | Admitting: Family Medicine

## 2021-05-02 ENCOUNTER — Encounter: Payer: Self-pay | Admitting: Family Medicine

## 2021-05-02 ENCOUNTER — Ambulatory Visit (HOSPITAL_COMMUNITY)
Admission: RE | Admit: 2021-05-02 | Discharge: 2021-05-02 | Disposition: A | Discharge status: Home / Self Care | Payer: 59 | Source: Ambulatory Visit | Attending: Family Medicine | Admitting: Family Medicine

## 2021-05-02 DIAGNOSIS — H9190 Unspecified hearing loss, unspecified ear: Secondary | ICD-10-CM | POA: Diagnosis not present

## 2021-05-02 DIAGNOSIS — G9389 Other specified disorders of brain: Secondary | ICD-10-CM | POA: Diagnosis not present

## 2021-05-02 DIAGNOSIS — R200 Anesthesia of skin: Secondary | ICD-10-CM | POA: Diagnosis not present

## 2021-05-02 DIAGNOSIS — D329 Benign neoplasm of meninges, unspecified: Secondary | ICD-10-CM

## 2021-05-02 DIAGNOSIS — M4802 Spinal stenosis, cervical region: Secondary | ICD-10-CM

## 2021-05-02 DIAGNOSIS — M2578 Osteophyte, vertebrae: Secondary | ICD-10-CM | POA: Diagnosis not present

## 2021-05-02 DIAGNOSIS — G35 Multiple sclerosis: Secondary | ICD-10-CM | POA: Insufficient documentation

## 2021-05-02 MED ORDER — GADOBUTROL 1 MMOL/ML IV SOLN
6.0000 mL | Freq: Once | INTRAVENOUS | Status: AC | PRN
  Administered 2021-05-02: 6 mL via INTRAVENOUS

## 2021-05-02 NOTE — Progress Notes (Signed)
Ref NS

## 2021-05-03 ENCOUNTER — Telehealth: Payer: Self-pay | Admitting: Family Medicine

## 2021-05-03 ENCOUNTER — Other Ambulatory Visit: Payer: Self-pay | Admitting: Family Medicine

## 2021-05-03 DIAGNOSIS — M4802 Spinal stenosis, cervical region: Secondary | ICD-10-CM

## 2021-05-03 DIAGNOSIS — D329 Benign neoplasm of meninges, unspecified: Secondary | ICD-10-CM

## 2021-05-03 NOTE — Telephone Encounter (Signed)
Sent to [facility] ph # [phone]. 

## 2021-05-03 NOTE — Telephone Encounter (Signed)
Patient would like to go to [facility] to see Dr. Wynona Canes. I  sent the referral ph # [phone]

## 2021-05-04 ENCOUNTER — Encounter: Payer: Self-pay | Admitting: Family Medicine

## 2021-05-04 DIAGNOSIS — D320 Benign neoplasm of cerebral meninges: Secondary | ICD-10-CM | POA: Diagnosis not present

## 2021-05-08 DIAGNOSIS — M25562 Pain in left knee: Secondary | ICD-10-CM | POA: Diagnosis not present

## 2021-05-08 DIAGNOSIS — M25662 Stiffness of left knee, not elsewhere classified: Secondary | ICD-10-CM | POA: Diagnosis not present

## 2021-05-09 ENCOUNTER — Encounter: Payer: Self-pay | Admitting: Family Medicine

## 2021-05-22 DIAGNOSIS — D329 Benign neoplasm of meninges, unspecified: Secondary | ICD-10-CM | POA: Diagnosis not present

## 2021-05-22 DIAGNOSIS — Z23 Encounter for immunization: Secondary | ICD-10-CM | POA: Diagnosis not present

## 2021-05-22 DIAGNOSIS — D320 Benign neoplasm of cerebral meninges: Secondary | ICD-10-CM | POA: Diagnosis not present

## 2021-06-01 ENCOUNTER — Other Ambulatory Visit (HOSPITAL_COMMUNITY): Payer: Self-pay

## 2021-06-01 ENCOUNTER — Other Ambulatory Visit: Payer: Self-pay | Admitting: Family Medicine

## 2021-06-04 ENCOUNTER — Other Ambulatory Visit (HOSPITAL_COMMUNITY): Payer: Self-pay

## 2021-06-04 MED ORDER — AMPHETAMINE-DEXTROAMPHET ER 30 MG PO CP24
30.0000 mg | ORAL_CAPSULE | Freq: Every day | ORAL | 0 refills | Status: DC
  Filled 2021-06-04: qty 30, 30d supply, fill #0

## 2021-06-04 NOTE — Telephone Encounter (Signed)
Last OV was on 04/19/21.  ?Next OV is not scheduled.  ?Last RX was written on 04/19/21 for 30 tabs.  ? ?[place] Drug Database has been reviewed.  ?

## 2021-06-21 ENCOUNTER — Telehealth: Payer: 59 | Admitting: Family Medicine

## 2021-06-21 ENCOUNTER — Other Ambulatory Visit (HOSPITAL_COMMUNITY): Payer: Self-pay

## 2021-06-21 DIAGNOSIS — R300 Dysuria: Secondary | ICD-10-CM

## 2021-06-21 MED ORDER — NITROFURANTOIN MONOHYD MACRO 100 MG PO CAPS
100.0000 mg | ORAL_CAPSULE | Freq: Two times a day (BID) | ORAL | 0 refills | Status: AC
  Filled 2021-06-21: qty 10, 5d supply, fill #0

## 2021-06-21 NOTE — Patient Instructions (Signed)
Urinary Tract Infection, Adult A urinary tract infection (UTI) is an infection of any part of the urinary tract. The urinary tract includes the kidneys, ureters, bladder, and urethra. These organs make, store, and get rid of urine in the body. An upper UTI affects the ureters and kidneys. A lower UTI affects the bladder and urethra. What are the causes? Most urinary tract infections are caused by bacteria in your genital area around your urethra, where urine leaves your body. These bacteria grow and cause inflammation of your urinary tract. What increases the risk? You are more likely to develop this condition if: You have a urinary catheter that stays in place. You are not able to control when you urinate or have a bowel movement (incontinence). You are female and you: Use a spermicide or diaphragm for birth control. Have low estrogen levels. Are pregnant. You have certain genes that increase your risk. You are sexually active. You take antibiotic medicines. You have a condition that causes your flow of urine to slow down, such as: An enlarged prostate, if you are female. Blockage in your urethra. A kidney stone. A nerve condition that affects your bladder control (neurogenic bladder). Not getting enough to drink, or not urinating often. You have certain medical conditions, such as: Diabetes. A weak disease-fighting system (immunesystem). Sickle cell disease. Gout. Spinal cord injury. What are the signs or symptoms? Symptoms of this condition include: Needing to urinate right away (urgency). Frequent urination. This may include small amounts of urine each time you urinate. Pain or burning with urination. Blood in the urine. Urine that smells bad or unusual. Trouble urinating. Cloudy urine. Vaginal discharge, if you are female. Pain in the abdomen or the lower back. You may also have: Vomiting or a decreased appetite. Confusion. Irritability or tiredness. A fever or  chills. Diarrhea. The first symptom in older adults may be confusion. In some cases, they may not have any symptoms until the infection has worsened. How is this diagnosed? This condition is diagnosed based on your medical history and a physical exam. You may also have other tests, including: Urine tests. Blood tests. Tests for STIs (sexually transmitted infections). If you have had more than one UTI, a cystoscopy or imaging studies may be done to determine the cause of the infections. How is this treated? Treatment for this condition includes: Antibiotic medicine. Over-the-counter medicines to treat discomfort. Drinking enough water to stay hydrated. If you have frequent infections or have other conditions such as a kidney stone, you may need to see a health care provider who specializes in the urinary tract (urologist). In rare cases, urinary tract infections can cause sepsis. Sepsis is a life-threatening condition that occurs when the body responds to an infection. Sepsis is treated in the hospital with IV antibiotics, fluids, and other medicines. Follow these instructions at home: Medicines Take over-the-counter and prescription medicines only as told by your health care provider. If you were prescribed an antibiotic medicine, take it as told by your health care provider. Do not stop using the antibiotic even if you start to feel better. General instructions Make sure you: Empty your bladder often and completely. Do not hold urine for long periods of time. Empty your bladder after sex. Wipe from front to back after urinating or having a bowel movement if you are female. Use each tissue only one time when you wipe. Drink enough fluid to keep your urine pale yellow. Keep all follow-up visits. This is important. Contact a health care provider   if: Your symptoms do not get better after 1-2 days. Your symptoms go away and then return. Get help right away if: You have severe pain in your  back or your lower abdomen. You have a fever or chills. You have nausea or vomiting. Summary A urinary tract infection (UTI) is an infection of any part of the urinary tract, which includes the kidneys, ureters, bladder, and urethra. Most urinary tract infections are caused by bacteria in your genital area. Treatment for this condition often includes antibiotic medicines. If you were prescribed an antibiotic medicine, take it as told by your health care provider. Do not stop using the antibiotic even if you start to feel better. Keep all follow-up visits. This is important. This information is not intended to replace advice given to you by your health care provider. Make sure you discuss any questions you have with your health care provider. Document Revised: 10/22/2019 Document Reviewed: 10/22/2019 Elsevier Patient Education  2022 Elsevier Inc.  

## 2021-06-21 NOTE — Progress Notes (Signed)
?Virtual Visit Consent  ? ?Ashley May, you are scheduled for a virtual visit with a [facility] provider today.   ?  ?Just as with appointments in the office, your consent must be obtained to participate.  Your consent will be active for this visit and any virtual visit you may have with one of our providers in the next 365 days.   ?  ?If you have a MyChart account, a copy of this consent can be sent to you electronically.  All virtual visits are billed to your insurance company just like a traditional visit in the office.   ? ?As this is a virtual visit, video technology does not allow for your provider to perform a traditional examination.  This may limit your provider's ability to fully assess your condition.  If your provider identifies any concerns that need to be evaluated in person or the need to arrange testing (such as labs, EKG, etc.), we will make arrangements to do so.   ?  ?Although advances in technology are sophisticated, we cannot ensure that it will always work on either your end or our end.  If the connection with a video visit is poor, the visit may have to be switched to a telephone visit.  With either a video or telephone visit, we are not always able to ensure that we have a secure connection.    ? ?I need to obtain your verbal consent now.   Are you willing to proceed with your visit today?  ?  ?Ashley May has provided verbal consent on 06/21/2021 for a virtual visit (video or telephone). ?  ?Ashley Mayo, NP  ? ?Date: 06/21/2021 9:17 AM ? ? ?Virtual Visit via Video Note  ? ?Ashley May, connected with  Ashley May  ([id number], 09/24/1970) on 06/21/21 at  9:15 AM EDT by a video-enabled telemedicine application and verified that I am speaking with the correct person using two identifiers. ? ?Location: ?Patient: Virtual Visit Location Patient: Home ?Provider: Virtual Visit Location Provider: Home Office ?  ?I discussed the limitations of evaluation and management by  telemedicine and the availability of in person appointments. The patient expressed understanding and agreed to proceed.   ? ?History of Present Illness: ?Ashley May is a 51 y.o. who identifies as a female who was assigned female at birth, and is being seen today for UTI symptoms. ? ?HPI: Urinary Tract Infection  ?This is a new problem. The current episode started in the past 7 days. The problem occurs every urination. The problem has been gradually worsening. The quality of the pain is described as burning. There has been no fever. She is Sexually active. There is No history of pyelonephritis. Associated symptoms include frequency and urgency. Pertinent negatives include no chills, discharge, flank pain, hematuria, hesitancy, nausea, possible pregnancy, sweats or vomiting. Treatments tried: urostat. The treatment provided mild relief. Her past medical history is significant for recurrent UTIs. occasionally over the years   ?Problems:  ?Patient Active Problem List  ? Diagnosis Date Noted  ? Chronic nausea 03/01/2021  ? Pain around toenail 07/04/2019  ? Infected nailbed of toe 06/29/2019  ? Educated about COVID-19 virus infection 04/07/2019  ? LLQ pain 11/08/2018  ? Lhermitte's sign positive 09/04/2017  ? Vertigo 03/28/2017  ? Sinusitis 03/16/2017  ? Insomnia 10/07/2016  ? Other fatigue 10/07/2016  ? Health care maintenance 02/03/2015  ? Paresthesia 08/04/2014  ? Common migraine without intractability 08/04/2014  ? Sinusitis, chronic  08/04/2014  ? PVCs (premature ventricular contractions) 01/26/2014  ? Chronic cough 11/05/2010  ? HYPOKALEMIA 02/21/2010  ? Meningioma ([place]) 02/20/2010  ? Multiple sclerosis ([place]) 02/20/2010  ? PALPITATIONS 02/20/2010  ? SUPRAVENTRICULAR TACHYCARDIA, HX OF 02/20/2010  ?  ?Allergies:  ?Allergies  ?Allergen Reactions  ? Prochlorperazine Edisylate Anaphylaxis  ? Eggs Or Egg-Derived Products Nausea And Vomiting  ? Gadolinium Derivatives Nausea And Vomiting  ? Morphine Itching   ? ?Medications:  ?Current Outpatient Medications:  ?  amphetamine-dextroamphetamine (ADDERALL XR) 30 MG 24 hr capsule, Take 1 capsule by mouth daily., Disp: 30 capsule, Rfl: 0 ?  cholecalciferol (VITAMIN D) 1000 units tablet, Take 8,000 Units by mouth daily. , Disp: , Rfl:  ?  LORazepam (ATIVAN) 0.5 MG tablet, Take 1 tablet (0.5 mg total) by mouth every 8 (eight) hours., Disp: 2 tablet, Rfl: 0 ?  Multiple Vitamin (MULTIVITAMIN) tablet, Take 1 tablet by mouth daily., Disp: , Rfl:  ?  ondansetron (ZOFRAN-ODT) 8 MG disintegrating tablet, DISSOLVE 1 TABLET BY MOUTH EVERY 8 HOURS AS NEEDED FOR NAUSEA OR VOMITING., Disp: 20 tablet, Rfl: 5 ?  rizatriptan (MAXALT-MLT) 10 MG disintegrating tablet, DISSOLVE 1 TABLET BY MOUTH AS NEEDED FOR MIGRAINE, MAY REPEAT IN 2 HOURS AS NEEDED, Disp: 9 tablet, Rfl: 11 ?  topiramate (TOPAMAX) 50 MG tablet, TAKE 2-3 TABLETS BY MOUTH AT BEDTIME, Disp: 270 tablet, Rfl: 4 ? ?Observations/Objective: ?Patient is well-developed, well-nourished in no acute distress.  ?Resting comfortably  at home.  ?Head is normocephalic, atraumatic.  ?No labored breathing.  ?Speech is clear and coherent with logical content.  ?Patient is alert and oriented at baseline.  ? ? ?Assessment and Plan: ?1. Dysuria ? ?- nitrofurantoin, macrocrystal-monohydrate, (MACROBID) 100 MG capsule; Take 1 capsule (100 mg total) by mouth 2 (two) times daily for 5 days.  Dispense: 10 capsule; Refill: 0 ? ?UTI S&S will treat with macrobid and provide info on prevention on avs as well as discussed. ? ? Reviewed side effects, risks and benefits of medication.   ? ?Patient acknowledged agreement and understanding of the plan.  ? ? ? ?Follow Up Instructions: ?I discussed the assessment and treatment plan with the patient. The patient was provided an opportunity to ask questions and all were answered. The patient agreed with the plan and demonstrated an understanding of the instructions.  A copy of instructions were sent to the patient via  MyChart unless otherwise noted below.  ? ? ?The patient was advised to call back or seek an in-person evaluation if the symptoms worsen or if the condition fails to improve as anticipated. ? ?Time:  ?I spent 10 minutes with the patient via telehealth technology discussing the above problems/concerns.   ? ?Ashley Mayo, NP ? ?

## 2021-06-24 ENCOUNTER — Other Ambulatory Visit: Payer: Self-pay | Admitting: Neurology

## 2021-06-25 ENCOUNTER — Other Ambulatory Visit (HOSPITAL_COMMUNITY): Payer: Self-pay

## 2021-06-25 MED ORDER — RIZATRIPTAN BENZOATE 10 MG PO TBDP
ORAL_TABLET | ORAL | 11 refills | Status: DC
  Filled 2021-06-25: qty 9, 30d supply, fill #0
  Filled 2021-08-29: qty 9, 30d supply, fill #1
  Filled 2021-09-18: qty 9, 30d supply, fill #2
  Filled 2021-11-17: qty 9, 30d supply, fill #3
  Filled 2021-12-31: qty 9, 30d supply, fill #4
  Filled 2022-02-06: qty 9, 30d supply, fill #5
  Filled 2022-03-12: qty 9, 30d supply, fill #6
  Filled 2022-05-01: qty 9, 30d supply, fill #7

## 2021-07-16 ENCOUNTER — Other Ambulatory Visit (HOSPITAL_COMMUNITY): Payer: Self-pay

## 2021-07-16 ENCOUNTER — Other Ambulatory Visit: Payer: Self-pay | Admitting: Neurology

## 2021-07-17 ENCOUNTER — Other Ambulatory Visit (HOSPITAL_COMMUNITY): Payer: Self-pay

## 2021-07-17 MED ORDER — AMPHETAMINE-DEXTROAMPHET ER 30 MG PO CP24
30.0000 mg | ORAL_CAPSULE | Freq: Every day | ORAL | 0 refills | Status: DC
  Filled 2021-07-17: qty 30, 30d supply, fill #0

## 2021-07-17 NOTE — Telephone Encounter (Signed)
Last OV was on 04/19/21.  ?Next OV is pending to be scheduled.  ?Last RX was written on 06/04/21 for 30 tabs.  ? ?[place] Drug Database has been reviewed.  ?

## 2021-08-09 ENCOUNTER — Other Ambulatory Visit (HOSPITAL_COMMUNITY): Payer: Self-pay

## 2021-08-16 ENCOUNTER — Ambulatory Visit
Admission: RE | Admit: 2021-08-16 | Discharge: 2021-08-16 | Disposition: A | Discharge status: Home / Self Care | Payer: PRIVATE HEALTH INSURANCE | Source: Ambulatory Visit | Attending: Physician Assistant | Admitting: Physician Assistant

## 2021-08-16 ENCOUNTER — Other Ambulatory Visit: Payer: Self-pay | Admitting: Physician Assistant

## 2021-08-16 ENCOUNTER — Ambulatory Visit
Admission: RE | Admit: 2021-08-16 | Discharge: 2021-08-16 | Disposition: A | Discharge status: Home / Self Care | Payer: PRIVATE HEALTH INSURANCE | Attending: Physician Assistant | Admitting: Physician Assistant

## 2021-08-16 DIAGNOSIS — M79671 Pain in right foot: Secondary | ICD-10-CM | POA: Insufficient documentation

## 2021-08-16 DIAGNOSIS — Y990 Civilian activity done for income or pay: Secondary | ICD-10-CM | POA: Diagnosis not present

## 2021-08-16 DIAGNOSIS — M7989 Other specified soft tissue disorders: Secondary | ICD-10-CM | POA: Insufficient documentation

## 2021-08-29 ENCOUNTER — Other Ambulatory Visit (HOSPITAL_COMMUNITY): Payer: Self-pay

## 2021-08-29 ENCOUNTER — Other Ambulatory Visit: Payer: Self-pay | Admitting: Neurology

## 2021-08-29 MED ORDER — AMPHETAMINE-DEXTROAMPHET ER 30 MG PO CP24
30.0000 mg | ORAL_CAPSULE | Freq: Every day | ORAL | 0 refills | Status: DC
  Filled 2021-08-29: qty 30, 30d supply, fill #0

## 2021-08-29 NOTE — Telephone Encounter (Signed)
Last OV was on 04/19/21.  Next OV is pending to be scheduled.  Last RX was written on 07/17/21 for 30 tabs.   [place] Drug Database has been reviewed.

## 2021-08-31 DIAGNOSIS — M1712 Unilateral primary osteoarthritis, left knee: Secondary | ICD-10-CM | POA: Diagnosis not present

## 2021-09-18 ENCOUNTER — Other Ambulatory Visit (HOSPITAL_COMMUNITY): Payer: Self-pay

## 2021-09-19 ENCOUNTER — Other Ambulatory Visit (HOSPITAL_COMMUNITY): Payer: Self-pay

## 2021-10-10 ENCOUNTER — Other Ambulatory Visit (HOSPITAL_COMMUNITY): Payer: Self-pay

## 2021-10-10 ENCOUNTER — Other Ambulatory Visit: Payer: Self-pay | Admitting: Neurology

## 2021-10-11 NOTE — Telephone Encounter (Signed)
Last OV was on 04/19/21.  Next OV is pending to be scheduled.  Last RX was written on 08/29/21 for 30 tabs.   [place] Drug Database has been reviewed.

## 2021-10-12 MED ORDER — AMPHETAMINE-DEXTROAMPHET ER 30 MG PO CP24
30.0000 mg | ORAL_CAPSULE | Freq: Every day | ORAL | 0 refills | Status: DC
  Filled 2021-10-12: qty 30, 30d supply, fill #0

## 2021-10-13 ENCOUNTER — Other Ambulatory Visit (HOSPITAL_COMMUNITY): Payer: Self-pay

## 2021-10-30 ENCOUNTER — Other Ambulatory Visit (HOSPITAL_COMMUNITY): Payer: Self-pay

## 2021-10-30 MED ORDER — LORAZEPAM 0.5 MG PO TABS
ORAL_TABLET | ORAL | 0 refills | Status: DC
  Filled 2021-10-30: qty 2, 1d supply, fill #0

## 2021-11-01 ENCOUNTER — Other Ambulatory Visit: Payer: Self-pay | Admitting: Neurology

## 2021-11-01 ENCOUNTER — Other Ambulatory Visit (HOSPITAL_COMMUNITY): Payer: Self-pay

## 2021-11-01 MED ORDER — ONDANSETRON 8 MG PO TBDP
ORAL_TABLET | ORAL | 5 refills | Status: AC
  Filled 2021-11-01: qty 20, 7d supply, fill #0
  Filled 2021-11-17: qty 20, 7d supply, fill #1
  Filled 2021-12-31: qty 20, 7d supply, fill #2
  Filled 2022-03-12: qty 20, 7d supply, fill #3
  Filled 2022-07-09: qty 20, 7d supply, fill #4
  Filled 2022-09-21: qty 20, 7d supply, fill #5

## 2021-11-06 ENCOUNTER — Ambulatory Visit (HOSPITAL_COMMUNITY)
Admission: RE | Admit: 2021-11-06 | Discharge: 2021-11-06 | Disposition: A | Discharge status: Home / Self Care | Payer: 59 | Source: Ambulatory Visit | Attending: Neurosurgery | Admitting: Neurosurgery

## 2021-11-06 ENCOUNTER — Other Ambulatory Visit (HOSPITAL_COMMUNITY): Payer: Self-pay | Admitting: Neurosurgery

## 2021-11-06 DIAGNOSIS — G9389 Other specified disorders of brain: Secondary | ICD-10-CM | POA: Diagnosis not present

## 2021-11-06 DIAGNOSIS — D429 Neoplasm of uncertain behavior of meninges, unspecified: Secondary | ICD-10-CM

## 2021-11-06 DIAGNOSIS — G35 Multiple sclerosis: Secondary | ICD-10-CM | POA: Insufficient documentation

## 2021-11-06 DIAGNOSIS — G35D Multiple sclerosis, unspecified: Secondary | ICD-10-CM

## 2021-11-06 MED ORDER — GADOBUTROL 1 MMOL/ML IV SOLN
6.0000 mL | Freq: Once | INTRAVENOUS | Status: AC | PRN
  Administered 2021-11-06: 6 mL via INTRAVENOUS

## 2021-11-17 ENCOUNTER — Other Ambulatory Visit (HOSPITAL_COMMUNITY): Payer: Self-pay

## 2021-11-17 ENCOUNTER — Other Ambulatory Visit: Payer: Self-pay | Admitting: Neurology

## 2021-11-19 MED ORDER — AMPHETAMINE-DEXTROAMPHET ER 30 MG PO CP24
30.0000 mg | ORAL_CAPSULE | Freq: Every day | ORAL | 0 refills | Status: DC
  Filled 2021-11-19: qty 30, 30d supply, fill #0

## 2021-11-20 ENCOUNTER — Other Ambulatory Visit (HOSPITAL_COMMUNITY): Payer: Self-pay

## 2021-11-20 DIAGNOSIS — D429 Neoplasm of uncertain behavior of meninges, unspecified: Secondary | ICD-10-CM | POA: Diagnosis not present

## 2021-11-20 DIAGNOSIS — D329 Benign neoplasm of meninges, unspecified: Secondary | ICD-10-CM | POA: Diagnosis not present

## 2021-12-17 DIAGNOSIS — D320 Benign neoplasm of cerebral meninges: Secondary | ICD-10-CM | POA: Diagnosis not present

## 2021-12-28 ENCOUNTER — Other Ambulatory Visit: Payer: Self-pay | Admitting: Internal Medicine

## 2021-12-28 DIAGNOSIS — Z1231 Encounter for screening mammogram for malignant neoplasm of breast: Secondary | ICD-10-CM

## 2021-12-31 ENCOUNTER — Other Ambulatory Visit (HOSPITAL_COMMUNITY): Payer: Self-pay

## 2021-12-31 ENCOUNTER — Other Ambulatory Visit: Payer: Self-pay | Admitting: Neurology

## 2021-12-31 ENCOUNTER — Other Ambulatory Visit: Payer: Self-pay

## 2021-12-31 MED ORDER — AMPHETAMINE-DEXTROAMPHET ER 30 MG PO CP24
30.0000 mg | ORAL_CAPSULE | Freq: Every day | ORAL | 0 refills | Status: DC
  Filled 2021-12-31: qty 30, 30d supply, fill #0

## 2021-12-31 MED ORDER — TOPIRAMATE 50 MG PO TABS
100.0000 mg | ORAL_TABLET | Freq: Every day | ORAL | 4 refills | Status: DC
Start: 2021-12-31 — End: 2023-11-20
  Filled 2021-12-31: qty 270, 90d supply, fill #0
  Filled 2022-07-09: qty 270, 90d supply, fill #1
  Filled 2022-09-03: qty 270, 90d supply, fill #2

## 2021-12-31 NOTE — Telephone Encounter (Signed)
Pt is due for Follow Up and pt hasn't sch appt. Pt is needing appt for refill on her adderall.

## 2022-01-01 ENCOUNTER — Telehealth: Payer: Self-pay | Admitting: *Deleted

## 2022-01-01 ENCOUNTER — Encounter: Payer: Self-pay | Admitting: Family Medicine

## 2022-01-01 NOTE — Telephone Encounter (Signed)
I called pt left voice mail.

## 2022-01-07 ENCOUNTER — Encounter: Payer: Self-pay | Admitting: *Deleted

## 2022-01-29 ENCOUNTER — Ambulatory Visit: Payer: 59

## 2022-02-06 ENCOUNTER — Other Ambulatory Visit (HOSPITAL_COMMUNITY): Payer: Self-pay

## 2022-02-15 ENCOUNTER — Other Ambulatory Visit: Payer: Self-pay | Admitting: Neurology

## 2022-02-15 ENCOUNTER — Other Ambulatory Visit (HOSPITAL_COMMUNITY): Payer: Self-pay

## 2022-02-18 NOTE — Telephone Encounter (Signed)
Please call patient and schedule an appointment. Please let us know when appointment has been scheduled so we can consider adderall refill.

## 2022-02-21 ENCOUNTER — Other Ambulatory Visit (HOSPITAL_COMMUNITY): Payer: Self-pay

## 2022-02-21 ENCOUNTER — Telehealth: Payer: Self-pay | Admitting: Neurology

## 2022-02-21 ENCOUNTER — Other Ambulatory Visit: Payer: Self-pay | Admitting: Neurology

## 2022-02-21 MED ORDER — AMPHETAMINE-DEXTROAMPHET ER 30 MG PO CP24
30.0000 mg | ORAL_CAPSULE | Freq: Every day | ORAL | 0 refills | Status: DC
  Filled 2022-02-21: qty 30, 30d supply, fill #0

## 2022-02-21 NOTE — Telephone Encounter (Signed)
error 

## 2022-02-21 NOTE — Telephone Encounter (Signed)
Called pt. Scheduled appointment for 11/22 with Dr. Felecia Shelling at 8:30 am. Pt is requesting enough medication until appointment.

## 2022-02-25 ENCOUNTER — Ambulatory Visit
Admission: RE | Admit: 2022-02-25 | Discharge: 2022-02-25 | Disposition: A | Discharge status: Home / Self Care | Payer: 59 | Source: Ambulatory Visit | Attending: Internal Medicine | Admitting: Internal Medicine

## 2022-02-25 DIAGNOSIS — Z1231 Encounter for screening mammogram for malignant neoplasm of breast: Secondary | ICD-10-CM | POA: Diagnosis not present

## 2022-02-26 ENCOUNTER — Other Ambulatory Visit (HOSPITAL_COMMUNITY): Payer: Self-pay

## 2022-03-12 ENCOUNTER — Other Ambulatory Visit: Payer: Self-pay | Admitting: Neurology

## 2022-03-13 ENCOUNTER — Other Ambulatory Visit: Payer: Self-pay

## 2022-03-13 ENCOUNTER — Other Ambulatory Visit (HOSPITAL_COMMUNITY): Payer: Self-pay

## 2022-03-13 MED ORDER — AMPHETAMINE-DEXTROAMPHET ER 30 MG PO CP24
30.0000 mg | ORAL_CAPSULE | Freq: Every day | ORAL | 0 refills | Status: DC
  Filled 2022-03-13: qty 30, 30d supply, fill #0

## 2022-03-14 ENCOUNTER — Other Ambulatory Visit (HOSPITAL_COMMUNITY): Payer: Self-pay

## 2022-04-15 ENCOUNTER — Encounter: Payer: Self-pay | Admitting: Internal Medicine

## 2022-04-15 ENCOUNTER — Ambulatory Visit (INDEPENDENT_AMBULATORY_CARE_PROVIDER_SITE_OTHER): Payer: Commercial Managed Care - PPO | Admitting: Neurology

## 2022-04-15 ENCOUNTER — Other Ambulatory Visit (HOSPITAL_COMMUNITY): Payer: Self-pay

## 2022-04-15 ENCOUNTER — Encounter: Payer: Self-pay | Admitting: Neurology

## 2022-04-15 VITALS — BP 109/73 | HR 82 | Ht 68.0 in | Wt 122.0 lb

## 2022-04-15 DIAGNOSIS — G35 Multiple sclerosis: Secondary | ICD-10-CM

## 2022-04-15 DIAGNOSIS — G43909 Migraine, unspecified, not intractable, without status migrainosus: Secondary | ICD-10-CM | POA: Insufficient documentation

## 2022-04-15 DIAGNOSIS — D329 Benign neoplasm of meninges, unspecified: Secondary | ICD-10-CM | POA: Diagnosis not present

## 2022-04-15 MED ORDER — AMPHETAMINE-DEXTROAMPHET ER 30 MG PO CP24
30.0000 mg | ORAL_CAPSULE | Freq: Every day | ORAL | 0 refills | Status: DC
  Filled 2022-04-15: qty 90, 90d supply, fill #0

## 2022-04-15 MED ORDER — IMIPRAMINE HCL 25 MG PO TABS
25.0000 mg | ORAL_TABLET | Freq: Every day | ORAL | 3 refills | Status: DC
  Filled 2022-04-15: qty 90, 90d supply, fill #0
  Filled 2022-06-20 – 2022-07-09 (×2): qty 90, 90d supply, fill #1
  Filled 2022-09-03 – 2022-11-17 (×2): qty 90, 90d supply, fill #2
  Filled 2023-01-16 – 2023-03-02 (×2): qty 90, 90d supply, fill #3

## 2022-04-15 NOTE — Progress Notes (Signed)
[facility]  PATIENT: Ashley May DOB: 1971/02/13  REFERRING DOCTOR OR PCP:  Einar Pheasant SOURCE: patient, records in EMR and MRI images on PACS/CD  _________________________________   HISTORICAL  CHIEF COMPLAINT:  Chief Complaint  Patient presents with   Room 10    Pt is here Alone. Pt states that things have been going goo with her MS.     HISTORY OF PRESENT ILLNESS:  Ashley May is a 52 y.o. woman with relapsing remitting multiple sclerosis.   Update 04/15/2022: She feels her MS is stable and she has no exacerbations or new symptoms.    She stopped DMTs around 2005 and has done well.     Gait, balance, strength and sensation are doing well.  She gets dysesthesias in her arms only when she lays on her side at night.   They are mild so she had stopped the oxcabazepine.    Vision is fine.  Hearing is reduced AD.    No bladder issues.  She has a lot of fatigue.   She is on Adderall XR 30 mg at 530 am and 10 mg IR rarely in the afternoons.  She is sleeping poorly - she sleeps 2-3 hours and gets up to use the bathroom and then has trouble staying asleep but falls back asleep and gets 2-3 more hours..  She denies restless leg syndrome.   Mood and cognition are doing well.  Migraines occur weekly.   She stopped topiramate but it did help some.  Rizatriptan usually eliminates a migraine once it starts.    MS History: MS History:   She presented in 1994 with a [place] sign and tingling in her hands. An MRI of the cervical spine revealed a cervical plaque. She also had an MRI of the brain and a lumbar puncture at that time. The studies were consistent with multiple sclerosis.   She was placed on Betaseron but had a lot of difficulty tolerating the medication. Specifically she would get severe myalgias and fatigue. Therefore, she used the medication intermittently over the next 7 years or so. In 2000, she delivered a healthy daughter. A few months later, she  became pregnant again but this pregnancy was complicated by adenomyosis and she required a hysterectomy. Afterwards, she had an exacerbation. Her symptoms at that time included a poor gait with a Romberg sign. She was placed on several days of IV steroids and improved. Around 2001, she stopped Betaseron and has not been on any therapy since. Most of this time, she was seeing Dr. Jacqulynn Cadet. She last saw him in 2014.   I first saw her May 2016.      FH:  Her brother (one year older) has primary progressive MS and has right hemiplegia.    Imaging studies: MRI of the brain 11/06/2021 showed 4 meningiomas, 3 along the right frontal convexity and 1 along the floor of the left middle cranial fossa.  She has scattered T2/FLAIR foci in the hemispheres consistent with her MS.  No new lesions.  MRI 03/29/2020:  Scattered T2/FLAIR hyperintense foci in the periventricular, juxtacortical deep white matter of the hemispheres and upper cervical spinal cord consistent with chronic demyelinating plaque associated with multiple sclerosis. None of the foci were new compared to the 2019 MRI. Additionally, there is a 1.2 cm right frontal meningioma that appears stable  MRI brain 04/15/2017:  Stable appearance of demyelinating lesions involvement of the corpus callosum.    Fluid level of the left maxillary sinus  compatible with acute sinusitis. The patient has had left maxillary sinusitis on previous  studies as well.   Stable right frontal meningiomas  MRI cervical spine 09/05/2017 showed chronic cervical spinal cord lesions at C2 and C3,  Stable since 2007.  There is perhaps a new small left far lateral cord lesion at C4-C5, but no other discrete cord lesions and no other progression of demyelinating disease since 2007.   Mild for age cervical spine degeneration. Mild C6-C7 disc and endplate degeneration has developed since 2007 with borderline to mild bilateral C7 foraminal stenosis.  MRI of the brain 08/18/2015 and compared it  to the prior MRI. There are white matter lesions in a pattern and configuration consistent with multiple sclerosis. They are all located in the hemispheres and there are no brainstem or cerebellar lesions.   Additionally, there is a 1.2 cm right frontal meningioma that was unchanged.   MRI of the cervical spine12/31/2007 shows midline posterior plaque at C2 and left posterior plaque at C3.    She had another MRI of the brain 06/08/2012 compared to 2011 MRI shows a similar pattern and configuration of white matter foci. In the interval, there has only been the development of only 1 new small plaque in the right cerebral hemisphere. As before, there were no posterior fossa lesions. That study was done without contrast.  The meningioma looks about the same size. Chronic left maxillary and ethmoid sinusitis is noted.   REVIEW OF SYSTEMS: Constitutional: No fevers, chills, sweats, or change in appetite.   She notes some fatigue and sleepiness. Eyes: No visual changes, double vision, eye pain Ear, nose and throat: No hearing loss, ear pain, nasal congestion, sore throat Cardiovascular: No chest pain, palpitations Respiratory:  No shortness of breath at rest or with exertion.   No wheezes GastrointestinaI: No nausea, vomiting, diarrhea, abdominal pain, fecal incontinence Genitourinary:  No dysuria, urinary retention or frequency.  No nocturia. Musculoskeletal:  No neck pain, back pain Integumentary: No rash, pruritus, skin lesions Neurological: as above Psychiatric: No depression at this time.  No anxiety Endocrine: No palpitations, diaphoresis, change in appetite, change in weigh or increased thirst Hematologic/Lymphatic:  No anemia, purpura, petechiae. Allergic/Immunologic: No itchy/runny eyes, nasal congestion, recent allergic reactions, rashes  ALLERGIES: Allergies  Allergen Reactions   Prochlorperazine Edisylate Anaphylaxis   Eggs Or Egg-Derived Products Nausea And Vomiting   Gadolinium  Derivatives Nausea And Vomiting   Morphine Itching    HOME MEDICATIONS:  Current Outpatient Medications:    cholecalciferol (VITAMIN D) 1000 units tablet, Take 8,000 Units by mouth daily. , Disp: , Rfl:    imipramine (TOFRANIL) 25 MG tablet, Take 1 tablet (25 mg total) by mouth at bedtime., Disp: 90 tablet, Rfl: 3   LORazepam (ATIVAN) 0.5 MG tablet, Take 1 tablet by mouth 15 minutes prior to MRI, may repeat upon instructions of radiologist/mri technician., Disp: 2 tablet, Rfl: 0   Multiple Vitamin (MULTIVITAMIN) tablet, Take 1 tablet by mouth daily., Disp: , Rfl:    ondansetron (ZOFRAN-ODT) 8 MG disintegrating tablet, DISSOLVE 1 TABLET BY MOUTH EVERY 8 HOURS AS NEEDED FOR NAUSEA OR VOMITING., Disp: 20 tablet, Rfl: 5   rizatriptan (MAXALT-MLT) 10 MG disintegrating tablet, DISSOLVE 1 TABLET BY MOUTH AS NEEDED FOR MIGRAINE, MAY REPEAT IN 2 HOURS AS NEEDED, Disp: 9 tablet, Rfl: 11   topiramate (TOPAMAX) 50 MG tablet, Take 2-3 tablets (100-150 mg total) by mouth at bedtime., Disp: 270 tablet, Rfl: 4   amphetamine-dextroamphetamine (ADDERALL XR) 30 MG 24 hr  capsule, Take 1 capsule (30 mg total) by mouth daily., Disp: 90 capsule, Rfl: 0   LORazepam (ATIVAN) 0.5 MG tablet, Take 1 tablet (0.5 mg total) by mouth every 8 (eight) hours., Disp: 2 tablet, Rfl: 0  PAST MEDICAL HISTORY: Past Medical History:  Diagnosis Date   Chondromalacia of left patella 41/7408   Complication of anesthesia    slow to awaken after anesthesia for wisdom teeth extraction   Family history of adverse reaction to anesthesia    pt's father has hx. of being hard to wake up post-op   Lateral meniscus tear 10/2016   left   Meningioma (HCC)    right frontal   Migraines    Multiple sclerosis (HCC)    PONV (postoperative nausea and vomiting)    PVC's (premature ventricular contractions)    no current med.   Vitamin D deficiency     PAST SURGICAL HISTORY: Past Surgical History:  Procedure Laterality Date   AUGMENTATION  MAMMAPLASTY Bilateral    BREAST BIOPSY Right 08/14/2016   CHONDROPLASTY Left 10/31/2016   Procedure: LEFT KNEE CHONDROPLASTY;  Surgeon: Renette Butters, MD;  Location: [facility];  Service: Orthopedics;  Laterality: Left;   ESOPHAGOGASTRODUODENOSCOPY (EGD) WITH PROPOFOL N/A 07/05/2015   Procedure: ESOPHAGOGASTRODUODENOSCOPY (EGD) WITH PROPOFOL;  Surgeon: Arta Silence, MD;  Location: [facility];  Service: Endoscopy;  Laterality: N/A;   KNEE ARTHROSCOPY WITH LATERAL MENISECTOMY Left 10/31/2016   Procedure: LEFT KNEE ARTHROSCOPY WITH LATERAL MENISCECTOMY;  Surgeon: Renette Butters, MD;  Location: [facility];  Service: Orthopedics;  Laterality: Left;   KNEE HARDWARE REMOVAL Right 11/30/2009   with debridement of scar tissue   ORIF PROXIMAL TIBIAL PLATEAU FRACTURE Right 06/01/2009   TOTAL VAGINAL HYSTERECTOMY  05/22/2000   WISDOM TOOTH EXTRACTION      FAMILY HISTORY: Family History  Problem Relation Age of Onset   Heart disease Paternal Grandfather    Stomach cancer Paternal Grandmother    Colon cancer Paternal Grandmother    Heart murmur Father    Heart disease Father        TAVR, pacemaker   Heart failure Father    Anesthesia problems Father        hard to wake up post-op    SOCIAL HISTORY:  Social History   Socioeconomic History   Marital status: Married    Spouse name: Not on file   Number of children: 2   Years of education: Not on file   Highest education level: Not on file  Occupational History   Occupation: MRI Librarian, academic at [employer]: [employer] Use   Smoking status: Never   Smokeless tobacco: Never  Vaping Use   Vaping Use: Never used  Substance and Sexual Activity   Alcohol use: No    Alcohol/week: 0.0 standard drinks of alcohol   Drug use: No   Sexual activity: Not on file  Other Topics Concern   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  Married.     Social Determinants of  Health   Financial Resource Strain: Not on file  Food Insecurity: Not on file  Transportation Needs: Not on file  Physical Activity: Not on file  Stress: Not on file  Social Connections: Not on file  Intimate Partner Violence: Not on file     PHYSICAL EXAM  Vitals:   04/15/22 0822  BP: 109/73  Pulse: 82  Weight: 122 lb (55.3 kg)  Height: '5\' 8"'$  (1.727 m)    Body mass index is 18.55 kg/m.   General: The patient is well-developed and well-nourished and in no acute distress   Neurologic Exam   Mental status: The patient is alert and oriented x 3 at the time of the examination. The patient has apparent normal recent and remote memory, with an apparently normal attention span and concentration ability.   Speech is normal.   Cranial nerves: Extraocular muscles are intact.  Facial strength and sensation is normal.  Trapezius strength is normal.. No obvious hearing deficits are noted.   Motor:  Muscle bulk is normal.  Muscle tone is normal.  Strength is 5/5 in the arms and legs.   Sensory: Normal sensation to touch and vibration. . Coordination: Cerebellar testing shows good finger-nose-finger and heel-to-shin.   Gait and station: Station is normal.  Her gait is normal.  Tandem gait is slightly wide.  Romberg is negative.Reflexes: Deep tendon reflexes are normal in the arms but brisk in the legs with spread at the knees.Marland Kitchen             DIAGNOSTIC DATA (LABS, IMAGING, TESTING) - I reviewed patient records, labs, notes, testing and imaging myself where available.  Lab Results  Component Value Date   WBC 12.0 (H) 03/01/2021   HGB 13.4 03/01/2021   HCT 40.4 03/01/2021   MCV 88.4 03/01/2021   PLT 352 03/01/2021      Component Value Date/Time   NA 137 03/01/2021 1210   NA 139 11/04/2019 1636   K 3.7 03/01/2021 1210   CL 103 03/01/2021 1210   CO2 28 03/01/2021 1210   GLUCOSE 86 03/01/2021 1210   BUN 19 03/01/2021 1210   BUN 17 11/04/2019 1636   CREATININE 0.61  03/01/2021 1210   CALCIUM 8.9 03/01/2021 1210   PROT 6.9 03/01/2021 1210   PROT 6.9 11/04/2019 1636   ALBUMIN 3.9 03/01/2021 1210   ALBUMIN 4.3 11/04/2019 1636   AST 24 03/01/2021 1210   ALT 19 03/01/2021 1210   ALKPHOS 68 03/01/2021 1210   BILITOT 0.4 03/01/2021 1210   BILITOT <0.2 11/04/2019 1636   GFRNONAA >60 03/01/2021 1210   GFRAA 125 11/04/2019 1636   Lab Results  Component Value Date   CHOL 166 11/04/2018   HDL 48.20 11/04/2018   LDLCALC 101 (H) 11/04/2018   TRIG 84.0 11/04/2018   CHOLHDL 3 11/04/2018   No results found for: "HGBA1C" No results found for: "VITAMINB12" Lab Results  Component Value Date   TSH 1.35 11/04/2018    _______________________________________________________________        ASSESSMENT AND PLAN  Multiple sclerosis ([place]) - Plan: MR BRAIN W WO CONTRAST  Meningioma ([place]) - Plan: MR BRAIN W WO CONTRAST  1.   She has been very stable off of a disease modifying therapy with no new lesions on MRI and no clinical exacerbations.  She also has several meningiomas and is followed by Dr. Tommi Rumps at [facility].  She has advised her to get another MRI before the next appointment with him in a month.  We will order that.   2.   Continue Adderall XR 30 mg every morning and additional 10 mg in the afternoon if needed. 3.   Continue rizatriptan as needed migraine. 4.   I will add imipramine 25 mg nightly.  Hopefully this will help the migraine headaches as well as help her sleep maintenance and reduce nocturia.  . 5.   Return in 12 months or sooner  if there are new or worsening neurologic symptoms.  Tyshawn Keel A. Felecia Shelling, MD, PhD 1/95/9747, 1:85 AM Certified in Neurology, Clinical Neurophysiology, Sleep Medicine, Pain Medicine and Neuroimaging  [facility] [address] [place], [place] [address] [phone]

## 2022-05-01 ENCOUNTER — Telehealth: Payer: Self-pay | Admitting: Neurology

## 2022-05-01 NOTE — Telephone Encounter (Signed)
[employer] ref #[id number] sent to [facility]

## 2022-05-27 ENCOUNTER — Encounter: Payer: Self-pay | Admitting: Neurology

## 2022-05-27 ENCOUNTER — Other Ambulatory Visit (HOSPITAL_COMMUNITY): Payer: Self-pay

## 2022-05-28 ENCOUNTER — Other Ambulatory Visit (HOSPITAL_COMMUNITY): Payer: Self-pay

## 2022-05-28 ENCOUNTER — Other Ambulatory Visit: Payer: Self-pay | Admitting: Neurology

## 2022-05-28 MED ORDER — LORAZEPAM 0.5 MG PO TABS
ORAL_TABLET | ORAL | 0 refills | Status: DC
  Filled 2022-05-28: qty 2, 1d supply, fill #0

## 2022-05-28 NOTE — Telephone Encounter (Signed)
Dr.Sater it appears you have approved Ativan in past for MRI. Pt would Rx sent to [facility] outpatient pharmacy.

## 2022-05-30 ENCOUNTER — Ambulatory Visit (HOSPITAL_COMMUNITY)
Admission: RE | Admit: 2022-05-30 | Discharge: 2022-05-30 | Disposition: A | Discharge status: Home / Self Care | Payer: Commercial Managed Care - PPO | Source: Ambulatory Visit | Attending: Neurology | Admitting: Neurology

## 2022-05-30 DIAGNOSIS — D329 Benign neoplasm of meninges, unspecified: Secondary | ICD-10-CM | POA: Insufficient documentation

## 2022-05-30 DIAGNOSIS — G9389 Other specified disorders of brain: Secondary | ICD-10-CM | POA: Diagnosis not present

## 2022-05-30 DIAGNOSIS — G35 Multiple sclerosis: Secondary | ICD-10-CM | POA: Diagnosis not present

## 2022-05-30 MED ORDER — GADOBUTROL 1 MMOL/ML IV SOLN
6.0000 mL | Freq: Once | INTRAVENOUS | Status: AC | PRN
  Administered 2022-05-30: 6 mL via INTRAVENOUS

## 2022-06-20 ENCOUNTER — Other Ambulatory Visit: Payer: Self-pay

## 2022-07-09 ENCOUNTER — Other Ambulatory Visit: Payer: Self-pay | Admitting: Neurology

## 2022-07-09 ENCOUNTER — Other Ambulatory Visit (HOSPITAL_COMMUNITY): Payer: Self-pay

## 2022-07-09 ENCOUNTER — Other Ambulatory Visit: Payer: Self-pay

## 2022-07-09 MED ORDER — RIZATRIPTAN BENZOATE 10 MG PO TBDP
ORAL_TABLET | ORAL | 11 refills | Status: DC
  Filled 2022-07-09: qty 9, 15d supply, fill #0
  Filled 2022-08-09: qty 9, 15d supply, fill #1
  Filled 2022-09-03: qty 9, 15d supply, fill #2
  Filled 2022-09-21: qty 9, 15d supply, fill #3
  Filled 2022-11-17: qty 9, 15d supply, fill #4
  Filled 2022-12-09: qty 9, 15d supply, fill #5
  Filled 2023-01-16: qty 9, 15d supply, fill #6
  Filled 2023-03-02: qty 9, 15d supply, fill #7
  Filled 2023-04-16: qty 9, 15d supply, fill #8
  Filled 2023-05-22 – 2023-06-16 (×2): qty 9, 15d supply, fill #9

## 2022-07-09 MED ORDER — AMPHETAMINE-DEXTROAMPHET ER 30 MG PO CP24
30.0000 mg | ORAL_CAPSULE | Freq: Every day | ORAL | 0 refills | Status: DC
  Filled 2022-07-09 – 2022-08-08 (×2): qty 90, 90d supply, fill #0

## 2022-07-10 ENCOUNTER — Other Ambulatory Visit (HOSPITAL_COMMUNITY): Payer: Self-pay

## 2022-07-18 ENCOUNTER — Other Ambulatory Visit (HOSPITAL_COMMUNITY): Payer: Self-pay

## 2022-08-08 ENCOUNTER — Other Ambulatory Visit (HOSPITAL_COMMUNITY): Payer: Self-pay

## 2022-08-09 ENCOUNTER — Other Ambulatory Visit (HOSPITAL_COMMUNITY): Payer: Self-pay

## 2022-09-03 ENCOUNTER — Other Ambulatory Visit (HOSPITAL_COMMUNITY): Payer: Self-pay

## 2022-09-03 ENCOUNTER — Other Ambulatory Visit: Payer: Self-pay

## 2022-10-02 ENCOUNTER — Other Ambulatory Visit: Payer: Self-pay | Admitting: Oncology

## 2022-10-02 DIAGNOSIS — Z006 Encounter for examination for normal comparison and control in clinical research program: Secondary | ICD-10-CM

## 2022-10-17 ENCOUNTER — Ambulatory Visit (INDEPENDENT_AMBULATORY_CARE_PROVIDER_SITE_OTHER): Payer: Commercial Managed Care - PPO

## 2022-10-17 ENCOUNTER — Ambulatory Visit
Admission: EM | Admit: 2022-10-17 | Discharge: 2022-10-17 | Disposition: A | Discharge status: Home / Self Care | Payer: Commercial Managed Care - PPO | Attending: Emergency Medicine | Admitting: Emergency Medicine

## 2022-10-17 DIAGNOSIS — M79671 Pain in right foot: Secondary | ICD-10-CM | POA: Diagnosis not present

## 2022-10-17 DIAGNOSIS — M79644 Pain in right finger(s): Secondary | ICD-10-CM | POA: Diagnosis not present

## 2022-10-17 DIAGNOSIS — S90121A Contusion of right lesser toe(s) without damage to nail, initial encounter: Secondary | ICD-10-CM

## 2022-10-17 MED ORDER — NAPROXEN 500 MG PO TABS
500.0000 mg | ORAL_TABLET | Freq: Two times a day (BID) | ORAL | 0 refills | Status: DC
  Filled 2022-10-17 – 2022-11-17 (×2): qty 20, 10d supply, fill #0

## 2022-10-17 NOTE — Discharge Instructions (Signed)
The radiology nor I saw a fracture on your x-ray.  Keep this buddy taped.  Wear the boot as needed for comfort.  Take 500 mg of Naprosyn with 1000 mg of Tylenol twice a day.  Ice, elevate your foot when you can.  Follow-up with Dr. Susa Simmonds if not getting better in a week to 10 days.

## 2022-10-17 NOTE — ED Provider Notes (Signed)
HPI  SUBJECTIVE:  Ashley May is a 52 y.o. female who presents with right third toe pain after accidentally kicking a metal register with a barefoot 12 days ago.  She reports erythema, bruising, swelling at the distal phalanx of the toe.  The bruising has resolved.  She reports throbbing, constant pain becomes sharp when she bends her toe.  She denies injury to the rest of her foot.  No numbness or tingling, limitation of motion.  She has tried wearing tighter shoes and taking Tylenol without much improvement in her symptoms.  Symptoms are worse when she wears unsupportive soft shoes, with walking and with palpation.  He has a past medical history of MS, meningioma x 5, PVCs.  No history of toe injury.  LMP: Status post hysterectomy.  PCP: [facility].  Orthopedics: Dr. Susa Simmonds.    Past Medical History:  Diagnosis Date   Chondromalacia of left patella 10/2016   Complication of anesthesia    slow to awaken after anesthesia for wisdom teeth extraction   Family history of adverse reaction to anesthesia    pt's father has hx. of being hard to wake up post-op   Lateral meniscus tear 10/2016   left   Meningioma ([facility])    right frontal   Migraines    Multiple sclerosis ([facility])    PONV (postoperative nausea and vomiting)    PVC's (premature ventricular contractions)    no current med.   Vitamin D deficiency     Past Surgical History:  Procedure Laterality Date   AUGMENTATION MAMMAPLASTY Bilateral    BREAST BIOPSY Right 08/14/2016   CHONDROPLASTY Left 10/31/2016   Procedure: LEFT KNEE CHONDROPLASTY;  Surgeon: Sheral Apley, MD;  Location: [facility];  Service: Orthopedics;  Laterality: Left;   ESOPHAGOGASTRODUODENOSCOPY (EGD) WITH PROPOFOL N/A 07/05/2015   Procedure: ESOPHAGOGASTRODUODENOSCOPY (EGD) WITH PROPOFOL;  Surgeon: Willis Modena, MD;  Location[facility];  Service: Endoscopy;  Laterality: N/A;   KNEE ARTHROSCOPY WITH LATERAL MENISECTOMY Left  10/31/2016   Procedure: LEFT KNEE ARTHROSCOPY WITH LATERAL MENISCECTOMY;  Surgeon: Sheral Apley, MD;  Location: [facility];  Service: Orthopedics;  Laterality: Left;   KNEE HARDWARE REMOVAL Right 11/30/2009   with debridement of scar tissue   ORIF PROXIMAL TIBIAL PLATEAU FRACTURE Right 06/01/2009   TOTAL VAGINAL HYSTERECTOMY  05/22/2000   WISDOM TOOTH EXTRACTION      Family History  Problem Relation Age of Onset   Heart disease Paternal Grandfather    Stomach cancer Paternal Grandmother    Colon cancer Paternal Grandmother    Heart murmur Father    Heart disease Father        TAVR, pacemaker   Heart failure Father    Anesthesia problems Father        hard to wake up post-op    Social History   Tobacco Use   Smoking status: Never   Smokeless tobacco: Never  Vaping Use   Vaping status: Never Used  Substance Use Topics   Alcohol use: No    Alcohol/week: 0.0 standard drinks of alcohol   Drug use: No    No current facility-administered medications for this encounter.  Current Outpatient Medications:    amphetamine-dextroamphetamine (ADDERALL XR) 30 MG 24 hr capsule, Take 1 capsule (30 mg total) by mouth daily., Disp: 90 capsule, Rfl: 0   cholecalciferol (VITAMIN D) 1000 units tablet, Take 8,000 Units by mouth daily. , Disp: , Rfl:    Multiple Vitamin (MULTIVITAMIN) tablet, Take  1 tablet by mouth daily., Disp: , Rfl:    naproxen (NAPROSYN) 500 MG tablet, Take 1 tablet (500 mg total) by mouth 2 (two) times daily., Disp: 20 tablet, Rfl: 0   rizatriptan (MAXALT-MLT) 10 MG disintegrating tablet, DISSOLVE 1 TABLET BY MOUTH AS NEEDED FOR MIGRAINE, MAY REPEAT IN 2 HOURS AS NEEDED, Disp: 9 tablet, Rfl: 11   topiramate (TOPAMAX) 50 MG tablet, Take 2-3 tablets (100-150 mg total) by mouth at bedtime., Disp: 270 tablet, Rfl: 4   imipramine (TOFRANIL) 25 MG tablet, Take 1 tablet (25 mg total) by mouth at bedtime., Disp: 90 tablet, Rfl: 3   LORazepam (ATIVAN) 0.5 MG  tablet, Take 1 tablet by mouth 15 minutes prior to MRI, may repeat upon instructions of radiologist/mri technician., Disp: 2 tablet, Rfl: 0   ondansetron (ZOFRAN-ODT) 8 MG disintegrating tablet, DISSOLVE 1 TABLET BY MOUTH EVERY 8 HOURS AS NEEDED FOR NAUSEA OR VOMITING., Disp: 20 tablet, Rfl: 5  Allergies  Allergen Reactions   Prochlorperazine Edisylate Anaphylaxis   Egg-Derived Products Nausea And Vomiting   Gadolinium Derivatives Nausea And Vomiting   Morphine Itching     ROS  As noted in HPI.   Physical Exam  BP 131/76 (BP Location: Left Arm)   Pulse 72   Temp 98.4 F (36.9 C) (Oral)   Resp 16   SpO2 98%   Constitutional: Well developed, well nourished, no acute distress Eyes:  EOMI, conjunctiva normal bilaterally HENT: Normocephalic, atraumatic,mucus membranes moist Respiratory: Normal inspiratory effort Cardiovascular: Normal rate GI: nondistended skin: No rash, skin intact Musculoskeletal: Right third toe distal phalanx swollen, erythematous, tender.  No bruising, although she shows me a picture that shows extensive bruising at the distal phalanx.  Skin intact.  Proximal phalanx nontender.  Rest of the foot nontender.  Sensation grossly intact in all toes.  She is able to move all toes. Neurologic: Alert & oriented x 3, no focal neuro deficits Psychiatric: Speech and behavior appropriate   ED Course   Medications - No data to display  Orders Placed This Encounter  Procedures   DG Foot Complete Right    Standing Status:   Standing    Number of Occurrences:   1    Order Specific Question:   Reason for Exam (SYMPTOM  OR DIAGNOSIS REQUIRED)    Answer:   right foot pain   Buddy tape toes    Standing Status:   Standing    Number of Occurrences:   1    Order Specific Question:   Laterality    Answer:   Right    No results found for this or any previous visit (from the past 24 hour(s)). DG Foot Complete Right  Result Date: 10/17/2022 CLINICAL DATA:  Third digit  pain. EXAM: RIGHT FOOT COMPLETE - 3+ VIEW COMPARISON:  Right foot x-ray 08/16/2021 FINDINGS: There is no evidence of fracture or dislocation. There is no evidence of arthropathy or other focal bone abnormality. Soft tissues are unremarkable. IMPRESSION: Negative. Electronically Signed   By: Darliss Cheney M.D.   On: 10/17/2022 17:37    ED Clinical Impression  1. Contusion of lesser toe of right foot without damage to nail, initial encounter   2. Right foot pain      ED Assessment/Plan      Reviewed imaging independently.  No fracture.  See radiology report for full details.  X-ray negative for fracture.  Suspect ligamentous injury to the toe versus contusion.  I buddy taped the second and third  toes together and give the patient some Coban and paper tape.  She has a walking boot at home that she will wear.  Discussed negative x-ray findings with patient.  Naprosyn/Tylenol.  Follow-up with her orthopedic foot surgeon if not better in 7 to 10 days.  She may have a stress fracture that needs advanced imaging  Discussed imaging, MDM, treatment plan, and plan for follow-up with patient. . patient agrees with plan.   Meds ordered this encounter  Medications   naproxen (NAPROSYN) 500 MG tablet    Sig: Take 1 tablet (500 mg total) by mouth 2 (two) times daily.    Dispense:  20 tablet    Refill:  0      *This clinic note was created using Scientist, clinical (histocompatibility and immunogenetics). Therefore, there may be occasional mistakes despite careful proofreading.  ?    Domenick Gong, MD 10/17/22 [phone]

## 2022-10-17 NOTE — ED Triage Notes (Signed)
Pt states she was cleaning at home and accidentally kicked metal table with right 3rd toe 12 days ago. Pt states pain has not gotten any better and still having pain with walking and moving toe.

## 2022-10-18 ENCOUNTER — Other Ambulatory Visit (HOSPITAL_COMMUNITY): Payer: Self-pay

## 2022-10-25 ENCOUNTER — Other Ambulatory Visit (HOSPITAL_COMMUNITY): Payer: Self-pay

## 2022-10-25 DIAGNOSIS — M79674 Pain in right toe(s): Secondary | ICD-10-CM | POA: Diagnosis not present

## 2022-11-13 ENCOUNTER — Other Ambulatory Visit (HOSPITAL_COMMUNITY): Payer: Self-pay | Admitting: Neurosurgery

## 2022-11-13 ENCOUNTER — Ambulatory Visit (HOSPITAL_COMMUNITY)
Admission: RE | Admit: 2022-11-13 | Discharge: 2022-11-13 | Disposition: A | Discharge status: Home / Self Care | Payer: Commercial Managed Care - PPO | Source: Ambulatory Visit | Attending: Neurosurgery | Admitting: Neurosurgery

## 2022-11-13 DIAGNOSIS — G35 Multiple sclerosis: Secondary | ICD-10-CM | POA: Insufficient documentation

## 2022-11-13 DIAGNOSIS — D429 Neoplasm of uncertain behavior of meninges, unspecified: Secondary | ICD-10-CM | POA: Diagnosis not present

## 2022-11-13 DIAGNOSIS — R519 Headache, unspecified: Secondary | ICD-10-CM | POA: Diagnosis not present

## 2022-11-13 DIAGNOSIS — G35D Multiple sclerosis, unspecified: Secondary | ICD-10-CM

## 2022-11-13 DIAGNOSIS — R200 Anesthesia of skin: Secondary | ICD-10-CM | POA: Diagnosis not present

## 2022-11-13 DIAGNOSIS — G379 Demyelinating disease of central nervous system, unspecified: Secondary | ICD-10-CM | POA: Diagnosis not present

## 2022-11-13 MED ORDER — GADOBUTROL 1 MMOL/ML IV SOLN
6.0000 mL | Freq: Once | INTRAVENOUS | Status: AC | PRN
  Administered 2022-11-13: 6 mL via INTRAVENOUS

## 2022-11-14 ENCOUNTER — Other Ambulatory Visit (HOSPITAL_COMMUNITY): Payer: Commercial Managed Care - PPO

## 2022-11-17 ENCOUNTER — Other Ambulatory Visit: Payer: Self-pay | Admitting: Neurology

## 2022-11-17 ENCOUNTER — Other Ambulatory Visit (HOSPITAL_COMMUNITY): Payer: Self-pay

## 2022-11-18 ENCOUNTER — Other Ambulatory Visit (HOSPITAL_COMMUNITY): Payer: Self-pay

## 2022-11-18 ENCOUNTER — Other Ambulatory Visit: Payer: Self-pay

## 2022-11-18 DIAGNOSIS — M79674 Pain in right toe(s): Secondary | ICD-10-CM | POA: Diagnosis not present

## 2022-11-18 MED ORDER — AMPHETAMINE-DEXTROAMPHET ER 30 MG PO CP24
30.0000 mg | ORAL_CAPSULE | Freq: Every day | ORAL | 0 refills | Status: DC
  Filled 2022-11-18: qty 90, 90d supply, fill #0

## 2022-11-20 ENCOUNTER — Other Ambulatory Visit (HOSPITAL_COMMUNITY): Payer: Self-pay

## 2022-11-20 ENCOUNTER — Other Ambulatory Visit: Payer: Self-pay | Admitting: Neurology

## 2022-11-20 NOTE — Telephone Encounter (Signed)
Last seen on  Follow up scheduled on 04/16/23 Rx not on current med list Last filled in 2023.

## 2022-11-26 ENCOUNTER — Other Ambulatory Visit (HOSPITAL_COMMUNITY): Payer: Self-pay

## 2022-11-26 DIAGNOSIS — Z91041 Radiographic dye allergy status: Secondary | ICD-10-CM | POA: Diagnosis not present

## 2022-11-26 DIAGNOSIS — D329 Benign neoplasm of meninges, unspecified: Secondary | ICD-10-CM | POA: Diagnosis not present

## 2022-11-26 MED ORDER — PREDNISONE 50 MG PO TABS
ORAL_TABLET | ORAL | 0 refills | Status: DC
  Filled 2022-11-26: qty 3, 1d supply, fill #0

## 2022-12-06 ENCOUNTER — Other Ambulatory Visit (HOSPITAL_COMMUNITY): Payer: Self-pay

## 2022-12-10 ENCOUNTER — Other Ambulatory Visit (HOSPITAL_COMMUNITY): Payer: Self-pay

## 2022-12-10 ENCOUNTER — Other Ambulatory Visit: Payer: Self-pay | Admitting: Neurology

## 2022-12-11 NOTE — Telephone Encounter (Signed)
Last seen on 04/15/22 Follow up scheduled on 04/16/23

## 2022-12-30 ENCOUNTER — Encounter: Payer: Self-pay | Admitting: Neurology

## 2023-01-07 ENCOUNTER — Other Ambulatory Visit: Payer: Self-pay | Admitting: Neurology

## 2023-01-07 ENCOUNTER — Other Ambulatory Visit (HOSPITAL_COMMUNITY): Payer: Self-pay

## 2023-01-07 MED ORDER — ONDANSETRON 4 MG PO TBDP
4.0000 mg | ORAL_TABLET | Freq: Three times a day (TID) | ORAL | 5 refills | Status: DC | PRN
  Filled 2023-01-07: qty 20, 7d supply, fill #0
  Filled 2023-03-02: qty 20, 7d supply, fill #1
  Filled 2023-04-16: qty 20, 7d supply, fill #2
  Filled 2023-06-16: qty 20, 7d supply, fill #3
  Filled 2023-07-22: qty 20, 7d supply, fill #4
  Filled 2023-08-25: qty 20, 7d supply, fill #5

## 2023-01-16 ENCOUNTER — Other Ambulatory Visit: Payer: Self-pay

## 2023-01-16 ENCOUNTER — Other Ambulatory Visit (HOSPITAL_COMMUNITY): Payer: Self-pay

## 2023-01-20 ENCOUNTER — Other Ambulatory Visit (HOSPITAL_COMMUNITY): Payer: Self-pay

## 2023-02-11 ENCOUNTER — Other Ambulatory Visit (HOSPITAL_COMMUNITY): Payer: Self-pay

## 2023-03-06 ENCOUNTER — Other Ambulatory Visit (HOSPITAL_COMMUNITY): Payer: Self-pay

## 2023-04-14 DIAGNOSIS — M1712 Unilateral primary osteoarthritis, left knee: Secondary | ICD-10-CM | POA: Diagnosis not present

## 2023-04-16 ENCOUNTER — Ambulatory Visit: Payer: Commercial Managed Care - PPO | Admitting: Neurology

## 2023-04-16 ENCOUNTER — Other Ambulatory Visit: Payer: Self-pay | Admitting: Neurology

## 2023-04-16 ENCOUNTER — Other Ambulatory Visit (HOSPITAL_COMMUNITY): Payer: Self-pay

## 2023-04-16 ENCOUNTER — Telehealth: Payer: Self-pay | Admitting: Neurology

## 2023-04-16 ENCOUNTER — Other Ambulatory Visit: Payer: Self-pay

## 2023-04-16 ENCOUNTER — Encounter: Payer: Self-pay | Admitting: Neurology

## 2023-04-16 VITALS — BP 131/80 | HR 68 | Ht 68.0 in | Wt 128.0 lb

## 2023-04-16 DIAGNOSIS — Z8249 Family history of ischemic heart disease and other diseases of the circulatory system: Secondary | ICD-10-CM

## 2023-04-16 DIAGNOSIS — R200 Anesthesia of skin: Secondary | ICD-10-CM | POA: Diagnosis not present

## 2023-04-16 DIAGNOSIS — G35 Multiple sclerosis: Secondary | ICD-10-CM

## 2023-04-16 DIAGNOSIS — D329 Benign neoplasm of meninges, unspecified: Secondary | ICD-10-CM | POA: Diagnosis not present

## 2023-04-16 DIAGNOSIS — G43009 Migraine without aura, not intractable, without status migrainosus: Secondary | ICD-10-CM

## 2023-04-16 DIAGNOSIS — M4802 Spinal stenosis, cervical region: Secondary | ICD-10-CM | POA: Diagnosis not present

## 2023-04-16 MED ORDER — AMPHETAMINE-DEXTROAMPHETAMINE 10 MG PO TABS
10.0000 mg | ORAL_TABLET | Freq: Every day | ORAL | 0 refills | Status: DC
  Filled 2023-04-16: qty 30, 30d supply, fill #0

## 2023-04-16 MED ORDER — AMPHETAMINE-DEXTROAMPHET ER 30 MG PO CP24
30.0000 mg | ORAL_CAPSULE | Freq: Every day | ORAL | 0 refills | Status: DC
  Filled 2023-04-16: qty 90, 90d supply, fill #0

## 2023-04-16 NOTE — Telephone Encounter (Signed)
Last seen on 04/16/23 per note " Migraines occur weekly. She stopped topiramate but it did help some. Rizatriptan usually eliminates a migraine once it starts."

## 2023-04-16 NOTE — Telephone Encounter (Signed)
[facility] [employer] no auth required sent to [facility]. [phone]

## 2023-04-16 NOTE — Progress Notes (Signed)
[facility]  PATIENT: Ashley May DOB: [birth year]  REFERRING DOCTOR OR PCP:  [place] [place] SOURCE: patient, records in EMR and MRI images on PACS/CD  _________________________________   HISTORICAL  CHIEF COMPLAINT:  Chief Complaint  Patient presents with   Room 10    Pt is here Alone. Pt states that she gets a tingling and numb sensation in her right arm going down to her hand when she drives. Pt states that she is having fatigue. Pt states that she wants to discuss about another MRI to monitor her 4 brain tumors as well as discuss brain aneurysm and if they can be inherited due to her mom having them.     HISTORY OF PRESENT ILLNESS:  Ashley May is a 53 y.o. woman with relapsing remitting multiple sclerosis.   Update 1/22/20254: She feels her MS is stable and she has no exacerbations or new symptoms.    She stopped DMTs around 2005 and has done well.    She has multiple meningiomas and sees Dr. Zachery Conch at [facility].   The frontal meningioma is mildly increased in size.       She is having dysesthesias in her arms.  She feels the hands fall asleep.   Sometimes she wakes up with this.   Some positions seems to ring it on.   All fingers are involved,      Laying on side increases it.   Oxcarbazepine had not helped.      Gait, balance, strength and sensation are doing well.    Vision is fine.  Hearing is reduced AD.    No bladder issues.  She has a lot of fatigue.   She is on Adderall XR 30 mg at 530 am and 10 mg IR rarely in the afternoons.  She is sleeping poorly - she sleeps 2-3 hours and gets up to use the bathroom and then has trouble staying asleep but falls back asleep and gets 2-3 more hours..  She denies restless leg syndrome.   Mood and cognition are doing well.  Migraines occur weekly.   She stopped topiramate but it did help some.  Rizatriptan usually eliminates a migraine once it starts.    MS History: MS History:   She presented in 1994 with a  [place] sign and tingling in her hands. An MRI of the cervical spine revealed a cervical plaque. She also had an MRI of the brain and a lumbar puncture at that time. The studies were consistent with multiple sclerosis.   She was placed on Betaseron but had a lot of difficulty tolerating the medication. Specifically she would get severe myalgias and fatigue. Therefore, she used the medication intermittently over the next 7 years or so. In 2000, she delivered a healthy daughter. A few months later, she became pregnant again but this pregnancy was complicated by adenomyosis and she required a hysterectomy. Afterwards, she had an exacerbation. Her symptoms at that time included a poor gait with a Romberg sign. She was placed on several days of IV steroids and improved. Around 2001, she stopped Betaseron and has not been on any therapy since. Most of this time, she was seeing Dr. Leotis Shames. She last saw him in 2014.   I first saw her May 2016.      FH:  Her brother (one year older) has primary progressive MS and has right hemiplegia.    Imaging studies: MRI of the brain 11/06/2021 showed 4 meningiomas, 3 along the right frontal convexity  and 1 along the floor of the left middle cranial fossa.  She has scattered T2/FLAIR foci in the hemispheres consistent with her MS.  No new lesions.  MRI 03/29/2020:  Scattered T2/FLAIR hyperintense foci in the periventricular, juxtacortical deep white matter of the hemispheres and upper cervical spinal cord consistent with chronic demyelinating plaque associated with multiple sclerosis. None of the foci were new compared to the 2019 MRI. Additionally, there is a 1.2 cm right frontal meningioma that appears stable  MRI brain 04/15/2017:  Stable appearance of demyelinating lesions involvement of the corpus callosum.    Fluid level of the left maxillary sinus compatible with acute sinusitis. The patient has had left maxillary sinusitis on previous  studies as well.   Stable right  frontal meningiomas  MRI cervical spine 09/05/2017 showed chronic cervical spinal cord lesions at C2 and C3,  Stable since 2007.  There is perhaps a new small left far lateral cord lesion at C4-C5, but no other discrete cord lesions and no other progression of demyelinating disease since 2007.   Mild for age cervical spine degeneration. Mild C6-C7 disc and endplate degeneration has developed since 2007 with borderline to mild bilateral C7 foraminal stenosis.  MRI of the brain 08/18/2015 and compared it to the prior MRI. There are white matter lesions in a pattern and configuration consistent with multiple sclerosis. They are all located in the hemispheres and there are no brainstem or cerebellar lesions.   Additionally, there is a 1.2 cm right frontal meningioma that was unchanged.   MRI of the cervical spine12/31/2007 shows midline posterior plaque at C2 and left posterior plaque at C3.    She had another MRI of the brain 06/08/2012 compared to 2011 MRI shows a similar pattern and configuration of white matter foci. In the interval, there has only been the development of only 1 new small plaque in the right cerebral hemisphere. As before, there were no posterior fossa lesions. That study was done without contrast.  The meningioma looks about the same size. Chronic left maxillary and ethmoid sinusitis is noted.   REVIEW OF SYSTEMS: Constitutional: No fevers, chills, sweats, or change in appetite.   She notes some fatigue and sleepiness. Eyes: No visual changes, double vision, eye pain Ear, nose and throat: No hearing loss, ear pain, nasal congestion, sore throat Cardiovascular: No chest pain, palpitations Respiratory:  No shortness of breath at rest or with exertion.   No wheezes GastrointestinaI: No nausea, vomiting, diarrhea, abdominal pain, fecal incontinence Genitourinary:  No dysuria, urinary retention or frequency.  No nocturia. Musculoskeletal:  No neck pain, back pain Integumentary: No rash,  pruritus, skin lesions Neurological: as above Psychiatric: No depression at this time.  No anxiety Endocrine: No palpitations, diaphoresis, change in appetite, change in weigh or increased thirst Hematologic/Lymphatic:  No anemia, purpura, petechiae. Allergic/Immunologic: No itchy/runny eyes, nasal congestion, recent allergic reactions, rashes  ALLERGIES: Allergies  Allergen Reactions   Prochlorperazine Edisylate Anaphylaxis   Egg-Derived Products Nausea And Vomiting   Gadolinium Derivatives Nausea And Vomiting   Morphine Itching    HOME MEDICATIONS:  Current Outpatient Medications:    amphetamine-dextroamphetamine (ADDERALL) 10 MG tablet, Take 1 tablet (10 mg total) by mouth daily with breakfast., Disp: 30 tablet, Rfl: 0   cholecalciferol (VITAMIN D) 1000 units tablet, Take 8,000 Units by mouth daily. , Disp: , Rfl:    Multiple Vitamin (MULTIVITAMIN) tablet, Take 1 tablet by mouth daily., Disp: , Rfl:    ondansetron (ZOFRAN-ODT) 4 MG disintegrating tablet, Take  1 tablet (4 mg total) by mouth every 8 (eight) hours as needed for nausea or vomiting., Disp: 20 tablet, Rfl: 5   rizatriptan (MAXALT-MLT) 10 MG disintegrating tablet, DISSOLVE 1 TABLET BY MOUTH AS NEEDED FOR MIGRAINE, MAY REPEAT IN 2 HOURS AS NEEDED, Disp: 9 tablet, Rfl: 11   topiramate (TOPAMAX) 50 MG tablet, Take 2-3 tablets (100-150 mg total) by mouth at bedtime., Disp: 270 tablet, Rfl: 4   amphetamine-dextroamphetamine (ADDERALL XR) 30 MG 24 hr capsule, Take 1 capsule (30 mg total) by mouth daily., Disp: 90 capsule, Rfl: 0   imipramine (TOFRANIL) 25 MG tablet, Take 1 tablet (25 mg total) by mouth at bedtime. (Patient not taking: Reported on 04/16/2023), Disp: 90 tablet, Rfl: 3   LORazepam (ATIVAN) 0.5 MG tablet, Take 1 tablet by mouth 15 minutes prior to MRI, may repeat upon instructions of radiologist/mri technician. (Patient not taking: Reported on 04/16/2023), Disp: 2 tablet, Rfl: 0   naproxen (NAPROSYN) 500 MG tablet, Take  1 tablet (500 mg) by mouth 2 times daily. (Patient not taking: Reported on 04/16/2023), Disp: 20 tablet, Rfl: 0   predniSONE (DELTASONE) 50 MG tablet, Take one (1) tablet at 13 hours, 7 hours, and 1 hour prior to IV contrast. (Patient not taking: Reported on 04/16/2023), Disp: 3 tablet, Rfl: 0  PAST MEDICAL HISTORY: Past Medical History:  Diagnosis Date   Chondromalacia of left patella 10/2016   Complication of anesthesia    slow to awaken after anesthesia for wisdom teeth extraction   Family history of adverse reaction to anesthesia    pt's father has hx. of being hard to wake up post-op   Lateral meniscus tear 10/2016   left   Meningioma (HCC)    right frontal   Migraines    Multiple sclerosis (HCC)    PONV (postoperative nausea and vomiting)    PVC's (premature ventricular contractions)    no current med.   Vitamin D deficiency     PAST SURGICAL HISTORY: Past Surgical History:  Procedure Laterality Date   AUGMENTATION MAMMAPLASTY Bilateral    BREAST BIOPSY Right 08/14/2016   CHONDROPLASTY Left 10/31/2016   Procedure: LEFT KNEE CHONDROPLASTY;  Surgeon: Sheral Apley, MD;  Location: [facility];  Service: Orthopedics;  Laterality: Left;   ESOPHAGOGASTRODUODENOSCOPY (EGD) WITH PROPOFOL N/A 07/05/2015   Procedure: ESOPHAGOGASTRODUODENOSCOPY (EGD) WITH PROPOFOL;  Surgeon: Willis Modena, MD;  Location: [facility];  Service: Endoscopy;  Laterality: N/A;   KNEE ARTHROSCOPY WITH LATERAL MENISECTOMY Left 10/31/2016   Procedure: LEFT KNEE ARTHROSCOPY WITH LATERAL MENISCECTOMY;  Surgeon: Sheral Apley, MD;  Location: [facility];  Service: Orthopedics;  Laterality: Left;   KNEE HARDWARE REMOVAL Right 11/30/2009   with debridement of scar tissue   ORIF PROXIMAL TIBIAL PLATEAU FRACTURE Right 06/01/2009   TOTAL VAGINAL HYSTERECTOMY  05/22/2000   WISDOM TOOTH EXTRACTION      FAMILY HISTORY: Family History  Problem Relation Age of Onset   Heart  disease Paternal Grandfather    Stomach cancer Paternal Grandmother    Colon cancer Paternal Grandmother    Heart murmur Father    Heart disease Father        TAVR, pacemaker   Heart failure Father    Anesthesia problems Father        hard to wake up post-op    SOCIAL HISTORY:  Social History   Socioeconomic History   Marital status: Married    Spouse name: Not on file   Number of children: 2  Years of education: Not on file   Highest education level: Not on file  Occupational History   Occupation: MRI Supervisor at [employer]    Employer: [employer]  Tobacco Use   Smoking status: Never   Smokeless tobacco: Never  Vaping Use   Vaping status: Never Used  Substance and Sexual Activity   Alcohol use: No    Alcohol/week: 0.0 standard drinks of alcohol   Drug use: No   Sexual activity: Not on file  Other Topics Concern   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  Married.     Social Drivers of Corporate investment banker Strain: Not on file  Food Insecurity: Not on file  Transportation Needs: Not on file  Physical Activity: Not on file  Stress: Not on file  Social Connections: Not on file  Intimate Partner Violence: Not on file     PHYSICAL EXAM  Vitals:   04/16/23 0823  BP: 131/80  Pulse: 68  Weight: 128 lb (58.1 kg)  Height: 5\' 8"  (1.727 m)    Body mass index is 19.46 kg/m.   General: The patient is well-developed and well-nourished and in no acute distress   Neurologic Exam   Mental status: The patient is alert and oriented x 3 at the time of the examination. The patient has apparent normal recent and remote memory, with an apparently normal attention span and concentration ability.   Speech is normal.   Cranial nerves: Extraocular muscles are intact.  Facial strength and sensation is normal.  Trapezius strength is normal.. No obvious hearing deficits are noted.   Motor:  Muscle bulk is normal.  Muscle tone is normal.  Strength  is 5/5 in the arms and legs.   Sensory: Normal sensation to touch and vibration. . Coordination: Cerebellar testing shows good finger-nose-finger and heel-to-shin.   Gait and station: Station is normal.  Her gait is normal.  Her tandem gait is mildly wide.  Romberg is negative.  Reflexes: Deep tendon reflexes are normal in the arms but brisk in the legs with spread at the knees.Marland Kitchen             DIAGNOSTIC DATA (LABS, IMAGING, TESTING) - I reviewed patient records, labs, notes, testing and imaging myself where available.  Lab Results  Component Value Date   WBC 12.0 (H) 03/01/2021   HGB 13.4 03/01/2021   HCT 40.4 03/01/2021   MCV 88.4 03/01/2021   PLT 352 03/01/2021      Component Value Date/Time   NA 137 03/01/2021 1210   NA 139 11/04/2019 1636   K 3.7 03/01/2021 1210   CL 103 03/01/2021 1210   CO2 28 03/01/2021 1210   GLUCOSE 86 03/01/2021 1210   BUN 19 03/01/2021 1210   BUN 17 11/04/2019 1636   CREATININE 0.61 03/01/2021 1210   CALCIUM 8.9 03/01/2021 1210   PROT 6.9 03/01/2021 1210   PROT 6.9 11/04/2019 1636   ALBUMIN 3.9 03/01/2021 1210   ALBUMIN 4.3 11/04/2019 1636   AST 24 03/01/2021 1210   ALT 19 03/01/2021 1210   ALKPHOS 68 03/01/2021 1210   BILITOT 0.4 03/01/2021 1210   BILITOT <0.2 11/04/2019 1636   GFRNONAA >60 03/01/2021 1210   GFRAA 125 11/04/2019 1636   Lab Results  Component Value Date   CHOL 166 11/04/2018   HDL 48.20 11/04/2018   LDLCALC 101 (H) 11/04/2018   TRIG 84.0 11/04/2018   CHOLHDL 3 11/04/2018   No  results found for: "HGBA1C" No results found for: "VITAMINB12" Lab Results  Component Value Date   TSH 1.35 11/04/2018    _______________________________________________________________        ASSESSMENT AND PLAN  Multiple sclerosis (HCC) - Plan: MR BRAIN W WO CONTRAST, MR CERVICAL SPINE W WO CONTRAST  Foraminal stenosis of cervical region  Meningioma (HCC)  Common migraine without intractability - Plan: MR ANGIO HEAD WO  CONTRAST  Family history of cerebral aneurysm - Plan: MR ANGIO HEAD WO CONTRAST  Arm numbness - Plan: MR BRAIN W WO CONTRAST, MR CERVICAL SPINE W WO CONTRAST, MR ANGIO HEAD WO CONTRAST  1.   She has been very stable off of a disease modifying therapy with no new lesions on MRI and no clinical exacerbations.  2.   She also has several meningiomas and is followed by Dr. Zachery Conch at [facility].  She will continue to get surveillance MRIs for both the MS and the meningiomas.   3.   Continue Adderall XR 30 mg every morning.  Often she needs an additional 10 mg in the afternoon if needed. 4.   Topamax nightly for migraine headaches  .Continue rizatriptan/onansetron as needed migraine. 5.   Return in 12 months or sooner if there are new or worsening neurologic symptoms.  Erisha Paugh A. Epimenio Foot, MD, PhD 04/16/2023, 9:17 AM Certified in Neurology, Clinical Neurophysiology, Sleep Medicine, Pain Medicine and Neuroimaging  [facility] [address] [place], [place] [address] [phone]

## 2023-04-18 ENCOUNTER — Other Ambulatory Visit (HOSPITAL_COMMUNITY): Payer: Self-pay

## 2023-05-14 ENCOUNTER — Encounter: Payer: Self-pay | Admitting: Internal Medicine

## 2023-05-22 ENCOUNTER — Ambulatory Visit: Payer: Commercial Managed Care - PPO | Admitting: Internal Medicine

## 2023-05-22 ENCOUNTER — Other Ambulatory Visit (HOSPITAL_COMMUNITY)
Admission: RE | Admit: 2023-05-22 | Discharge: 2023-05-22 | Disposition: A | Discharge status: Home / Self Care | Source: Ambulatory Visit | Attending: Internal Medicine | Admitting: Internal Medicine

## 2023-05-22 VITALS — BP 104/66 | HR 70 | Temp 98.0°F | Resp 16 | Ht 68.5 in | Wt 131.0 lb

## 2023-05-22 DIAGNOSIS — D329 Benign neoplasm of meninges, unspecified: Secondary | ICD-10-CM

## 2023-05-22 DIAGNOSIS — Z124 Encounter for screening for malignant neoplasm of cervix: Secondary | ICD-10-CM

## 2023-05-22 DIAGNOSIS — G35 Multiple sclerosis: Secondary | ICD-10-CM | POA: Diagnosis not present

## 2023-05-22 DIAGNOSIS — Z1231 Encounter for screening mammogram for malignant neoplasm of breast: Secondary | ICD-10-CM | POA: Diagnosis not present

## 2023-05-22 DIAGNOSIS — H9190 Unspecified hearing loss, unspecified ear: Secondary | ICD-10-CM | POA: Insufficient documentation

## 2023-05-22 DIAGNOSIS — Z0000 Encounter for general adult medical examination without abnormal findings: Secondary | ICD-10-CM

## 2023-05-22 DIAGNOSIS — Z13220 Encounter for screening for lipoid disorders: Secondary | ICD-10-CM | POA: Diagnosis not present

## 2023-05-22 DIAGNOSIS — H9191 Unspecified hearing loss, right ear: Secondary | ICD-10-CM | POA: Diagnosis not present

## 2023-05-22 DIAGNOSIS — Z0001 Encounter for general adult medical examination with abnormal findings: Secondary | ICD-10-CM | POA: Diagnosis not present

## 2023-05-22 DIAGNOSIS — G43809 Other migraine, not intractable, without status migrainosus: Secondary | ICD-10-CM

## 2023-05-22 LAB — CBC WITH DIFFERENTIAL/PLATELET
Basophils Absolute: 0.1 10*3/uL (ref 0.0–0.1)
Basophils Relative: 1.3 % (ref 0.0–3.0)
Eosinophils Absolute: 0.4 10*3/uL (ref 0.0–0.7)
Eosinophils Relative: 5.4 % — ABNORMAL HIGH (ref 0.0–5.0)
HCT: 42.9 % (ref 36.0–46.0)
Hemoglobin: 14.3 g/dL (ref 12.0–15.0)
Lymphocytes Relative: 40.2 % (ref 12.0–46.0)
Lymphs Abs: 2.9 10*3/uL (ref 0.7–4.0)
MCHC: 33.2 g/dL (ref 30.0–36.0)
MCV: 87.6 fL (ref 78.0–100.0)
Monocytes Absolute: 0.6 10*3/uL (ref 0.1–1.0)
Monocytes Relative: 8.4 % (ref 3.0–12.0)
Neutro Abs: 3.2 10*3/uL (ref 1.4–7.7)
Neutrophils Relative %: 44.7 % (ref 43.0–77.0)
Platelets: 356 10*3/uL (ref 150.0–400.0)
RBC: 4.9 Mil/uL (ref 3.87–5.11)
RDW: 13.2 % (ref 11.5–15.5)
WBC: 7.1 10*3/uL (ref 4.0–10.5)

## 2023-05-22 LAB — COMPREHENSIVE METABOLIC PANEL
ALT: 10 U/L (ref 0–35)
AST: 13 U/L (ref 0–37)
Albumin: 4.1 g/dL (ref 3.5–5.2)
Alkaline Phosphatase: 73 U/L (ref 39–117)
BUN: 20 mg/dL (ref 6–23)
CO2: 32 meq/L (ref 19–32)
Calcium: 9.4 mg/dL (ref 8.4–10.5)
Chloride: 106 meq/L (ref 96–112)
Creatinine, Ser: 0.8 mg/dL (ref 0.40–1.20)
GFR: 84.74 mL/min (ref >60.00)
Glucose, Bld: 95 mg/dL (ref 70–99)
Potassium: 5.1 meq/L (ref 3.5–5.1)
Sodium: 142 meq/L (ref 135–145)
Total Bilirubin: 0.5 mg/dL (ref 0.2–1.2)
Total Protein: 7.2 g/dL (ref 6.0–8.3)

## 2023-05-22 LAB — LIPID PANEL
Cholesterol: 188 mg/dL (ref 0–200)
HDL: 62 mg/dL (ref >39.00)
LDL Cholesterol: 111 mg/dL — ABNORMAL HIGH (ref 0–99)
NonHDL: 126.19
Total CHOL/HDL Ratio: 3
Triglycerides: 74 mg/dL (ref 0.0–149.0)
VLDL: 14.8 mg/dL (ref 0.0–40.0)

## 2023-05-22 LAB — TSH: TSH: 1.04 u[IU]/mL (ref 0.35–5.50)

## 2023-05-22 NOTE — Assessment & Plan Note (Addendum)
 Physical today 05/22/23.  Mammogram 02/25/22 - Birads I. She will schedule f/u mammogram. Given family history, have discussed referral to GI with question of need for colonoscopy. Due colonoscopy. S/p hysterectomy - PAP today.

## 2023-05-22 NOTE — Progress Notes (Signed)
 Subjective:    Patient ID: Ashley May, female    DOB: 20-Aug-1970, 53 y.o.   MRN: [id number]  Patient here for  Chief Complaint  Patient presents with   Annual Exam    HPI Here for a physical exam. Saw Dr Epimenio Foot 1/22/5 - f/u MS - stable. Recommended to continue adderall XR. Continue topamax nightly for migraine prevention and rizatriptan/onansetron prn migraine. Has been followed by Dr Zachery Conch at [facility] - for several meningiomas - continue to get surveillance MRIs. (Last evalauted 11/26/22 - recommended f/u in one year. Planning to follow up at [facility]. Will notify me of information. No chest pain or sob reported. Does report decreased hearing - right ear. No ear pain. Increased stress. Handling things well. Discussed due colonoscopy.    Past Medical History:  Diagnosis Date   Chondromalacia of left patella 10/2016   Complication of anesthesia    slow to awaken after anesthesia for wisdom teeth extraction   Family history of adverse reaction to anesthesia    pt's father has hx. of being hard to wake up post-op   Lateral meniscus tear 10/2016   left   Meningioma (HCC)    right frontal   Migraines    Multiple sclerosis (HCC)    PONV (postoperative nausea and vomiting)    PVC's (premature ventricular contractions)    no current med.   Vitamin D deficiency    Past Surgical History:  Procedure Laterality Date   AUGMENTATION MAMMAPLASTY Bilateral    BREAST BIOPSY Right 08/14/2016   CHONDROPLASTY Left 10/31/2016   Procedure: LEFT KNEE CHONDROPLASTY;  Surgeon: Sheral Apley, MD;  Location: [facility];  Service: Orthopedics;  Laterality: Left;   ESOPHAGOGASTRODUODENOSCOPY (EGD) WITH PROPOFOL N/A 07/05/2015   Procedure: ESOPHAGOGASTRODUODENOSCOPY (EGD) WITH PROPOFOL;  Surgeon: Willis Modena, MD;  Location: [facility];  Service: Endoscopy;  Laterality: N/A;   KNEE ARTHROSCOPY WITH LATERAL MENISECTOMY Left 10/31/2016   Procedure: LEFT KNEE ARTHROSCOPY WITH  LATERAL MENISCECTOMY;  Surgeon: Sheral Apley, MD;  Location: [facility];  Service: Orthopedics;  Laterality: Left;   KNEE HARDWARE REMOVAL Right 11/30/2009   with debridement of scar tissue   ORIF PROXIMAL TIBIAL PLATEAU FRACTURE Right 06/01/2009   TOTAL VAGINAL HYSTERECTOMY  05/22/2000   WISDOM TOOTH EXTRACTION     Family History  Problem Relation Age of Onset   Heart disease Paternal Grandfather    Stomach cancer Paternal Grandmother    Colon cancer Paternal Grandmother    Heart murmur Father    Heart disease Father        TAVR, pacemaker   Heart failure Father    Anesthesia problems Father        hard to wake up post-op   Social History   Socioeconomic History   Marital status: Married    Spouse name: Not on file   Number of children: 2   Years of education: Not on file   Highest education level: Not on file  Occupational History   Occupation: MRI Merchandiser, retail at [employer]    Employer: [employer]  Tobacco Use   Smoking status: Never   Smokeless tobacco: Never  Vaping Use   Vaping status: Never Used  Substance and Sexual Activity   Alcohol use: No    Alcohol/week: 0.0 standard drinks of alcohol   Drug use: No   Sexual activity: Not on file  Other Topics Concern   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  Married.     Social Drivers of Corporate investment banker Strain: Not on file  Food Insecurity: Not on file  Transportation Needs: Not on file  Physical Activity: Not on file  Stress: Not on file  Social Connections: Not on file     Review of Systems  Constitutional:  Negative for appetite change and unexpected weight change.  HENT:  Negative for congestion, sinus pressure and sore throat.        Decreased hearing - right ear.   Eyes:  Negative for pain and visual disturbance.  Respiratory:  Negative for cough, chest tightness and shortness of breath.   Cardiovascular:  Negative for chest pain, palpitations and leg  swelling.  Gastrointestinal:  Negative for abdominal pain, diarrhea, nausea and vomiting.  Genitourinary:  Negative for difficulty urinating and dysuria.  Musculoskeletal:  Negative for joint swelling and myalgias.  Skin:  Negative for color change and rash.  Neurological:  Negative for dizziness and headaches.  Hematological:  Negative for adenopathy. Does not bruise/bleed easily.  Psychiatric/Behavioral:  Negative for agitation and dysphoric mood.        Objective:     BP 104/66   Pulse 70   Temp 98 F (36.7 C)   Resp 16   Ht 5' 8.5" (1.74 m)   Wt 131 lb (59.4 kg)   SpO2 99%   BMI 19.63 kg/m  Wt Readings from Last 3 Encounters:  05/22/23 131 lb (59.4 kg)  04/16/23 128 lb (58.1 kg)  04/15/22 122 lb (55.3 kg)    Physical Exam Vitals reviewed.  Constitutional:      General: She is not in acute distress.    Appearance: Normal appearance. She is well-developed.  HENT:     Head: Normocephalic and atraumatic.     Right Ear: External ear normal.     Left Ear: External ear normal.     Ears:     Comments: Cerumen present right ear. Decreased hearing right ear.     Mouth/Throat:     Pharynx: No oropharyngeal exudate or posterior oropharyngeal erythema.  Eyes:     General: No scleral icterus.       Right eye: No discharge.        Left eye: No discharge.     Conjunctiva/sclera: Conjunctivae normal.  Neck:     Thyroid: No thyromegaly.  Cardiovascular:     Rate and Rhythm: Normal rate and regular rhythm.  Pulmonary:     Effort: No tachypnea, accessory muscle usage or respiratory distress.     Breath sounds: Normal breath sounds. No decreased breath sounds or wheezing.  Chest:  Breasts:    Right: No inverted nipple, mass, nipple discharge or tenderness (no axillary adenopathy).     Left: No inverted nipple, mass, nipple discharge or tenderness (no axilarry adenopathy).  Abdominal:     General: Bowel sounds are normal.     Palpations: Abdomen is soft.     Tenderness:  There is no abdominal tenderness.  Genitourinary:    Comments: Normal external genitalia.  Vaginal vault without lesions.   Pap smear performed of the vaginal cuff. Could not appreciate any adnexal masses or tenderness.   Musculoskeletal:        General: No swelling or tenderness.     Cervical back: Neck supple.  Lymphadenopathy:     Cervical: No cervical adenopathy.  Skin:    Findings: No erythema or rash.  Neurological:     Mental Status: She is  alert and oriented to person, place, and time.  Psychiatric:        Mood and Affect: Mood normal.        Behavior: Behavior normal.         Outpatient Encounter Medications as of 05/22/2023  Medication Sig   amphetamine-dextroamphetamine (ADDERALL XR) 30 MG 24 hr capsule Take 1 capsule (30 mg total) by mouth daily.   amphetamine-dextroamphetamine (ADDERALL) 10 MG tablet Take 1 tablet (10 mg total) by mouth daily with breakfast.   cholecalciferol (VITAMIN D) 1000 units tablet Take 8,000 Units by mouth daily.    Multiple Vitamin (MULTIVITAMIN) tablet Take 1 tablet by mouth daily.   ondansetron (ZOFRAN-ODT) 4 MG disintegrating tablet Take 1 tablet (4 mg total) by mouth every 8 (eight) hours as needed for nausea or vomiting.   rizatriptan (MAXALT-MLT) 10 MG disintegrating tablet DISSOLVE 1 TABLET BY MOUTH AS NEEDED FOR MIGRAINE, MAY REPEAT IN 2 HOURS AS NEEDED   topiramate (TOPAMAX) 50 MG tablet Take 2-3 tablets (100-150 mg total) by mouth at bedtime.   [DISCONTINUED] LORazepam (ATIVAN) 0.5 MG tablet Take 1 tablet by mouth 15 minutes prior to MRI, may repeat upon instructions of radiologist/mri technician. (Patient not taking: Reported on 04/16/2023)   [DISCONTINUED] naproxen (NAPROSYN) 500 MG tablet Take 1 tablet (500 mg) by mouth 2 times daily. (Patient not taking: Reported on 04/16/2023)   No facility-administered encounter medications on file as of 05/22/2023.     Lab Results  Component Value Date   WBC 7.1 05/22/2023   HGB 14.3  05/22/2023   HCT 42.9 05/22/2023   PLT 356.0 05/22/2023   GLUCOSE 95 05/22/2023   CHOL 188 05/22/2023   TRIG 74.0 05/22/2023   HDL 62.00 05/22/2023   LDLCALC 111 (H) 05/22/2023   ALT 10 05/22/2023   AST 13 05/22/2023   NA 142 05/22/2023   K 5.1 05/22/2023   CL 106 05/22/2023   CREATININE 0.80 05/22/2023   BUN 20 05/22/2023   CO2 32 05/22/2023   TSH 1.04 05/22/2023   INR 1.03 05/31/2009    MR BRAIN W WO CONTRAST Result Date: 11/13/2022 CLINICAL DATA:  53 year old female with a history of multiple sclerosis and Meningiomatosis. Headaches. Intermittent left arm numbness. Restaging. EXAM: MRI HEAD WITHOUT AND WITH CONTRAST TECHNIQUE: Multiplanar, multiecho pulse sequences of the brain and surrounding structures were obtained without and with intravenous contrast. CONTRAST:  6mL GADAVIST GADOBUTROL 1 MMOL/ML IV SOLN COMPARISON:  Brain MRI 05/30/2022 and earlier. FINDINGS: Brain: Nodular, adjacent dural thickening and enhancement consistent with Meningiomatosis along the right frontal convexity has not significantly changed since 11/06/2021. But the dominant lesion there is now about 17 mm diameter (series 19, image 26 and series 18, image 117), versus 15-16 mm 1 year ago. Separate left middle cranial fossa floor similar plaque-like dural nodularity is now 12 mm diameter (up to 7 mm thickness series 19, image 20) and stable from last year. As before there is no associated cerebral edema, and only subtle mass effect. No new areas of dural thickening or nodularity. And no other abnormal intracranial enhancement today. Chronic scattered cerebral white matter T2 and FLAIR hyperintensity remain stable since last year, ranging from periventricular to subcortical in both hemispheres and compatible with chronic demyelinating disease. Mild T2 shine through. Deep gray nuclei, brainstem and cerebellum remain relatively spared. No superimposed restricted diffusion to suggest acute infarction. Stable overall  cerebral volume. No midline shift, ventriculomegaly, or acute intracranial hemorrhage. Cervicomedullary junction and pituitary are within normal limits. No  chronic cerebral blood products or new signal abnormality Vascular: Major intracranial vascular flow voids are stable. Following contrast the major dural venous sinuses remain enhancing and patent. Skull and upper cervical spine: Negative visible cervical spine. Visualized bone marrow signal is within normal limits. Sinuses/Orbits: Orbits appear stable and grossly negative. Small right maxillary sinus mucous retention cyst. Other: Mastoids remain clear. Visible internal auditory structures appear normal. Negative visible scalp and face. IMPRESSION: 1. Right anterior frontal convexity Meningiomatosis with slowly enlarging dominant dural lesion there, increased by about 1 mm since 11/06/2021 (now up to 17 mm diameter on series 19, image 26). Separate 12 mm Meningioma along the floor of the left middle cranial fossa appears unchanged. No associated cerebral edema, no significant mass effect. 2. Stable chronic demyelinating disease. 3. No new intracranial abnormality. Electronically Signed   By: Odessa Fleming M.D.   On: 11/13/2022 07:05       Assessment & Plan:  Routine general medical examination at a health care facility  Health care maintenance Assessment & Plan: Physical today 05/22/23.  Mammogram 02/25/22 - Birads I. She will schedule f/u mammogram. Given family history, have discussed referral to GI with question of need for colonoscopy. Due colonoscopy. S/p hysterectomy - PAP today.     Multiple sclerosis (HCC) Assessment & Plan: Followed by neurology.  Stable.   Orders: -     Comprehensive metabolic panel -     CBC with Differential/Platelet -     TSH  Screening cholesterol level -     Lipid panel  Decreased hearing of right ear Assessment & Plan: Cerumen present. No impaction today on exam. Decreased hearing. Prefers ENT referral for  further evaluation and cerumen removal.   Orders: -     Ambulatory referral to ENT  Meningioma [facility]) Assessment & Plan:  Has been followed by Dr Zachery Conch at [facility] - for several meningiomas - continue to get surveillance MRIs. (Last evalauted 11/26/22 - recommended f/u in one year. Planning to follow up at [facility]. Planning to schedule an appt with Dr Leonette Most - neuro oncology - [facility].   Orders: -     Ambulatory referral to ENT  Other migraine without status migrainosus, not intractable Assessment & Plan: Seeing Dr Epimenio Foot - Continue topamax nightly for migraine prevention and rizatriptan/onansetron prn migraine.   Screening for cervical cancer -     Cytology - PAP  Visit for screening mammogram -     3D Screening Mammogram, Left and Right; Future     Dale Naches, MD

## 2023-05-23 ENCOUNTER — Other Ambulatory Visit: Payer: Self-pay

## 2023-05-23 DIAGNOSIS — E875 Hyperkalemia: Secondary | ICD-10-CM

## 2023-05-26 ENCOUNTER — Encounter: Payer: Self-pay | Admitting: Internal Medicine

## 2023-05-26 LAB — CYTOLOGY - PAP
Comment: NEGATIVE
Diagnosis: HIGH — AB
High risk HPV: NEGATIVE

## 2023-05-26 NOTE — Assessment & Plan Note (Signed)
 Has been followed by Dr Zachery Conch at [facility] - for several meningiomas - continue to get surveillance MRIs. (Last evalauted 11/26/22 - recommended f/u in one year. Planning to follow up at [facility]. Planning to schedule an appt with Dr Leonette Most - neuro oncology - [facility].

## 2023-05-26 NOTE — Assessment & Plan Note (Signed)
 Seeing Dr Epimenio Foot - Continue topamax nightly for migraine prevention and rizatriptan/onansetron prn migraine.

## 2023-05-26 NOTE — Assessment & Plan Note (Signed)
Followed by neurology.  Stable  

## 2023-05-26 NOTE — Assessment & Plan Note (Signed)
 Cerumen present. No impaction today on exam. Decreased hearing. Prefers ENT referral for further evaluation and cerumen removal.

## 2023-05-29 ENCOUNTER — Other Ambulatory Visit: Payer: Self-pay

## 2023-05-29 ENCOUNTER — Encounter: Payer: Self-pay | Admitting: Internal Medicine

## 2023-05-29 DIAGNOSIS — R87610 Atypical squamous cells of undetermined significance on cytologic smear of cervix (ASC-US): Secondary | ICD-10-CM

## 2023-05-29 NOTE — Telephone Encounter (Signed)
Discussed with patient.  Referral placed.

## 2023-05-29 NOTE — Telephone Encounter (Signed)
 See result note. Agree with gyn referral.

## 2023-06-02 ENCOUNTER — Other Ambulatory Visit (HOSPITAL_COMMUNITY): Payer: Self-pay

## 2023-06-03 ENCOUNTER — Other Ambulatory Visit (HOSPITAL_COMMUNITY): Payer: Self-pay

## 2023-06-06 ENCOUNTER — Other Ambulatory Visit (INDEPENDENT_AMBULATORY_CARE_PROVIDER_SITE_OTHER): Payer: Commercial Managed Care - PPO

## 2023-06-06 DIAGNOSIS — E875 Hyperkalemia: Secondary | ICD-10-CM

## 2023-06-06 LAB — POTASSIUM: Potassium: 4.2 meq/L (ref 3.5–5.1)

## 2023-06-07 ENCOUNTER — Encounter: Payer: Self-pay | Admitting: Internal Medicine

## 2023-06-07 DIAGNOSIS — D429 Neoplasm of uncertain behavior of meninges, unspecified: Secondary | ICD-10-CM

## 2023-06-16 DIAGNOSIS — H9041 Sensorineural hearing loss, unilateral, right ear, with unrestricted hearing on the contralateral side: Secondary | ICD-10-CM | POA: Diagnosis not present

## 2023-06-24 ENCOUNTER — Ambulatory Visit (HOSPITAL_COMMUNITY)
Admission: RE | Admit: 2023-06-24 | Discharge: 2023-06-24 | Disposition: A | Discharge status: Home / Self Care | Source: Ambulatory Visit | Attending: Neurology | Admitting: Neurology

## 2023-06-24 ENCOUNTER — Encounter: Payer: Self-pay | Admitting: Neurology

## 2023-06-24 DIAGNOSIS — G43009 Migraine without aura, not intractable, without status migrainosus: Secondary | ICD-10-CM | POA: Insufficient documentation

## 2023-06-24 DIAGNOSIS — R200 Anesthesia of skin: Secondary | ICD-10-CM | POA: Insufficient documentation

## 2023-06-24 DIAGNOSIS — G35 Multiple sclerosis: Secondary | ICD-10-CM

## 2023-06-24 DIAGNOSIS — Z1231 Encounter for screening mammogram for malignant neoplasm of breast: Secondary | ICD-10-CM | POA: Diagnosis not present

## 2023-06-24 DIAGNOSIS — M4802 Spinal stenosis, cervical region: Secondary | ICD-10-CM | POA: Diagnosis not present

## 2023-06-24 DIAGNOSIS — N761 Subacute and chronic vaginitis: Secondary | ICD-10-CM | POA: Diagnosis not present

## 2023-06-24 DIAGNOSIS — R42 Dizziness and giddiness: Secondary | ICD-10-CM | POA: Diagnosis not present

## 2023-06-24 DIAGNOSIS — M50323 Other cervical disc degeneration at C6-C7 level: Secondary | ICD-10-CM | POA: Diagnosis not present

## 2023-06-24 DIAGNOSIS — Z8249 Family history of ischemic heart disease and other diseases of the circulatory system: Secondary | ICD-10-CM | POA: Insufficient documentation

## 2023-06-24 DIAGNOSIS — R87611 Atypical squamous cells cannot exclude high grade squamous intraepithelial lesion on cytologic smear of cervix (ASC-H): Secondary | ICD-10-CM | POA: Diagnosis not present

## 2023-06-24 DIAGNOSIS — G379 Demyelinating disease of central nervous system, unspecified: Secondary | ICD-10-CM | POA: Diagnosis not present

## 2023-06-24 DIAGNOSIS — B977 Papillomavirus as the cause of diseases classified elsewhere: Secondary | ICD-10-CM | POA: Diagnosis not present

## 2023-06-24 MED ORDER — GADOBUTROL 1 MMOL/ML IV SOLN
6.0000 mL | Freq: Once | INTRAVENOUS | Status: AC | PRN
  Administered 2023-06-24: 6 mL via INTRAVENOUS

## 2023-06-24 NOTE — Telephone Encounter (Signed)
 See me before calling her. We had discussed history of meningiomas. Discussed further evaluation. She had informed me she was planning to schedule an appt with Dr Leonette Most - neuro oncology - [facility].

## 2023-06-25 ENCOUNTER — Other Ambulatory Visit (HOSPITAL_COMMUNITY): Payer: Self-pay

## 2023-06-25 MED ORDER — ESTRADIOL 0.1 MG/GM VA CREA
1.0000 g | TOPICAL_CREAM | VAGINAL | 3 refills | Status: AC
  Filled 2023-06-25: qty 42.5, 90d supply, fill #0
  Filled 2023-10-09: qty 42.5, 90d supply, fill #1

## 2023-06-25 NOTE — Telephone Encounter (Signed)
 Order placed for referral.  Also waiting to speak with Lacy Duverney after 06/30/23- when she returns.

## 2023-06-25 NOTE — Telephone Encounter (Signed)
 Genetic testing for multiple meningiomas. Consult with hematology per pt request. Referral to [facility] is separate. MD name listed below that pt prefers to see.

## 2023-07-04 ENCOUNTER — Telehealth: Payer: Self-pay | Admitting: Internal Medicine

## 2023-07-04 DIAGNOSIS — D429 Neoplasm of uncertain behavior of meninges, unspecified: Secondary | ICD-10-CM

## 2023-07-04 DIAGNOSIS — D329 Benign neoplasm of meninges, unspecified: Secondary | ICD-10-CM

## 2023-07-04 NOTE — Telephone Encounter (Signed)
Viewed by patient .

## 2023-07-04 NOTE — Telephone Encounter (Signed)
 Please call and notify Ashley May that I did speak to the genetic counselor and it is recommended for her to see Dr. Italy Haldeman-Englert - in [place] - for evaluation for genetic testing with her history of multiple meningiomas. I have placed the order. Someone should be contacting her with an appt date and time.

## 2023-07-10 ENCOUNTER — Encounter: Payer: Self-pay | Admitting: Internal Medicine

## 2023-07-10 MED ORDER — SCOPOLAMINE 1 MG/3DAYS TD PT72: 1.0000 | MEDICATED_PATCH | TRANSDERMAL | 0 refills | Status: AC

## 2023-07-10 NOTE — Telephone Encounter (Signed)
 Rx sent in for scopolamine patches to [facility].

## 2023-07-10 NOTE — Telephone Encounter (Signed)
 I can send in the scopolamine patches, need to clarify how long she is going to be gone.  Also, I would not recommend using the patches and taking meclizine. Also, meclizine is otc.

## 2023-07-14 ENCOUNTER — Other Ambulatory Visit (HOSPITAL_COMMUNITY): Payer: Self-pay

## 2023-07-14 DIAGNOSIS — N951 Menopausal and female climacteric states: Secondary | ICD-10-CM | POA: Diagnosis not present

## 2023-07-14 DIAGNOSIS — R5382 Chronic fatigue, unspecified: Secondary | ICD-10-CM | POA: Diagnosis not present

## 2023-07-14 DIAGNOSIS — F520 Hypoactive sexual desire disorder: Secondary | ICD-10-CM | POA: Diagnosis not present

## 2023-07-14 MED ORDER — VENLAFAXINE HCL ER 37.5 MG PO CP24
37.5000 mg | ORAL_CAPSULE | Freq: Every day | ORAL | 3 refills | Status: AC
  Filled 2023-07-14: qty 90, 90d supply, fill #0
  Filled 2023-10-09: qty 90, 90d supply, fill #1
  Filled 2024-01-05: qty 90, 90d supply, fill #2
  Filled 2024-03-09 – 2024-03-22 (×2): qty 90, 90d supply, fill #3

## 2023-07-22 ENCOUNTER — Ambulatory Visit: Admitting: Medical Genetics

## 2023-07-22 ENCOUNTER — Other Ambulatory Visit: Payer: Self-pay

## 2023-07-22 ENCOUNTER — Other Ambulatory Visit (HOSPITAL_COMMUNITY): Payer: Self-pay

## 2023-07-22 ENCOUNTER — Other Ambulatory Visit

## 2023-07-22 ENCOUNTER — Other Ambulatory Visit: Payer: Self-pay | Admitting: Neurology

## 2023-07-22 DIAGNOSIS — D329 Benign neoplasm of meninges, unspecified: Secondary | ICD-10-CM

## 2023-07-22 DIAGNOSIS — Z1379 Encounter for other screening for genetic and chromosomal anomalies: Secondary | ICD-10-CM | POA: Diagnosis not present

## 2023-07-22 DIAGNOSIS — Z006 Encounter for examination for normal comparison and control in clinical research program: Secondary | ICD-10-CM

## 2023-07-22 MED ORDER — RIZATRIPTAN BENZOATE 10 MG PO TBDP
ORAL_TABLET | ORAL | 9 refills | Status: AC
  Filled 2023-07-22: qty 9, 15d supply, fill #0
  Filled 2023-09-08: qty 9, 15d supply, fill #1
  Filled 2023-10-09: qty 9, 15d supply, fill #2
  Filled 2023-11-20: qty 9, 15d supply, fill #3
  Filled 2024-01-05: qty 9, 15d supply, fill #4
  Filled 2024-03-09 – 2024-03-29 (×2): qty 9, 15d supply, fill #5

## 2023-07-22 MED ORDER — PHENTERMINE HCL 37.5 MG PO TABS
18.7500 mg | ORAL_TABLET | Freq: Every day | ORAL | 0 refills | Status: AC
  Filled 2023-07-22: qty 30, 30d supply, fill #0

## 2023-07-22 NOTE — Progress Notes (Unsigned)
 MEDICAL GENETICS NEW PATIENT EVALUATION  Patient name: Ashley May DOB: [birth year] Age: 53 y.o. MRN: [id number]  Referring Provider/Specialty: Dellar Fenton, MD  Date of Evaluation: 07/22/2023 Chief Complaint/Reason for Referral: Multiple meningiomas  Assessment: We discussed with Ms. Kittell that she could have a genetic cause to her multiple meningiomas. Genes associated with meningiomas include SMARCE1, SMARCB1, SUFU, PTEN, NF2, BAP1, and PDGFB. Therefore, appropriate testing at this time would include a panel of genes associated with meningioma and other brain/spinal cord tumors. Ms. Soja was interested in this being performed, and consent and samples were obtained today for a panel of genes associated with nervous system/brain cancers through [facility]. Results are expected in 1 month, and we will contact her when they return. She should otherwise continue follow up with her providers per their recommendations.  Recommendations: Nervous system/brain cancer gene panel through [facility] - results expected in 1 month. Continue follow up with current medical providers per their recommendations. Activities as tolerated.  Follow up will be based on the results of the testing.   HPI: Ashley May is a 53 y.o. assigned female at birth who presents today for an initial genetics evaluation for multiple meningiomas. She provided the history. This information, along with a review of pertinent records, labs, and radiology studies, is summarized below.  Ashley May has had a longstanding history of multiple sclerosis. In 2000, she was noted to have a right frontal meningioma. A few years later another lesion was identified in that area. More recently (2023), 3 more were identified, with 4 total in the right frontal region and 1 in the left middle cranial fossa. No biopsy was performed on any of her lesions. She has been monitored by Dr. Reather Campbell at [facility] for several years and no surgery  has been performed. She is also followed by Dr. Suan Elm for her MS. She recently has been looking to go to [facility] where there is a meningioma expert. There is a family history of a large meningioma in her paternal grandmother that was removed in the 42s. Ms. Phillabaum has done research on having multiple meningiomas, and asked to be seen in genetics for further evaluation and testing.  Past Medical History: Patient Active Problem List   Diagnosis Date Noted   Decreased hearing 05/22/2023   Migraine headache 04/15/2022   Chronic nausea 03/01/2021   Pain around toenail 07/04/2019   Infected nailbed of toe 06/29/2019   Educated about COVID-19 virus infection 04/07/2019   LLQ pain 11/08/2018   Lhermitte's sign positive 09/04/2017   Vertigo 03/28/2017   Sinusitis 03/16/2017   Insomnia 10/07/2016   Other fatigue 10/07/2016   Health care maintenance 02/03/2015   Paresthesia 08/04/2014   Common migraine without intractability 08/04/2014   Sinusitis, chronic 08/04/2014   PVCs (premature ventricular contractions) 01/26/2014   Chronic cough 11/05/2010   HYPOKALEMIA 02/21/2010   Meningioma (HCC) 02/20/2010   Multiple sclerosis (HCC) 02/20/2010   PALPITATIONS 02/20/2010   SUPRAVENTRICULAR TACHYCARDIA, HX OF 02/20/2010   Medications: Current Outpatient Medications on File Prior to Visit  Medication Sig Dispense Refill   amphetamine -dextroamphetamine  (ADDERALL  XR) 30 MG 24 hr capsule Take 1 capsule (30 mg total) by mouth daily. 90 capsule 0   amphetamine -dextroamphetamine  (ADDERALL ) 10 MG tablet Take 1 tablet (10 mg total) by mouth daily with breakfast. 30 tablet 0   cholecalciferol (VITAMIN D) 1000 units tablet Take 8,000 Units by mouth daily.      estradiol  (ESTRACE ) 0.1 MG/GM vaginal cream Place 1  g vaginally 2 (two) times a week at bedtime for 90 days 42.5 g 3   Multiple Vitamin (MULTIVITAMIN) tablet Take 1 tablet by mouth daily.     ondansetron  (ZOFRAN -ODT) 4 MG  disintegrating tablet Take 1 tablet (4 mg total) by mouth every 8 (eight) hours as needed for nausea or vomiting. 20 tablet 5   rizatriptan  (MAXALT -MLT) 10 MG disintegrating tablet DISSOLVE 1 TABLET BY MOUTH AS NEEDED FOR MIGRAINE, MAY REPEAT IN 2 HOURS AS NEEDED 9 tablet 9   scopolamine  (TRANSDERM-SCOP) 1 MG/3DAYS Place 1 patch (1.5 mg total) onto the skin every 3 (three) days. 10 patch 0   topiramate  (TOPAMAX ) 50 MG tablet Take 2-3 tablets (100-150 mg total) by mouth at bedtime. 270 tablet 4   venlafaxine  XR (EFFEXOR -XR) 37.5 MG 24 hr capsule Take 1 capsule (37.5 mg total) by mouth daily. 90 capsule 3   No current facility-administered medications on file prior to visit.   Allergies:  Allergies  Allergen Reactions   Prochlorperazine Edisylate Anaphylaxis   Egg-Derived Products Nausea And Vomiting   Gadolinium Derivatives Nausea And Vomiting   Morphine Itching   Review of Systems: Negative except as noted in the HPI  Family History: The family history was notable for the following: Daughter, 53 yo, with negative hereditary cancer genetic testing (related to her paternal family history of cancer). Son, 78 yo, alive and well. Brother, 53 yo, with primary progressive multiple sclerosis.   Paternal Family History Father, deceased at 51 yo, with ulcerative colitis, sclerosing cholangitis, squamous cell carcinoma, and an unknown heart condition requiring a pacemaker. Grandfather, deceased from heart disease. Grandmother, deceased in her 81s with stomach cancer dx in her 69s and a reportedly baseball-sized posterior meningioma.   Maternal Family History Mother, 44 yo, with 4 brain aneurysms (first at 53 yo), aortic valve replacement, leaky mitral valve, and anemia. Uncle, 58 yo, with idiopathic pulmonary fibrosis, nephritis, and melanoma of the arm at 43 yo. His son with sudden cardiac death at 80 yo, reportedly related to an enlarged heart. Aunt, deceased at 49 yo, with idiopathic  pulmonary fibrosis. Grandfather, deceased at 37 yo from suicide, with a history of heart disease and alcoholism. Grandmother, deceased, with Addison's disease.   Mother's ethnicity: [place] Father's ethnicity: Mixed European Consangunity: Denies Please see the Dentist note for additional information  Social History: Lives with spouse in [place]  Vitals: Weight: 131.1 lb  Genetics Physical Exam:  Constitution: The patient is active and alert  Head: No abnormalities detected in: head, hairline, shape or size    Anterior fontanelle flat: not flat    Anterior fontanelle open: not open    Bitemporal narrowing: forehead not narrow    Frontal bossing: no frontal bossing    Macrocephaly: not macrocephalic    Microcephaly: not microcephalic    Plagiocephaly: not plagiocephalic  Face: No abnormalities detected in: face, midface or shape    Coarse facial features: no coarse facies    Midfacial hypoplasia: no midfacial hypoplasia  Cardiac: No abnormalities detected in: cardiovascular system    Abnormal distal perfusion: normal distal perfusion    Irregular rate: heart rate regular    Irregular rhythm: regular rhythm    Murmur: no murmur  Lungs: No abnormalities detected in: pulmonary system, bilateral auscultation or effort  Abdomen: No abnormalities detected in: abdomen or appearance    Abnormal umbilicus: normal umbilicus    Diastasis recti: no diastasis recti    Distended abdomen: no distension    Hepatosplenomegaly: no hepatosplenomegaly  Umbilical hernia: no umbilical hernia  Neurological: No abnormalities detected in: neurological system, deep tendon reflexes, antigravity movement of extremities, strength, facial movement or tone    Hypertonia: not hypertonic    Hypotonia: not hypotonic  Genitourinary: not assessed  Hair, Nails, and Skin: No abnormalities detected in: integumentary system, hair, nails or skin    Abnormally healed scars: no  abnormally healed scars    Birthmarks: no birthmarks    Lesions: no lesions  Extremities: No abnormalities detected in: extremities    Asymmetric girth: symmetric girth    Contractures: no joint contractures    Limited range of motion: non-limited ROM   [place]aly Haldeman-Englert, MD [facility] Date: 07/22/2023 Time: 1600   Total time spent: 60 minutes Time spent includes face to face and non-face to face care for the patient on the date of this encounter (history and physical, genetic counseling, coordination of care, data gathering and/or documentation as outlined).  Genetic counselor: Philbert Brave, MS, [facility]

## 2023-07-22 NOTE — Telephone Encounter (Signed)
 Last seen on 04/16/23 No 12 month follow up scheduled

## 2023-07-23 NOTE — Progress Notes (Signed)
 GENETIC COUNSELING NEW PATIENT EVALUATION Patient name: Ashley May DOB: Sep 07, 1970 Age: 53 y.o. MRN: [id number]  Referring Provider/Specialty: Ashley Fenton, MD  Date of Evaluation: 07/22/2023 Chief Complaint/Reason for Referral: Multiple meningiomas   Brief Summary: Ashley May is a 53 y.o. female who presents today for an initial genetics evaluation for multiple meningiomas. She is unaccompanied at today's visit.  Prior genetic testing has not been performed.   Family History: See pedigree obtained during today's visit under History->Family->Pedigree.  The family history was notable for the following: Daughter, 33 yo, with negative hereditary cancer genetic testing (related to her paternal family history of cancer). Son, 52 yo, alive and well. Brother, 53 yo, with primary progressive multiple sclerosis.  Paternal Family History Father, deceased at 58 yo, with ulcerative colitis, sclerosing cholangitis, squamous cell carcinoma, and an unknown heart condition requiring a pacemaker. Grandfather, deceased from heart disease. Grandmother, deceased in her 39s with stomach cancer dx in her 33s and a reportedly baseball-sized posterior meningioma.  Maternal Family History Mother, 9 yo, with 4 brain aneurysms (first at 53 yo), aortic valve replacement, leaky mitral valve, and anemia. Uncle, 73 yo, with idiopathic pulmonary fibrosis, nephritis, and melanoma of the arm at 17 yo. His son with sudden cardiac death at 76 yo, reportedly related to an enlarged heart. Aunt, deceased at 29 yo, with idiopathic pulmonary fibrosis. Grandfather, deceased at 8 yo from suicide, with a history of heart disease and alcoholism. Grandmother, deceased, with Addison's disease.  Mother's ethnicity: [place] Father's ethnicity: Mixed European Consangunity: Denies   Prior Genetic testing: None  Genetic Counseling: Ashley May is a 53 y.o. female with multiple meningiomas. Ms.  May's first meningioma was identified at 53 yo on MRI during work-up for multiple sclerosis.  Ashley May has since been diagnosed with relapsing remitting multiple sclerosis with relatively stable symptoms since this time. At 53 yo, another meningioma was identified.  In 2023, at 53 yo, 3 additional meningiomas were identified totaling 4 in the right frontal region and 1 in the left middle cranial fossa.  She has been followed locally for her meningiomas but is currently in the process of transferring her care to [facility].  Ashley May's mother, 45 yo, has a history of 4 brain aneurysms with the first at 53 yo.  There is also a family history of idiopathic pulmonary fibrosis in her maternal aunt and uncle. Ashley May's paternal grandmother was reported have a baseball-sized posterior meningioma that was surgically removed without complication.  Genetic considerations were reviewed with Ashley May. She is aware that we have over 20,000 genes, each with an important role in the body. All of the genes are packaged into structures called chromosomes. We have two copies of every chromosome- one that is inherited from each parent- and thus two copies of every gene. Given Ashley May's features, concern for a genetic cause of her symptoms has arisen. If a specific genetic abnormality can be identified, it may help provide further insight into prognosis, management, and recurrence risk.  Gene panel testing for brain and nervous system tumors is recommended given the personal and family history of meningioma.  We also discussed that it is possible that only certain cells in Ashley May's body carry a genetic variant that may lead to tumor development, for example it is possible that only her brain tissue is affected and thus would not show up on our genetic testing.  We would not recommend biopsy of her meningiomas for the purpose  of genetic testing; however, if surgical resection is ever performed as part  of her care at [facility], somatic tumor sequencing may be possible.  Ashley May is interested in pursuing this testing today Possible results (positive, negative, and variant of uncertain significance), cost, and expected timeline were reviewed.  A buccal sample was collected today from Ashley May to be sent to [facility] for the Hereditary Nervous System / Brain Cancer Panel (42 genes). Results are expected in 2-4 weeks, at which point we will reach out with more information.  Recommendations: [facility] Hereditary Nervous System / Brain Cancer Panel Continue follow-up with other healthcare providers as recommended.  Date: 07/23/2023 Total time spent: 60 minutes Genetic Counselor-only time: 25 minutes  Time spent includes face to face and non-face to face care for the patient on the date of this encounter (history, genetic counseling, coordination of care, data gathering and/or documentation as outlined).   Ashley Brave MS [facility] Certified Genetic Counselor [facility] [employer]

## 2023-07-29 LAB — GENECONNECT MOLECULAR SCREEN: Genetic Analysis Overall Interpretation: NEGATIVE

## 2023-08-05 ENCOUNTER — Encounter: Payer: Self-pay | Admitting: Internal Medicine

## 2023-08-06 ENCOUNTER — Telehealth: Payer: Self-pay | Admitting: Genetic Counselor

## 2023-08-06 NOTE — Progress Notes (Signed)
 Spoke with Ms. Abernathey regarding the results of her recent genetic testing.   Ms. Eliason was seen in the [facility] clinic on 07/22/2023 at 53 yo due to a personal history of multiple meningiomas.  After evaluation, genetic testing was ordered for Ms. Ambrose including a gene panel.   The [facility] Hereditary Nervous System / Brain Cancer Panel (+BAP1) was non-diagnostic.  At this time, we have not identified a genetic cause for Ms. Hartje 's developmental delay.  No changes to medical management are recommended based on this result.   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  Pathogenic variants in this gene are associated with Elnor Hail syndrome characterized by high risk for certain cancers including adrenocortical carcinomas, breast, brain, sarcomas, and leukemia among many others.  Ms. Vesco does not have a concerning personal or family history for this condition.  In vitro experimental studies also show that this variant is not expected to disrupt TP53 function. Given this information, we do not expect Ms. Fouty to have Elnor Hail syndrome at this time.    Ms. Tinner expressed understanding of these results and was encouraged to reach out with any further questions.  The test report has been released to the family and is attached to the associated order.   Carolynne Citron, MS [facility] Certified Genetic Counselor

## 2023-08-06 NOTE — Telephone Encounter (Signed)
 Thank you for the update. Just want to clarify if anything more I need to do at this time or for monitoring in the future.  Thank you again for the follow up.

## 2023-09-08 ENCOUNTER — Other Ambulatory Visit: Payer: Self-pay | Admitting: Neurology

## 2023-09-09 ENCOUNTER — Other Ambulatory Visit (HOSPITAL_COMMUNITY): Payer: Self-pay

## 2023-09-09 ENCOUNTER — Other Ambulatory Visit: Payer: Self-pay

## 2023-09-09 MED ORDER — AMPHETAMINE-DEXTROAMPHETAMINE 10 MG PO TABS
10.0000 mg | ORAL_TABLET | Freq: Every day | ORAL | 0 refills | Status: DC
  Filled 2023-09-09: qty 30, 30d supply, fill #0

## 2023-09-09 MED ORDER — AMPHETAMINE-DEXTROAMPHET ER 30 MG PO CP24
30.0000 mg | ORAL_CAPSULE | Freq: Every day | ORAL | 0 refills | Status: DC
Start: 2023-09-09 — End: 2024-01-20
  Filled 2023-09-09: qty 90, 90d supply, fill #0

## 2023-09-09 NOTE — Telephone Encounter (Signed)
 Last seen on 04/16/23 No follow up scheduled per note  Return in 12 months    Dispensed Days Supply Quantity Provider Pharmacy  amphetamine -dextroamphetamine  (ADDERALL  XR) 30 MG 24 hr capsule 04/21/2023 90 90 capsule Sater, Sherida Dimmer, MD [place] [facility]  amphetamine -dextroamphetamine  (ADDERALL ) 10 MG tablet 04/21/2023 30 30 tablet Sater, Sherida Dimmer, MD [place] [facility]      Rx's pending to be signed

## 2023-09-25 DIAGNOSIS — M25562 Pain in left knee: Secondary | ICD-10-CM | POA: Diagnosis not present

## 2023-10-01 DIAGNOSIS — D329 Benign neoplasm of meninges, unspecified: Secondary | ICD-10-CM | POA: Diagnosis not present

## 2023-10-07 ENCOUNTER — Ambulatory Visit: Admitting: Medical Genetics

## 2023-10-07 DIAGNOSIS — M1712 Unilateral primary osteoarthritis, left knee: Secondary | ICD-10-CM | POA: Diagnosis not present

## 2023-10-07 DIAGNOSIS — M1711 Unilateral primary osteoarthritis, right knee: Secondary | ICD-10-CM | POA: Diagnosis not present

## 2023-10-07 DIAGNOSIS — M25562 Pain in left knee: Secondary | ICD-10-CM | POA: Diagnosis not present

## 2023-10-07 DIAGNOSIS — M170 Bilateral primary osteoarthritis of knee: Secondary | ICD-10-CM | POA: Diagnosis not present

## 2023-10-09 ENCOUNTER — Other Ambulatory Visit: Payer: Self-pay | Admitting: Neurology

## 2023-10-09 ENCOUNTER — Other Ambulatory Visit (HOSPITAL_COMMUNITY): Payer: Self-pay

## 2023-10-09 MED ORDER — ONDANSETRON 4 MG PO TBDP
4.0000 mg | ORAL_TABLET | Freq: Three times a day (TID) | ORAL | 5 refills | Status: AC | PRN
  Filled 2023-10-09: qty 20, 7d supply, fill #0
  Filled 2023-11-20: qty 20, 7d supply, fill #1
  Filled 2024-01-05: qty 20, 7d supply, fill #2
  Filled 2024-03-09 – 2024-03-29 (×2): qty 20, 7d supply, fill #3

## 2023-10-14 ENCOUNTER — Other Ambulatory Visit (HOSPITAL_COMMUNITY): Payer: Self-pay

## 2023-10-14 DIAGNOSIS — M25562 Pain in left knee: Secondary | ICD-10-CM | POA: Diagnosis not present

## 2023-10-14 MED ORDER — MELOXICAM 7.5 MG PO TABS
7.5000 mg | ORAL_TABLET | Freq: Every day | ORAL | 0 refills | Status: AC
Start: 2023-10-14 — End: ?
  Filled 2023-10-14: qty 10, 10d supply, fill #0

## 2023-10-21 DIAGNOSIS — M1712 Unilateral primary osteoarthritis, left knee: Secondary | ICD-10-CM | POA: Diagnosis not present

## 2023-10-28 DIAGNOSIS — M1712 Unilateral primary osteoarthritis, left knee: Secondary | ICD-10-CM | POA: Diagnosis not present

## 2023-11-20 ENCOUNTER — Other Ambulatory Visit: Payer: Self-pay | Admitting: Neurology

## 2023-11-20 ENCOUNTER — Other Ambulatory Visit: Payer: Self-pay

## 2023-11-20 ENCOUNTER — Other Ambulatory Visit (HOSPITAL_COMMUNITY): Payer: Self-pay

## 2023-11-20 MED ORDER — TOPIRAMATE 50 MG PO TABS
100.0000 mg | ORAL_TABLET | Freq: Every day | ORAL | 0 refills | Status: DC
  Filled 2023-11-20: qty 270, 90d supply, fill #0

## 2024-01-05 ENCOUNTER — Other Ambulatory Visit: Payer: Self-pay

## 2024-01-05 ENCOUNTER — Other Ambulatory Visit (HOSPITAL_COMMUNITY): Payer: Self-pay

## 2024-01-05 ENCOUNTER — Other Ambulatory Visit: Payer: Self-pay | Admitting: Neurology

## 2024-01-05 MED ORDER — AMPHETAMINE-DEXTROAMPHETAMINE 10 MG PO TABS
10.0000 mg | ORAL_TABLET | Freq: Every day | ORAL | 0 refills | Status: AC
  Filled 2024-01-05: qty 30, 30d supply, fill #0

## 2024-01-05 MED ORDER — TOPIRAMATE 50 MG PO TABS
100.0000 mg | ORAL_TABLET | Freq: Every day | ORAL | 0 refills | Status: AC
  Filled 2024-01-05: qty 270, 90d supply, fill #0

## 2024-01-05 NOTE — Telephone Encounter (Signed)
 Last seen on 04/16/23 No 1 year follow up scheduled    Dispensed Days Supply Quantity Provider Pharmacy  amphetamine -dextroamphetamine  (ADDERALL  XR) 30 MG 24 hr capsule 09/18/2023 90 90 capsule Sater, Charlie LABOR, MD [place] [facility]     Rx pending to be signed

## 2024-01-20 ENCOUNTER — Other Ambulatory Visit (HOSPITAL_COMMUNITY): Payer: Self-pay

## 2024-01-20 ENCOUNTER — Other Ambulatory Visit: Payer: Self-pay | Admitting: Neurology

## 2024-01-20 MED ORDER — AMPHETAMINE-DEXTROAMPHET ER 30 MG PO CP24
30.0000 mg | ORAL_CAPSULE | Freq: Every day | ORAL | 0 refills | Status: DC
  Filled 2024-01-20: qty 90, 90d supply, fill #0

## 2024-01-20 NOTE — Telephone Encounter (Signed)
 Last seen on 04/16/23 No 1 year follow up scheduled   Dispensed Days Supply Quantity Provider Pharmacy  amphetamine -dextroamphetamine  (ADDERALL ) 10 MG tablet 01/08/2024 30 30 tablet Sater, Charlie LABOR, MD [place] [facility]     Rx pending to be signed

## 2024-01-27 DIAGNOSIS — M25562 Pain in left knee: Secondary | ICD-10-CM | POA: Diagnosis not present

## 2024-02-10 DIAGNOSIS — M1712 Unilateral primary osteoarthritis, left knee: Secondary | ICD-10-CM | POA: Diagnosis not present

## 2024-03-09 ENCOUNTER — Other Ambulatory Visit (HOSPITAL_COMMUNITY): Payer: Self-pay

## 2024-03-09 ENCOUNTER — Other Ambulatory Visit: Payer: Self-pay | Admitting: Neurology

## 2024-03-09 ENCOUNTER — Other Ambulatory Visit: Payer: Self-pay

## 2024-03-10 ENCOUNTER — Other Ambulatory Visit (HOSPITAL_COMMUNITY): Payer: Self-pay

## 2024-03-10 MED ORDER — AMPHETAMINE-DEXTROAMPHET ER 30 MG PO CP24
30.0000 mg | ORAL_CAPSULE | Freq: Every day | ORAL | 0 refills | Status: AC
  Filled 2024-03-22: qty 90, 90d supply, fill #0

## 2024-03-10 NOTE — Telephone Encounter (Signed)
 Last seen on 04/16/23 No 1 year follow up scheduled   Last dispensed: 01/21/2024)   Rx pending to be signed

## 2024-03-12 ENCOUNTER — Encounter: Payer: Self-pay | Admitting: Internal Medicine

## 2024-03-12 NOTE — Telephone Encounter (Signed)
 Called and spoke to [place]

## 2024-03-19 ENCOUNTER — Other Ambulatory Visit (HOSPITAL_COMMUNITY): Payer: Self-pay

## 2024-03-22 ENCOUNTER — Other Ambulatory Visit (HOSPITAL_COMMUNITY): Payer: Self-pay

## 2024-03-22 ENCOUNTER — Other Ambulatory Visit: Payer: Self-pay

## 2024-03-29 ENCOUNTER — Other Ambulatory Visit (HOSPITAL_COMMUNITY): Payer: Self-pay

## 2024-05-24 ENCOUNTER — Encounter: Payer: Commercial Managed Care - PPO | Admitting: Internal Medicine
